# Patient Record
Sex: Female | Born: 1975 | Hispanic: No | Marital: Married | Smoking: Never smoker
Health system: Southern US, Community
[De-identification: ages and names within clinical notes are randomized; demographics above are authoritative.]

## PROBLEM LIST (undated history)

## (undated) DIAGNOSIS — E039 Hypothyroidism, unspecified: Secondary | ICD-10-CM

## (undated) DIAGNOSIS — T8859XA Other complications of anesthesia, initial encounter: Secondary | ICD-10-CM

## (undated) HISTORY — PX: REDUCTION MAMMAPLASTY: SUR839

## (undated) HISTORY — PX: OTHER SURGICAL HISTORY: SHX169

## (undated) HISTORY — PX: NASAL SEPTUM SURGERY: SHX37

## (undated) HISTORY — PX: MASTECTOMY: SHX3

---

## 2008-11-24 ENCOUNTER — Encounter: Admission: RE | Admit: 2008-11-24 | Discharge: 2008-12-14 | Payer: Self-pay | Admitting: Internal Medicine

## 2009-01-12 ENCOUNTER — Ambulatory Visit (HOSPITAL_COMMUNITY): Admission: RE | Admit: 2009-01-12 | Discharge: 2009-01-12 | Payer: Self-pay | Admitting: Gynecology

## 2009-01-12 IMAGING — RF DG HYSTEROGRAM
7 series · 7 of 7 positions shown · IV contrast (omnipaque)
Comparison: none

CLINICAL DATA: Infertility

HYSTEROSALPINGOGRAM
TECHNIQUE: Following cleansing of the cervix and vagina with
Betadine solution, a hysterosalpingogram was performed using a 5-
French hysterosalpingogram catheter and Omnipaque 300 contrast. The
patient tolerated the examination without difficulty. The exam was
performed by Dr. GITA SAKHE, and seven images are submitted for
interpretation.

[Series 1: run · 1 of 1 slices shown (1 of 7)]
[im 1/1]
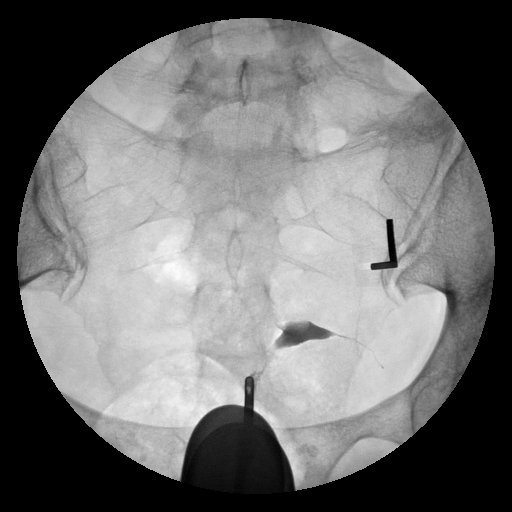

[Series 2: run · 1 of 1 slices shown (2 of 7)]
[im 1/1]
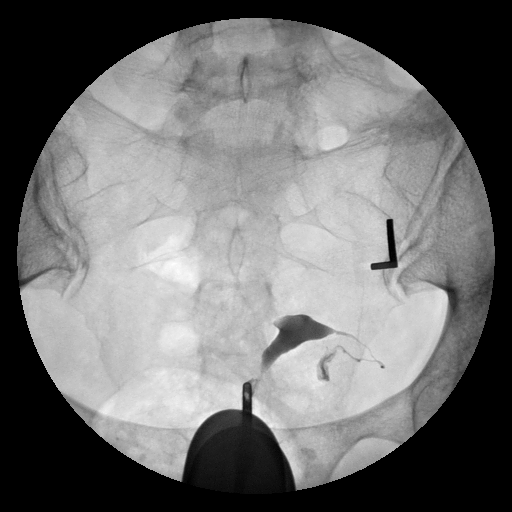

[Series 3: run · 1 of 1 slices shown (3 of 7)]
[im 1/1]
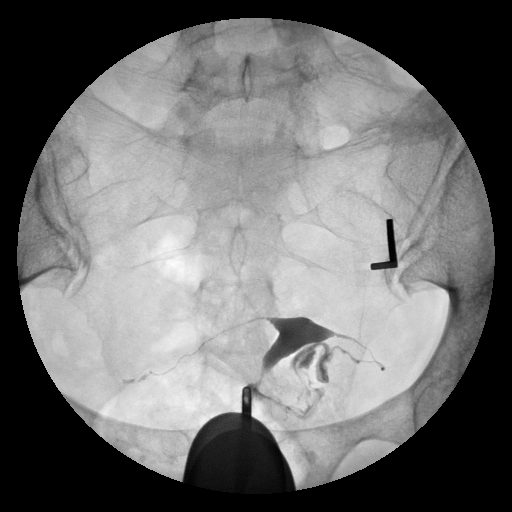

[Series 4: run · 1 of 1 slices shown (4 of 7)]
[im 1/1]
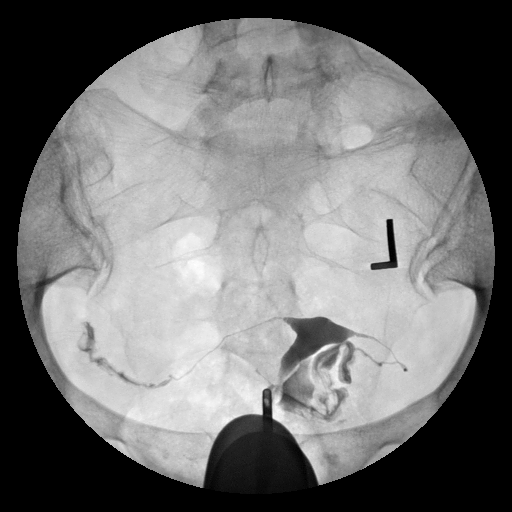

[Series 5: run · 1 of 1 slices shown (5 of 7)]
[im 1/1]
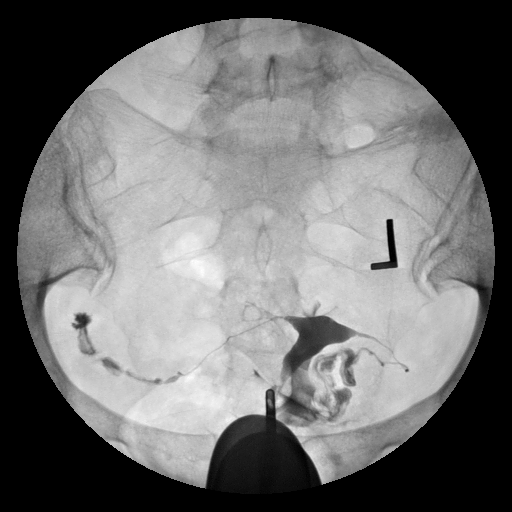

[Series 6: run · 1 of 1 slices shown (6 of 7)]
[im 1/1]
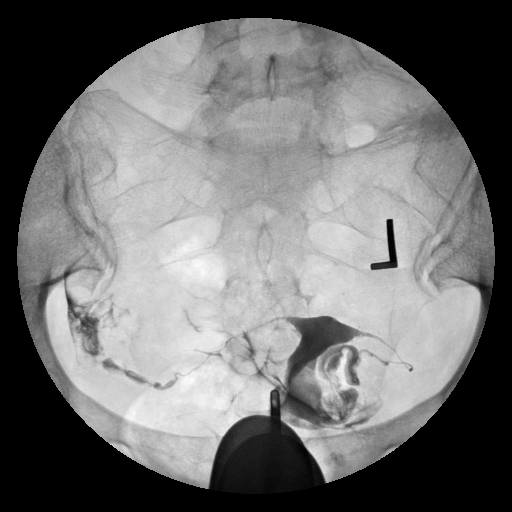

[Series 7: run · 1 of 1 slices shown (7 of 7)]
[im 1/1]
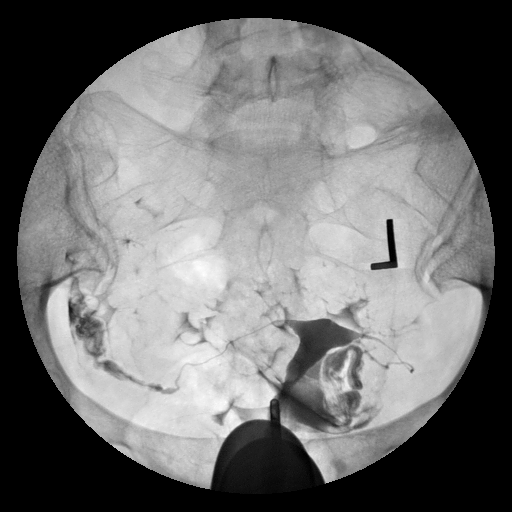

[7 of 7 positions shown; findings below may reference images not displayed]

FINDINGS: There are no fixed filling defects within the uterine
cavity.  Both fallopian tubes have a normal appearance.  There is
free spill of contrast into both adnexal regions.
IMPRESSION: Normal HSG.

## 2009-10-01 ENCOUNTER — Encounter: Admission: RE | Admit: 2009-10-01 | Discharge: 2009-10-01 | Payer: Self-pay | Admitting: Obstetrics and Gynecology

## 2009-10-01 IMAGING — MG MM SCREEN MAMMOGRAM BILATERAL
4 series · 4 of 4 positions shown · non-contrast
Comparison: none

DG SCREEN MAMMOGRAM BILATERAL
Bilateral CC and MLO view(s) were taken.

DIGITAL SCREENING MAMMOGRAM WITH CAD:
The breast tissue is heterogeneously dense.  No masses or malignant type calcifications are 
identified.
Images were processed with CAD.

[R CC]
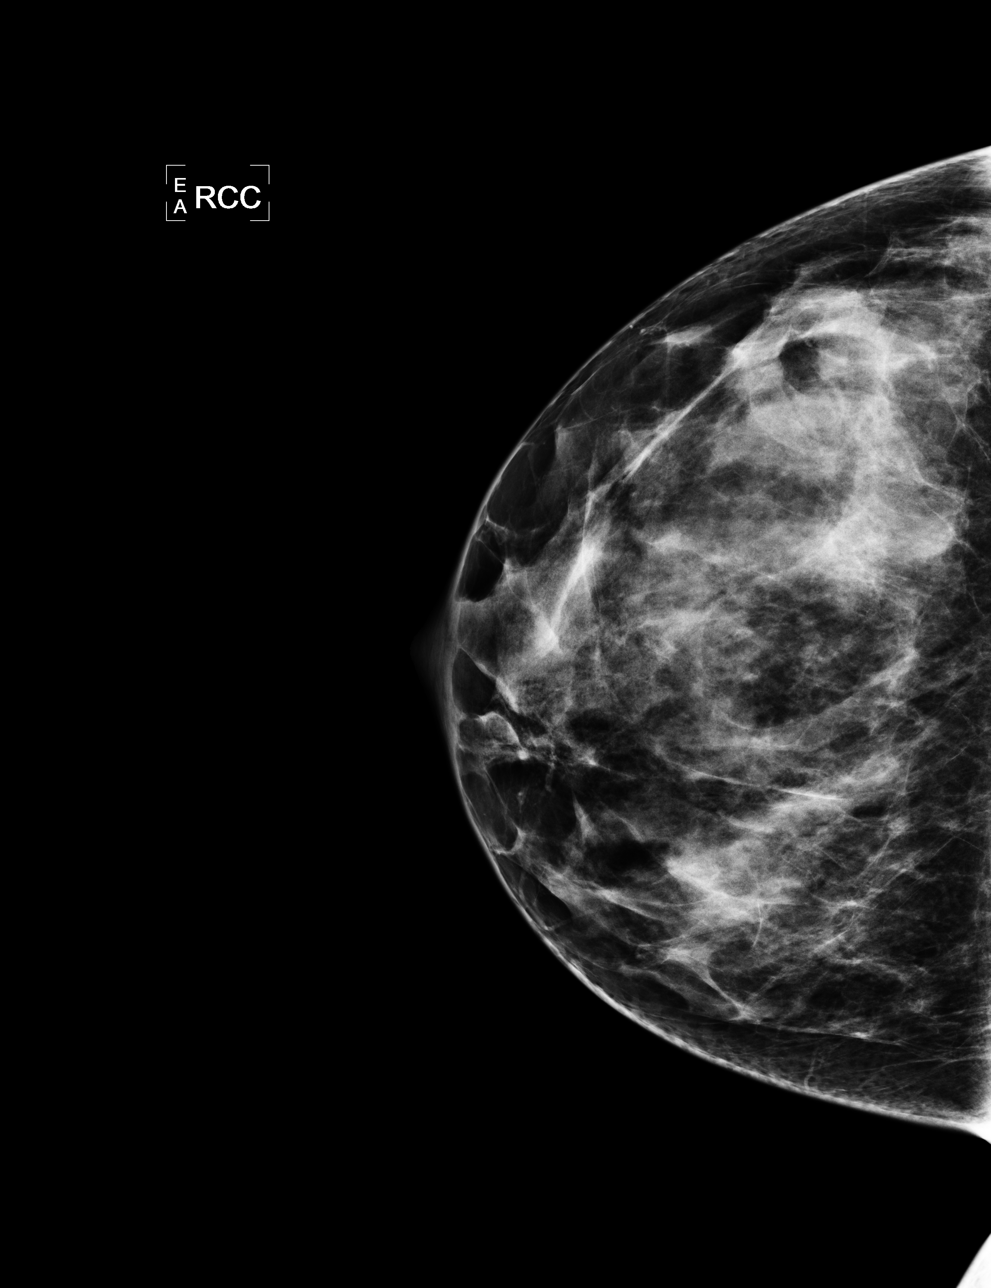

[L CC]
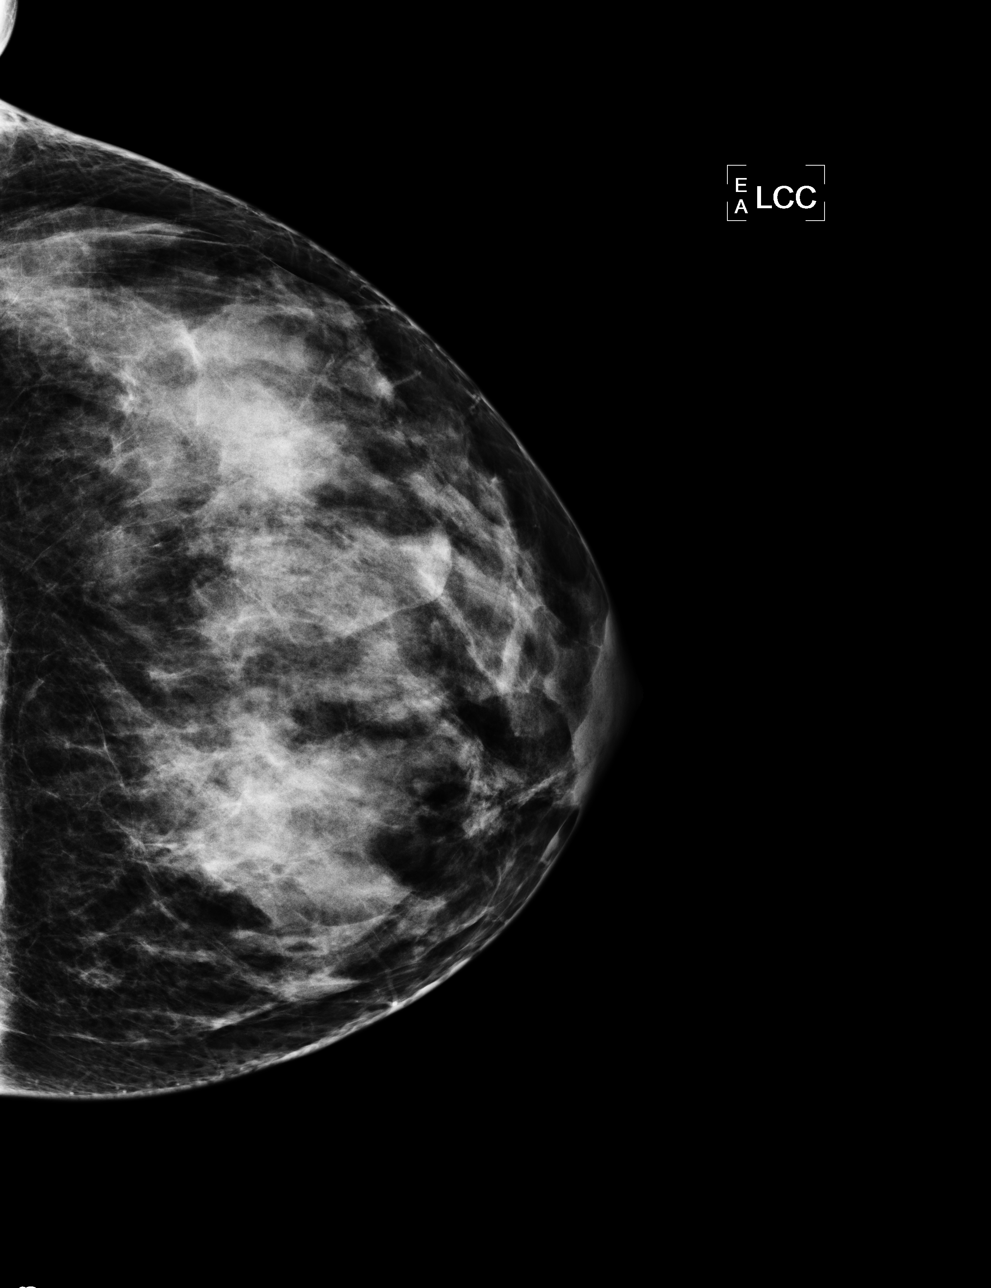

[L MLO]
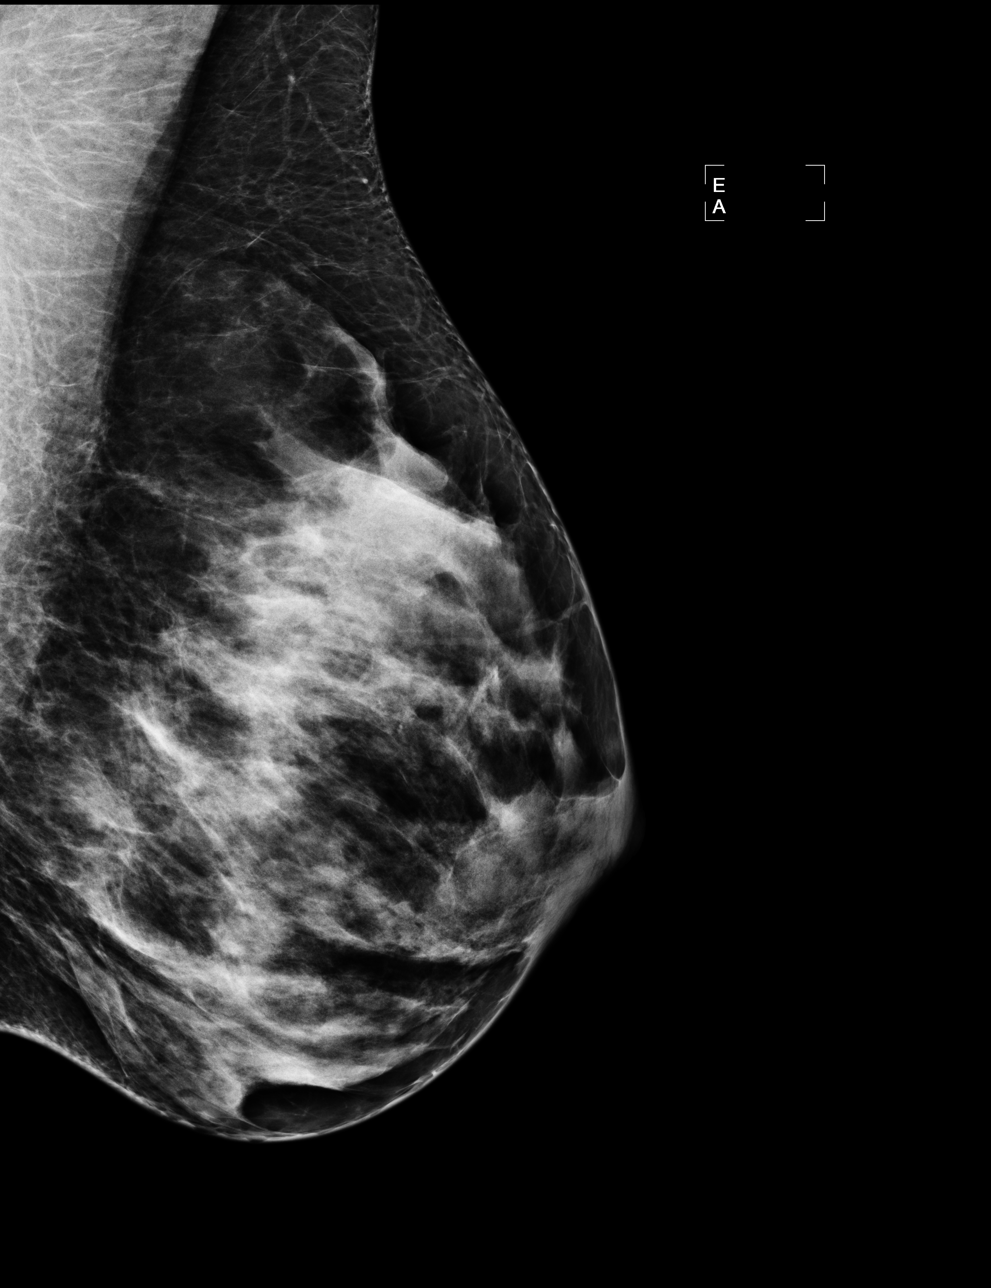

[R MLO]
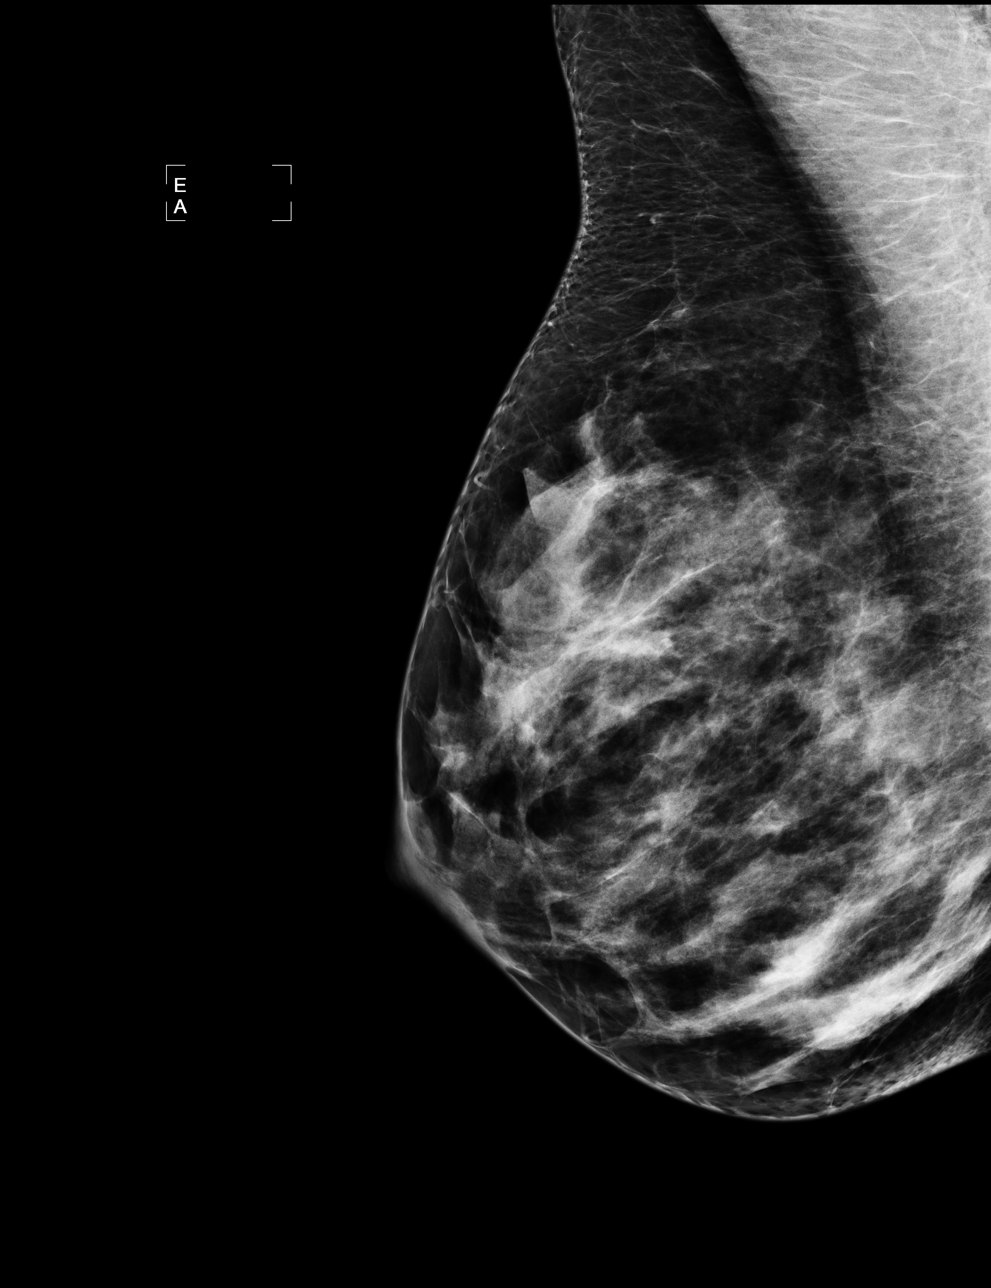

[4 of 4 positions shown; findings below may reference images not displayed]

IMPRESSION: No specific mammographic evidence of malignancy.  Next screening mammogram is recommended in one 
year.

A result letter of this screening mammogram will be mailed directly to the patient.

ASSESSMENT: Negative - BI-RADS 1

Screening mammogram in 1 year.
,

## 2010-08-10 ENCOUNTER — Ambulatory Visit (HOSPITAL_COMMUNITY): Admission: RE | Admit: 2010-08-10 | Discharge: 2010-08-10 | Payer: Self-pay | Admitting: Obstetrics and Gynecology

## 2010-10-12 ENCOUNTER — Encounter: Admission: RE | Admit: 2010-10-12 | Discharge: 2010-10-12 | Payer: Self-pay | Admitting: Obstetrics and Gynecology

## 2010-10-12 IMAGING — MG MM DIGITAL SCREENING
4 series · 4 of 4 positions shown · non-contrast
Comparison: none

DG SCREEN MAMMOGRAM BILATERAL
Bilateral CC and MLO view(s) were taken.

DIGITAL SCREENING MAMMOGRAM WITH CAD:
The breast tissue is extremely dense.  No masses or malignant type calcifications are identified.  
Compared with prior studies.
Images were processed with CAD.

[R CC]
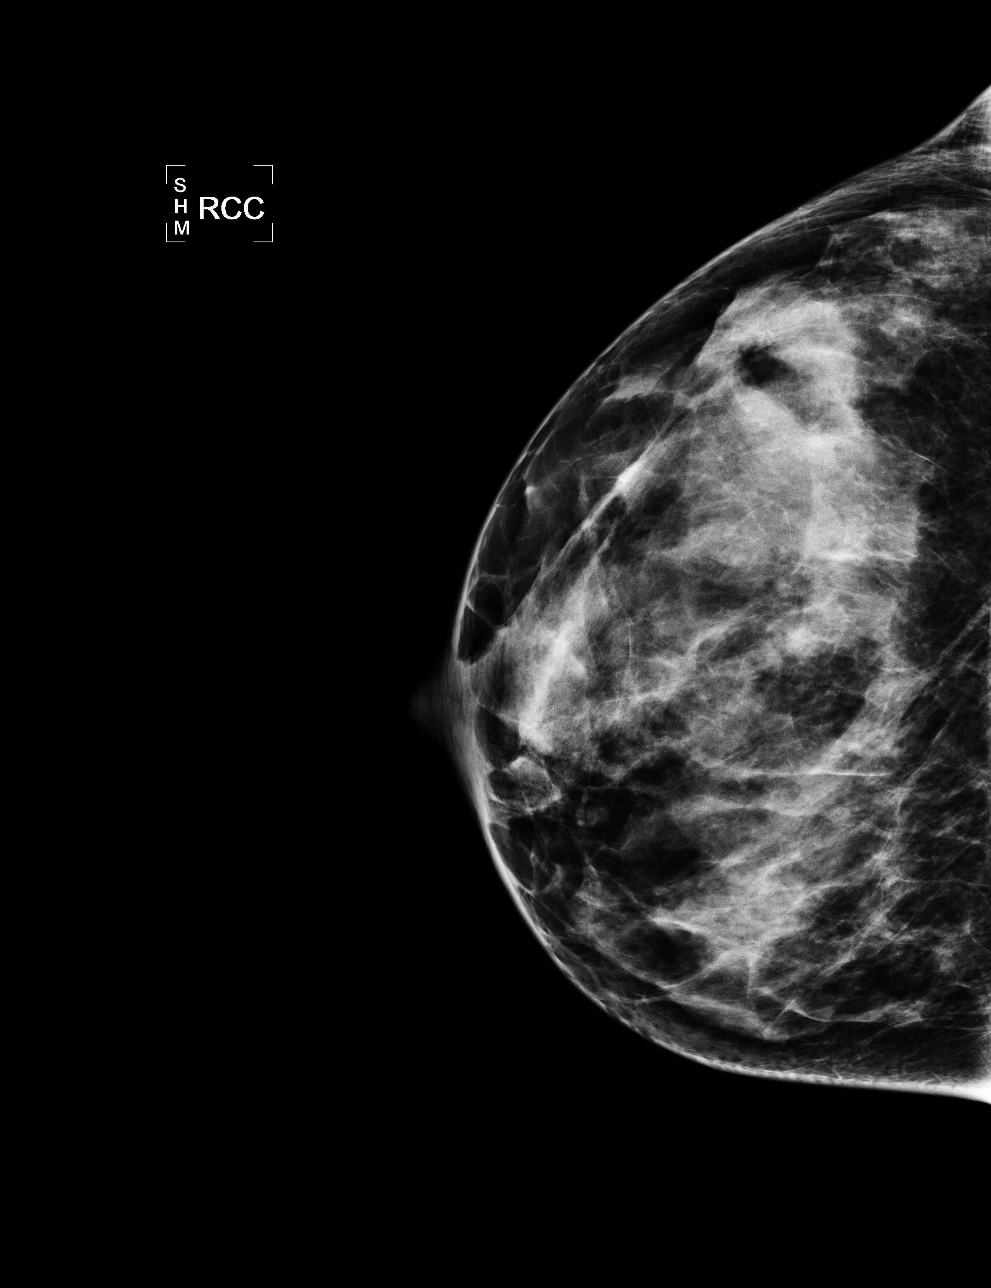

[L CC]
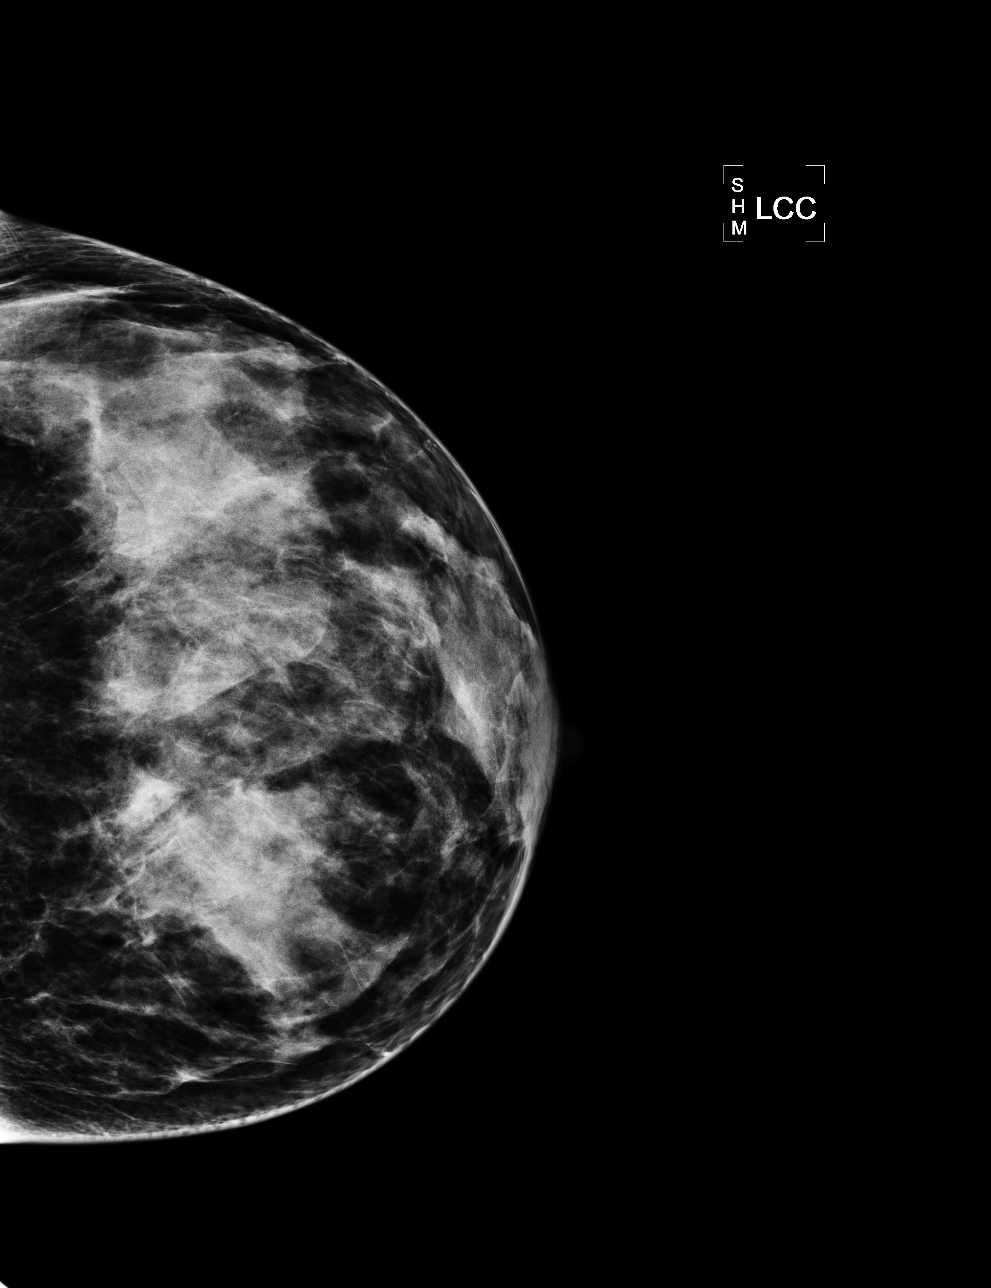

[L MLO]
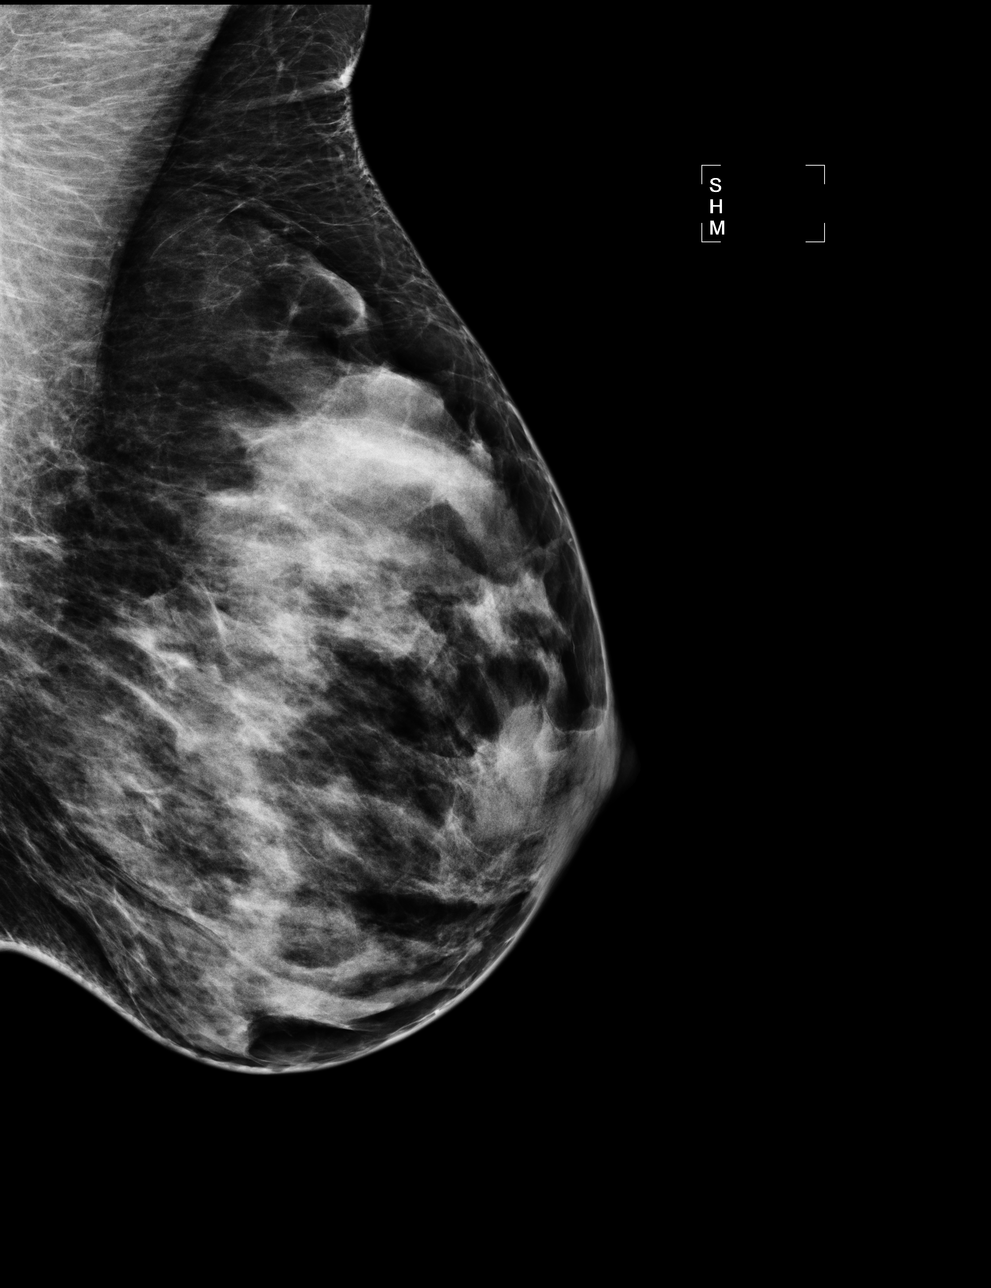

[R MLO]
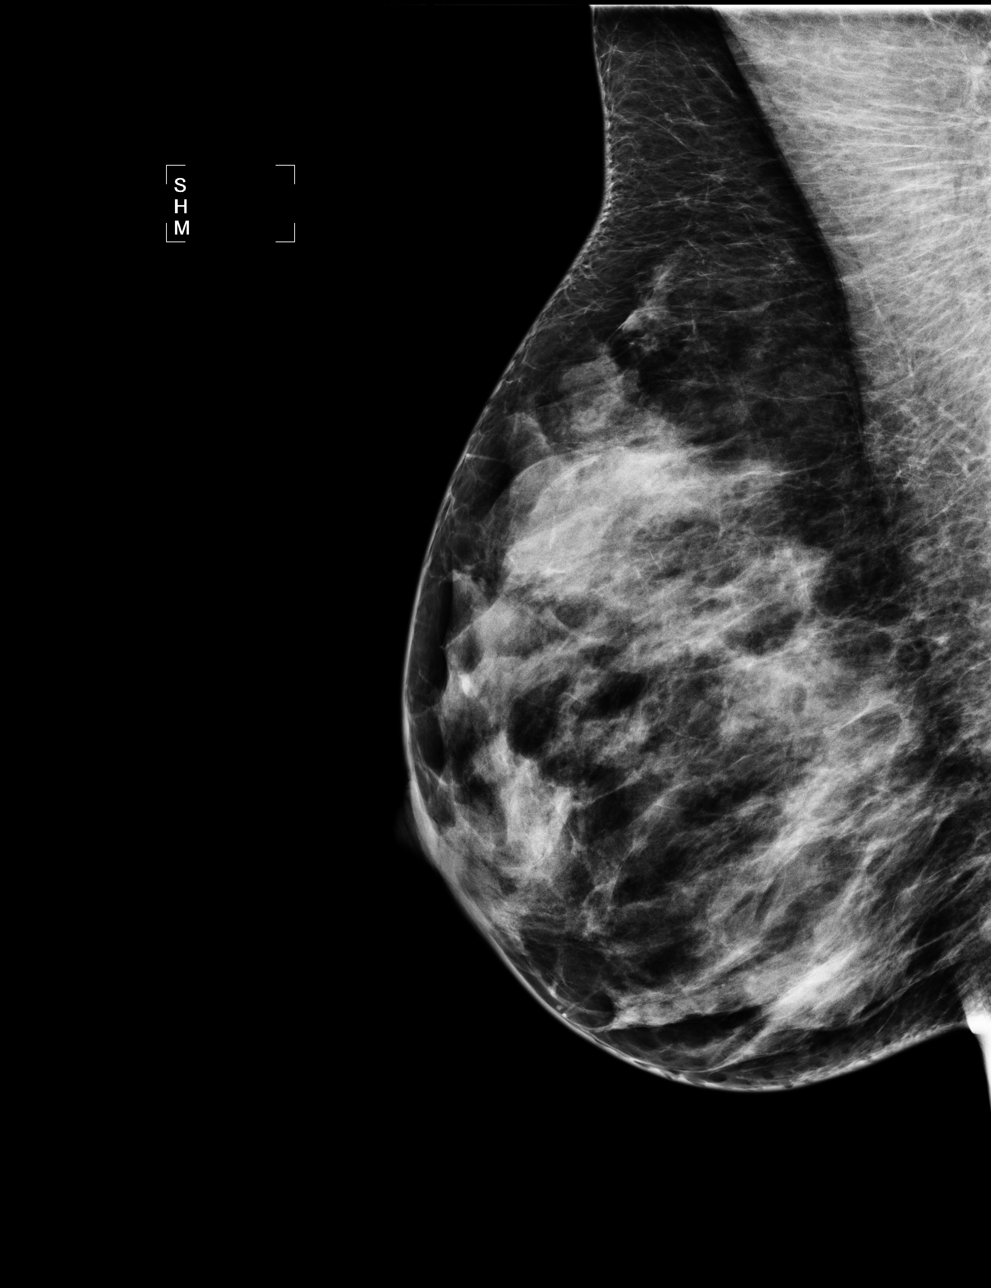

[4 of 4 positions shown; findings below may reference images not displayed]

IMPRESSION: No specific mammographic evidence of malignancy.  Next screening mammogram is recommended in one 
year.  Consider screening MRI every 1-2 years given the patient's high risk for breast cancer.

A result letter of this screening mammogram will be mailed directly to the patient.

ASSESSMENT: Negative - BI-RADS 1

Screening mammogram in 1 year.
,

## 2010-12-18 NOTE — L&D Delivery Note (Signed)
Delivery Note         At 12:36 PM a viable female was delivered via Vaginal, Vacuum (Extractor) (Presentation: Left Occiput Anterior).  APGAR: 9, 9; weight 8 lb 1.1 oz (3660 g).   Placenta status: , Manual removal.  Cord: 3 vessels with the following complications: Marland Kitchen Vacuum extractor due to maternal exhaustion  vtx at +3  Risk discussed including scalp hematoma or intracranial bleed.  vtx delivered easily with the second ctx used Kiwi..  Cord separated placenta manually removed.  Manual exploration of uterus reveal it to be clear.  Anesthesia: Epidural  Episiotomy: None Lacerations: 2nd degree;Perineal Suture Repair: chromic Est. Blood Loss (mL):   Mom to postpartum.  Baby to nursery-stable.  Dredyn Gubbels S 08/13/2011, 1:08 PM

## 2011-01-20 LAB — ABO/RH: RH Type: POSITIVE

## 2011-03-03 LAB — CBC
Hemoglobin: 13.5 g/dL (ref 12.0–15.0)
MCH: 33.5 pg (ref 26.0–34.0)
Platelets: 256 10*3/uL (ref 150–400)
RDW: 12.3 % (ref 11.5–15.5)
WBC: 4.3 10*3/uL (ref 4.0–10.5)

## 2011-03-03 LAB — HCG, SERUM, QUALITATIVE: Preg, Serum: NEGATIVE

## 2011-08-12 ENCOUNTER — Encounter (HOSPITAL_COMMUNITY): Payer: Self-pay | Admitting: *Deleted

## 2011-08-12 ENCOUNTER — Inpatient Hospital Stay (HOSPITAL_COMMUNITY)
Admission: AD | Admit: 2011-08-12 | Discharge: 2011-08-15 | DRG: 372 | Disposition: A | Discharge status: Home / Self Care | Payer: BC Managed Care – PPO | Source: Ambulatory Visit | Attending: Obstetrics and Gynecology | Admitting: Obstetrics and Gynecology

## 2011-08-12 DIAGNOSIS — E039 Hypothyroidism, unspecified: Secondary | ICD-10-CM | POA: Diagnosis present

## 2011-08-12 DIAGNOSIS — O09519 Supervision of elderly primigravida, unspecified trimester: Secondary | ICD-10-CM | POA: Diagnosis present

## 2011-08-12 DIAGNOSIS — E079 Disorder of thyroid, unspecified: Secondary | ICD-10-CM | POA: Diagnosis present

## 2011-08-12 HISTORY — DX: Hypothyroidism, unspecified: E03.9

## 2011-08-12 NOTE — Progress Notes (Signed)
Pt, " I have had contractions since 4 pm, and they haven't let up. I've been having chills, but my temp at home was 98.8."

## 2011-08-13 ENCOUNTER — Encounter (HOSPITAL_COMMUNITY): Payer: Self-pay | Admitting: *Deleted

## 2011-08-13 ENCOUNTER — Inpatient Hospital Stay (HOSPITAL_COMMUNITY): Payer: BC Managed Care – PPO | Admitting: Anesthesiology

## 2011-08-13 ENCOUNTER — Encounter (HOSPITAL_COMMUNITY): Payer: Self-pay | Admitting: Anesthesiology

## 2011-08-13 LAB — CBC
Hemoglobin: 13.9 g/dL (ref 12.0–15.0)
MCH: 33.2 pg (ref 26.0–34.0)
MCV: 98.8 fL (ref 78.0–100.0)
RBC: 4.19 MIL/uL (ref 3.87–5.11)

## 2011-08-13 MED ORDER — DIPHENHYDRAMINE HCL 25 MG PO CAPS: 25.0000 mg | ORAL_CAPSULE | Freq: Four times a day (QID) | ORAL | Status: DC | PRN

## 2011-08-13 MED ORDER — OXYCODONE-ACETAMINOPHEN 5-325 MG PO TABS
1.0000 | ORAL_TABLET | ORAL | Status: DC | PRN
  Filled 2011-08-13 (×4): qty 2

## 2011-08-13 MED ORDER — ONDANSETRON HCL 4 MG/2ML IJ SOLN
4.0000 mg | Freq: Four times a day (QID) | INTRAMUSCULAR | Status: DC | PRN
  Administered 2011-08-13: 4 mg via INTRAVENOUS
  Filled 2011-08-13 (×2): qty 2

## 2011-08-13 MED ORDER — PRENATAL PLUS 27-1 MG PO TABS
1.0000 | ORAL_TABLET | Freq: Every day | ORAL | Status: DC
  Administered 2011-08-13 – 2011-08-15 (×3): 1 via ORAL
  Filled 2011-08-13 (×3): qty 1

## 2011-08-13 MED ORDER — OXYCODONE-ACETAMINOPHEN 5-325 MG PO TABS: 2.0000 | ORAL_TABLET | ORAL | Status: DC | PRN

## 2011-08-13 MED ORDER — DIPHENHYDRAMINE HCL 50 MG/ML IJ SOLN: 12.5000 mg | INTRAMUSCULAR | Status: DC | PRN

## 2011-08-13 MED ORDER — EPHEDRINE 5 MG/ML INJ
10.0000 mg | INTRAVENOUS | Status: DC | PRN
  Filled 2011-08-13 (×2): qty 4

## 2011-08-13 MED ORDER — PHENYLEPHRINE 40 MCG/ML (10ML) SYRINGE FOR IV PUSH (FOR BLOOD PRESSURE SUPPORT)
80.0000 ug | PREFILLED_SYRINGE | INTRAVENOUS | Status: DC | PRN
  Filled 2011-08-13 (×2): qty 5

## 2011-08-13 MED ORDER — FLEET ENEMA 7-19 GM/118ML RE ENEM: 1.0000 | ENEMA | RECTAL | Status: DC | PRN

## 2011-08-13 MED ORDER — LIDOCAINE HCL 1.5 % IJ SOLN
INTRAMUSCULAR | Status: DC | PRN
  Administered 2011-08-13: 5 mL via EPIDURAL
  Administered 2011-08-13: 2 mL via EPIDURAL
  Administered 2011-08-13: 5 mL via EPIDURAL

## 2011-08-13 MED ORDER — LIDOCAINE HCL (PF) 1 % IJ SOLN
30.0000 mL | INTRAMUSCULAR | Status: AC | PRN
  Administered 2011-08-13: 30 mL via SUBCUTANEOUS
  Filled 2011-08-13: qty 30

## 2011-08-13 MED ORDER — SIMETHICONE 80 MG PO CHEW: 80.0000 mg | CHEWABLE_TABLET | ORAL | Status: DC | PRN

## 2011-08-13 MED ORDER — OXYTOCIN 20 UNITS IN LACTATED RINGERS INFUSION - SIMPLE: 125.0000 mL/h | INTRAVENOUS | Status: DC

## 2011-08-13 MED ORDER — ONDANSETRON HCL 4 MG/2ML IJ SOLN: 4.0000 mg | INTRAMUSCULAR | Status: DC | PRN

## 2011-08-13 MED ORDER — OXYTOCIN BOLUS FROM INFUSION
500.0000 mL | Freq: Once | INTRAVENOUS | Status: AC
  Administered 2011-08-13: 500 mL via INTRAVENOUS
  Filled 2011-08-13: qty 1000
  Filled 2011-08-13: qty 500

## 2011-08-13 MED ORDER — LACTATED RINGERS IV SOLN: 500.0000 mL | Freq: Once | INTRAVENOUS | Status: DC

## 2011-08-13 MED ORDER — WITCH HAZEL-GLYCERIN EX PADS: 1.0000 "application " | MEDICATED_PAD | CUTANEOUS | Status: DC | PRN

## 2011-08-13 MED ORDER — IBUPROFEN 600 MG PO TABS
600.0000 mg | ORAL_TABLET | Freq: Four times a day (QID) | ORAL | Status: DC | PRN
  Administered 2011-08-13: 600 mg via ORAL

## 2011-08-13 MED ORDER — BISACODYL 10 MG RE SUPP: 10.0000 mg | Freq: Every day | RECTAL | Status: DC | PRN

## 2011-08-13 MED ORDER — DIBUCAINE 1 % RE OINT
1.0000 "application " | TOPICAL_OINTMENT | RECTAL | Status: DC | PRN
  Filled 2011-08-13: qty 28

## 2011-08-13 MED ORDER — BENZOCAINE-MENTHOL 20-0.5 % EX AERO
INHALATION_SPRAY | CUTANEOUS | Status: AC
  Filled 2011-08-13: qty 56

## 2011-08-13 MED ORDER — PHENYLEPHRINE 40 MCG/ML (10ML) SYRINGE FOR IV PUSH (FOR BLOOD PRESSURE SUPPORT)
80.0000 ug | PREFILLED_SYRINGE | INTRAVENOUS | Status: DC | PRN
  Filled 2011-08-13: qty 5

## 2011-08-13 MED ORDER — LEVOTHYROXINE SODIUM 25 MCG PO TABS
25.0000 ug | ORAL_TABLET | ORAL | Status: DC
  Administered 2011-08-14 – 2011-08-15 (×2): 25 ug via ORAL
  Filled 2011-08-13 (×2): qty 1

## 2011-08-13 MED ORDER — SENNOSIDES-DOCUSATE SODIUM 8.6-50 MG PO TABS
2.0000 | ORAL_TABLET | Freq: Every day | ORAL | Status: DC
  Administered 2011-08-14 (×2): 2 via ORAL

## 2011-08-13 MED ORDER — CITRIC ACID-SODIUM CITRATE 334-500 MG/5ML PO SOLN: 30.0000 mL | ORAL | Status: DC | PRN

## 2011-08-13 MED ORDER — LANOLIN HYDROUS EX OINT: TOPICAL_OINTMENT | CUTANEOUS | Status: DC | PRN

## 2011-08-13 MED ORDER — ZOLPIDEM TARTRATE 5 MG PO TABS: 5.0000 mg | ORAL_TABLET | Freq: Every evening | ORAL | Status: DC | PRN

## 2011-08-13 MED ORDER — IBUPROFEN 600 MG PO TABS
600.0000 mg | ORAL_TABLET | Freq: Four times a day (QID) | ORAL | Status: DC
  Administered 2011-08-13 – 2011-08-15 (×6): 600 mg via ORAL
  Filled 2011-08-13 (×7): qty 1

## 2011-08-13 MED ORDER — ONDANSETRON HCL 4 MG PO TABS: 4.0000 mg | ORAL_TABLET | ORAL | Status: DC | PRN

## 2011-08-13 MED ORDER — BENZOCAINE-MENTHOL 20-0.5 % EX AERO: 1.0000 "application " | INHALATION_SPRAY | CUTANEOUS | Status: DC | PRN

## 2011-08-13 MED ORDER — ACETAMINOPHEN 325 MG PO TABS: 650.0000 mg | ORAL_TABLET | ORAL | Status: DC | PRN

## 2011-08-13 MED ORDER — LACTATED RINGERS IV SOLN
INTRAVENOUS | Status: DC
  Administered 2011-08-13: 02:00:00 via INTRAVENOUS
  Administered 2011-08-13: 999 mL/h via INTRAVENOUS
  Administered 2011-08-13: 09:00:00 via INTRAVENOUS

## 2011-08-13 MED ORDER — LEVOTHYROXINE SODIUM 25 MCG PO TABS: 25.0000 ug | ORAL_TABLET | Freq: Every day | ORAL | Status: DC

## 2011-08-13 MED ORDER — LEVOTHYROXINE SODIUM 50 MCG PO TABS: 50.0000 ug | ORAL_TABLET | ORAL | Status: DC

## 2011-08-13 MED ORDER — LACTATED RINGERS IV SOLN: 500.0000 mL | INTRAVENOUS | Status: DC | PRN

## 2011-08-13 MED ORDER — EPHEDRINE 5 MG/ML INJ
10.0000 mg | INTRAVENOUS | Status: DC | PRN
  Administered 2011-08-13: 10 mg via INTRAVENOUS
  Filled 2011-08-13: qty 4

## 2011-08-13 MED ORDER — TETANUS-DIPHTH-ACELL PERTUSSIS 5-2.5-18.5 LF-MCG/0.5 IM SUSP: 0.5000 mL | Freq: Once | INTRAMUSCULAR | Status: DC

## 2011-08-13 MED ORDER — PRENATAL PLUS 27-1 MG PO TABS: 1.0000 | ORAL_TABLET | Freq: Every day | ORAL | Status: DC

## 2011-08-13 MED ORDER — FENTANYL 2.5 MCG/ML BUPIVACAINE 1/10 % EPIDURAL INFUSION (WH - ANES)
14.0000 mL/h | INTRAMUSCULAR | Status: DC
  Administered 2011-08-13 (×4): 14 mL/h via EPIDURAL
  Filled 2011-08-13 (×4): qty 60

## 2011-08-13 NOTE — Anesthesia Preprocedure Evaluation (Signed)
Anesthesia Evaluation  Name, MR# and DOB Patient awake  General Assessment Comment  Reviewed: Allergy & Precautions, H&P , NPO status , Patient's Chart, lab work & pertinent test results, reviewed documented beta blocker date and time   History of Anesthesia Complications Negative for: history of anesthetic complications  Airway Mallampati: I TM Distance: >3 FB Neck ROM: full    Dental  (+) Teeth Intact   Pulmonary  clear to auscultation  breath sounds clear to auscultation none    Cardiovascular regular Normal    Neuro/Psych Negative Neurological ROS  Negative Psych ROS  GI/Hepatic/Renal negative GI ROS  negative Liver ROS  negative Renal ROS        Endo/Other  (+) Hypothyroidism,      Abdominal   Musculoskeletal   Hematology negative hematology ROS (+)   Peds  Reproductive/Obstetrics (+) Pregnancy    Anesthesia Other Findings             Anesthesia Physical Anesthesia Plan  ASA: II  Anesthesia Plan: Epidural   Post-op Pain Management:    Induction:   Airway Management Planned:   Additional Equipment:   Intra-op Plan:   Post-operative Plan:   Informed Consent: I have reviewed the patients History and Physical, chart, labs and discussed the procedure including the risks, benefits and alternatives for the proposed anesthesia with the patient or authorized representative who has indicated his/her understanding and acceptance.     Plan Discussed with:   Anesthesia Plan Comments:         Anesthesia Quick Evaluation

## 2011-08-13 NOTE — Anesthesia Procedure Notes (Signed)
Epidural Patient location during procedure: OB Start time: 08/13/2011 1:18 AM Reason for block: procedure for pain  Staffing Performed by: anesthesiologist   Preanesthetic Checklist Completed: patient identified, site marked, surgical consent, pre-op evaluation, timeout performed, IV checked, risks and benefits discussed and monitors and equipment checked  Epidural Patient position: sitting Prep: site prepped and draped and DuraPrep Patient monitoring: continuous pulse ox and blood pressure Approach: midline Injection technique: LOR air  Needle:  Needle type: Tuohy  Needle gauge: 17 G Needle length: 9 cm Needle insertion depth: 5 cm cm Catheter type: closed end flexible Catheter size: 19 Gauge Catheter at skin depth: 10 cm Test dose: negative  Assessment Events: blood not aspirated, injection not painful, no injection resistance, negative IV test and no paresthesia  Additional Notes Discussed risk of headache, infection, bleeding, nerve injury and failed or incomplete block.  Patient voices understanding and wishes to proceed.

## 2011-08-13 NOTE — Initial Assessments (Signed)
Report received from B. Cagna, RN--assumed care--pt sleeping on left side at shift change with husband at bedside

## 2011-08-13 NOTE — H&P (Signed)
Tracy Guerra is a 35 y.o. female presenting with spontaneous onset of labor.  EDC 8/28  39+ weeks NegativeGBS Maternal Medical History:  Contractions: Onset was 1-2 hours ago.   Frequency: regular.   Perceived severity is moderate.    Fetal activity: Perceived fetal activity is normal.    Prenatal Complications - Diabetes: none.    OB History    Grav Para Term Preterm Abortions TAB SAB Ect Mult Living   1              Past Medical History  Diagnosis Date  . Hypothyroidism    Past Surgical History  Procedure Date  . Nasal septum surgery   . Vaginal polyp removed    Family History: family history is not on file. Social History:  does not have a smoking history on file. She does not have any smokeless tobacco history on file. She reports that she does not drink alcohol or use illicit drugs.  ROS  Dilation: 7.5 Effacement (%): 100 Station: +1 Exam by:: B.cagna,Rn Blood pressure 124/74, pulse 85, temperature 98.5 F (36.9 C), temperature source Oral, resp. rate 16, height 5' 4.5" (1.638 m), weight 179 lb 8 oz (81.421 kg). Maternal Exam:  Uterine Assessment: Contraction strength is moderate.  Contraction frequency is regular.   Abdomen: Patient reports no abdominal tenderness. Fundal height is c/w dates.   Estimated fetal weight is 7.5.   Fetal presentation: vertex  Introitus: Amniotic fluid character: clear.  Pelvis: adequate for delivery.   Cervix: Cervix evaluated by digital exam.     Physical Exam  Prenatal labs: ABO, Rh: B/Positive/-- (02/03 0000) Antibody: Negative (02/03 0000) Rubella:   RPR:    HBsAg: Negative (02/03 0000)  HIV: Non-reactive (02/03 0000)  GBS: Negative (08/02 0000)   Assessment/Plan: IUP at term with SOL  rouetine LAnd D   Tracy Guerra S 08/13/2011, 8:08 AM

## 2011-08-14 LAB — CBC
MCH: 32.5 pg (ref 26.0–34.0)
MCHC: 33 g/dL (ref 30.0–36.0)
Platelets: 128 10*3/uL — ABNORMAL LOW (ref 150–400)
RDW: 14.1 % (ref 11.5–15.5)

## 2011-08-14 MED ORDER — BENZOCAINE-MENTHOL 20-0.5 % EX AERO
INHALATION_SPRAY | CUTANEOUS | Status: AC
  Filled 2011-08-14: qty 56

## 2011-08-14 MED ORDER — TRAMADOL HCL 50 MG PO TABS
50.0000 mg | ORAL_TABLET | Freq: Four times a day (QID) | ORAL | Status: DC | PRN
  Administered 2011-08-14 – 2011-08-15 (×4): 50 mg via ORAL
  Filled 2011-08-14 (×4): qty 1

## 2011-08-14 NOTE — Progress Notes (Signed)
Post Partum Day 1 Subjective: up ad lib, tolerating PO and complains or perineal pain. Does not want to take Percocet. Inquired if could take Ultram   Objective: Blood pressure 96/63, pulse 64, temperature 97.5 F (36.4 C), temperature source Oral, resp. rate 20, height 5' 4.5" (1.638 m), weight 81.421 kg (179 lb 8 oz), SpO2 98.00%, unknown if currently breastfeeding.  Physical Exam:  General: alert and cooperative Lochia: appropriate Uterine Fundus: firm Perineum intact, no significant labial edema. DVT Evaluation: No evidence of DVT seen on physical exam.   Basename 08/14/11 0520 08/13/11 0023  HGB 10.6* 13.9  HCT 32.1* 41.4    Assessment/Plan: Plan for discharge tomorrow Ultram 50 mg q 4 hrs prn   LOS: 2 days   CURTIS,CAROL G 08/14/2011, 9:18 AM

## 2011-08-14 NOTE — Anesthesia Postprocedure Evaluation (Signed)
  Anesthesia Post-op Note  Patient: Tracy Guerra  Procedure(s) Performed: * No procedures listed *  Patient Location: PACU and Women's Unit  Anesthesia Type: Epidural  Level of Consciousness: awake, alert  and oriented  Airway and Oxygen Therapy: Patient Spontanous Breathing  Post-op Pain: none  Post-op Assessment: Post-op Vital signs reviewed, Patient's Cardiovascular Status Stable and Pain level controlled  Post-op Vital Signs: Reviewed and stable  Complications: No apparent anesthesia complications

## 2011-08-15 MED ORDER — IBUPROFEN 600 MG PO TABS: 600.0000 mg | ORAL_TABLET | Freq: Four times a day (QID) | ORAL | Status: AC

## 2011-08-15 MED ORDER — TRAMADOL HCL 50 MG PO TABS: 50.0000 mg | ORAL_TABLET | Freq: Four times a day (QID) | ORAL | Status: AC | PRN

## 2011-08-15 NOTE — Discharge Summary (Signed)
Obstetric Discharge Summary Reason for Admission: onset of labor Prenatal Procedures: none Intrapartum Procedures: vacuum Postpartum Procedures: none Complications-Operative and Postpartum: 2 degree perineal laceration Hemoglobin  Date Value Range Status  08/14/2011 10.6* 12.0-15.0 (g/dL) Final     DELTA CHECK NOTED     REPEATED TO VERIFY     HCT  Date Value Range Status  08/14/2011 32.1* 36.0-46.0 (%) Final    Discharge Diagnoses: Term Pregnancy-delivered  Discharge Information: Date: 08/15/2011 Activity: pelvic rest Diet: routine Medications: PNV, Ibuprophen and ultram Condition: stable Instructions: refer to practice specific booklet Discharge to: home   Newborn Data: Live born female  Birth Weight: 8 lb 1.1 oz (3660 g) APGAR: 9, 9  Home with mother.  Willy Vorce G 08/15/2011, 9:34 AM

## 2011-08-15 NOTE — Progress Notes (Signed)
Post Partum Day 2 Subjective: no complaints, up ad lib, voiding, tolerating PO and + flatus  Objective: Blood pressure 123/83, pulse 67, temperature 97.7 F (36.5 C), temperature source Oral, resp. rate 18, height 5' 4.5" (1.638 m), weight 81.421 kg (179 lb 8 oz), SpO2 98.00%, unknown if currently breastfeeding.  Physical Exam:  General: alert and cooperative Lochia: appropriate Uterine Fundus: firm Perineum intact DVT Evaluation: No evidence of DVT seen on physical exam.   Basename 08/14/11 0520 08/13/11 0023  HGB 10.6* 13.9  HCT 32.1* 41.4    Assessment/Plan: Discharge home   LOS: 3 days   Velinda Wrobel G 08/15/2011, 9:26 AM

## 2011-08-20 ENCOUNTER — Inpatient Hospital Stay (HOSPITAL_COMMUNITY)
Admission: AD | Admit: 2011-08-20 | Discharge: 2011-08-20 | Disposition: A | Discharge status: Home / Self Care | Payer: BC Managed Care – PPO | Source: Ambulatory Visit | Attending: Obstetrics and Gynecology | Admitting: Obstetrics and Gynecology

## 2011-08-20 DIAGNOSIS — E039 Hypothyroidism, unspecified: Secondary | ICD-10-CM | POA: Insufficient documentation

## 2011-08-20 DIAGNOSIS — E079 Disorder of thyroid, unspecified: Secondary | ICD-10-CM | POA: Insufficient documentation

## 2011-08-20 DIAGNOSIS — O2390 Unspecified genitourinary tract infection in pregnancy, unspecified trimester: Secondary | ICD-10-CM

## 2011-08-20 DIAGNOSIS — O99893 Other specified diseases and conditions complicating puerperium: Secondary | ICD-10-CM | POA: Insufficient documentation

## 2011-08-20 DIAGNOSIS — N390 Urinary tract infection, site not specified: Secondary | ICD-10-CM

## 2011-08-20 DIAGNOSIS — R300 Dysuria: Secondary | ICD-10-CM | POA: Insufficient documentation

## 2011-08-20 DIAGNOSIS — O99285 Endocrine, nutritional and metabolic diseases complicating the puerperium: Secondary | ICD-10-CM | POA: Insufficient documentation

## 2011-08-20 DIAGNOSIS — O8620 Urinary tract infection following delivery, unspecified: Secondary | ICD-10-CM

## 2011-08-20 LAB — URINALYSIS, ROUTINE W REFLEX MICROSCOPIC
Bilirubin Urine: NEGATIVE
Ketones, ur: NEGATIVE mg/dL
Nitrite: NEGATIVE
Urobilinogen, UA: 0.2 mg/dL (ref 0.0–1.0)

## 2011-08-20 LAB — URINE MICROSCOPIC-ADD ON

## 2011-08-20 MED ORDER — CEPHALEXIN 500 MG PO CAPS: 500.0000 mg | ORAL_CAPSULE | Freq: Three times a day (TID) | ORAL | Status: AC

## 2011-08-20 NOTE — ED Provider Notes (Signed)
History     Chief Complaint  Patient presents with  . Dysuria   HPI Tracy Guerra 35 y.o.  One week postpartum having pain with urination.  Also had 5 days without a bowel movement.  On Thursday had a painful BM and had bleeding before the bowel movement.  Wants her perineal stitches checked from a 2nd degree repair 9vaginal birth).  Is concerned she has a hemorrhoid as well.  Is breastfeeding.    OB History    Grav Para Term Preterm Abortions TAB SAB Ect Mult Living   1 1 1       1       Past Medical History  Diagnosis Date  . Hypothyroidism     Past Surgical History  Procedure Date  . Nasal septum surgery   . Vaginal polyp removed     No family history on file.  History  Substance Use Topics  . Smoking status: Not on file  . Smokeless tobacco: Not on file  . Alcohol Use: No    Allergies:  Allergies  Allergen Reactions  . Avelox (Moxifloxacin Hcl In Nacl) Hives    Prescriptions prior to admission  Medication Sig Dispense Refill  . Cholecalciferol (VITAMIN D) 2000 UNITS CAPS Take 1 capsule by mouth daily.        . Docosahexaenoic Acid (DHA COMPLETE PO) Take 1 capsule by mouth daily.        Marland Kitchen ibuprofen (ADVIL,MOTRIN) 600 MG tablet Take 1 tablet (600 mg total) by mouth every 6 (six) hours.  30 tablet  1  . levothyroxine (SYNTHROID, LEVOTHROID) 25 MCG tablet Take 25-50 mcg by mouth daily. Take 25 mcg 5 times a week and 50 mcg twice a week      . NAPROXEN PO Take 1 tablet by mouth daily.        . prenatal vitamin w/FE, FA (PRENATAL 1 + 1) 27-1 MG TABS Take 1 tablet by mouth daily.       . traMADol (ULTRAM) 50 MG tablet Take 1 tablet (50 mg total) by mouth every 6 (six) hours as needed.  30 tablet  0    Review of Systems  Constitutional: Negative for fever.  Genitourinary: Positive for dysuria.   Physical Exam   Blood pressure 128/91, pulse 77, temperature 97.9 F (36.6 C), temperature source Oral, resp. rate 20, height 5' 4.5" (1.638 m), weight 162 lb 6.4 oz  (73.664 kg), unknown if currently breastfeeding.  Physical Exam  Nursing note and vitals reviewed. Constitutional: She is oriented to person, place, and time. She appears well-developed and well-nourished.  HENT:  Head: Normocephalic.  Eyes: EOM are normal.  Neck: Neck supple.  Genitourinary:       No hemmorroids Perineum normal to visual inspection, but with palpation of tissues, outer layer in center of perineum opens and sutures visualized clustered on both sides.  Small amount of lochia seen coming from vagina.  No active bleeding from perineal repair site.  Musculoskeletal: Normal range of motion.  Neurological: She is alert and oriented to person, place, and time.  Skin: Skin is warm and dry.  Psychiatric: She has a normal mood and affect.    MAU Course  Procedures Results for orders placed during the hospital encounter of 08/20/11 (from the past 24 hour(s))  URINALYSIS, ROUTINE W REFLEX MICROSCOPIC     Status: Abnormal   Collection Time   08/20/11  1:30 AM      Component Value Range   Color, Urine YELLOW  YELLOW    Appearance CLEAR  CLEAR    Specific Gravity, Urine 1.010  1.005 - 1.030    pH 7.0  5.0 - 8.0    Glucose, UA NEGATIVE  NEGATIVE (mg/dL)   Hgb urine dipstick LARGE (*) NEGATIVE    Bilirubin Urine NEGATIVE  NEGATIVE    Ketones, ur NEGATIVE  NEGATIVE (mg/dL)   Protein, ur NEGATIVE  NEGATIVE (mg/dL)   Urobilinogen, UA 0.2  0.0 - 1.0 (mg/dL)   Nitrite NEGATIVE  NEGATIVE    Leukocytes, UA LARGE (*) NEGATIVE   URINE MICROSCOPIC-ADD ON     Status: Abnormal   Collection Time   08/20/11  1:30 AM      Component Value Range   Squamous Epithelial / LPF FEW (*) RARE    WBC, UA 21-50  <3 (WBC/hpf)   RBC / HPF 7-10  <3 (RBC/hpf)   Bacteria, UA FEW (*) RARE    Urine-Other MUCOUS PRESENT      MDM Consult with Dr. Marcelle Overlie re: plan of care. Perineum suspicious for tearing of sutures during difficult BM. Client to be seen in the office on Tuesday for further  evaluation. Urine culture pending.  Assessment and Plan  UTI  Plan: Rx Keflex 500 mg TID x 5 days Call the office on Tuesday morning and be seen in the office for further evaluation.   BURLESON,TERRI 08/20/2011, 2:57 AM   Nolene Bernheim, NP 08/20/11 [phone]

## 2011-08-20 NOTE — Progress Notes (Signed)
Written and verbal d/c instructions given and understanding voiced. 

## 2011-08-20 NOTE — ED Notes (Signed)
T.  Burleson NP in to discuss d/c plan with pt 

## 2011-08-20 NOTE — ED Notes (Signed)
Keflex 500mg  po #15 1 by mouth 3 x daily with food called to [facility] Dr. Wendi Snipes called in at pt's spouse's request.

## 2011-08-20 NOTE — Progress Notes (Signed)
G1P1 svd one wk ago. This evening noticed some burning with urination which is getting worse. Had 2 degree lac with delivery and passed very hard stool Thurs. Which caused some rectal bleeding.' I would like to have my stitches checked and rectum to see if I have hemorrhoids"

## 2011-08-20 NOTE — ED Notes (Signed)
0300 T. Burleson NP in with pt. Perineum examined and stitches used for 2 degree repair noted. Pt tol well

## 2011-08-20 NOTE — ED Notes (Signed)
Lilyan Punt NP in to see pt. Pt nursing baby. Spouse present and supportive.

## 2011-08-22 LAB — URINE CULTURE
Colony Count: 15000
Culture  Setup Time: 201209021056

## 2012-10-01 ENCOUNTER — Other Ambulatory Visit: Payer: Self-pay | Admitting: Obstetrics and Gynecology

## 2013-03-02 ENCOUNTER — Telehealth: Payer: Self-pay | Admitting: Internal Medicine

## 2013-03-02 NOTE — Telephone Encounter (Signed)
Cold on and off x 3 weeks with post viral cough. No relief with netti pot. Nose completely blocked. Feeling tired and feverish. Coughing sputum clear. Feels run down. Baby daughter sick  Sounds cattarh voice Runny nose Swollent but non inflamed tonsils Post nasal drip + Walking around and alert  A Sinusitis  P Doxy x 5 days [facility]   Dr. Kalman Shan, M.D., [facility].C.P Pulmonary and Critical Care Medicine Staff Physician [facility] Pager: [phone], If no answer or between  15:00h - 7:00h: call [phone]  03/02/2013 9:45 PM

## 2013-10-03 ENCOUNTER — Other Ambulatory Visit: Payer: Self-pay | Admitting: Obstetrics and Gynecology

## 2013-10-06 ENCOUNTER — Other Ambulatory Visit: Payer: Self-pay

## 2013-10-06 ENCOUNTER — Ambulatory Visit
Admission: RE | Admit: 2013-10-06 | Discharge: 2013-10-06 | Disposition: A | Discharge status: Home / Self Care | Payer: BC Managed Care – PPO | Source: Ambulatory Visit

## 2013-10-06 DIAGNOSIS — Z1231 Encounter for screening mammogram for malignant neoplasm of breast: Secondary | ICD-10-CM

## 2014-09-14 ENCOUNTER — Other Ambulatory Visit: Payer: Self-pay

## 2014-09-14 DIAGNOSIS — Z1231 Encounter for screening mammogram for malignant neoplasm of breast: Secondary | ICD-10-CM

## 2014-10-06 ENCOUNTER — Other Ambulatory Visit: Payer: Self-pay | Admitting: Obstetrics and Gynecology

## 2014-10-07 LAB — CYTOLOGY - PAP

## 2014-10-13 ENCOUNTER — Ambulatory Visit
Admission: RE | Admit: 2014-10-13 | Discharge: 2014-10-13 | Disposition: A | Discharge status: Home / Self Care | Payer: PRIVATE HEALTH INSURANCE | Source: Ambulatory Visit

## 2014-10-13 DIAGNOSIS — Z1231 Encounter for screening mammogram for malignant neoplasm of breast: Secondary | ICD-10-CM

## 2014-10-13 IMAGING — MG MM SCREENING BREAST TOMO BILATERAL
8 series · 8 of 24 positions shown · non-contrast
Comparison: Previous exam(s).

CLINICAL DATA: Screening.

EXAM:
DIGITAL SCREENING BILATERAL MAMMOGRAM WITH 3D TOMO WITH CAD

[R CC]
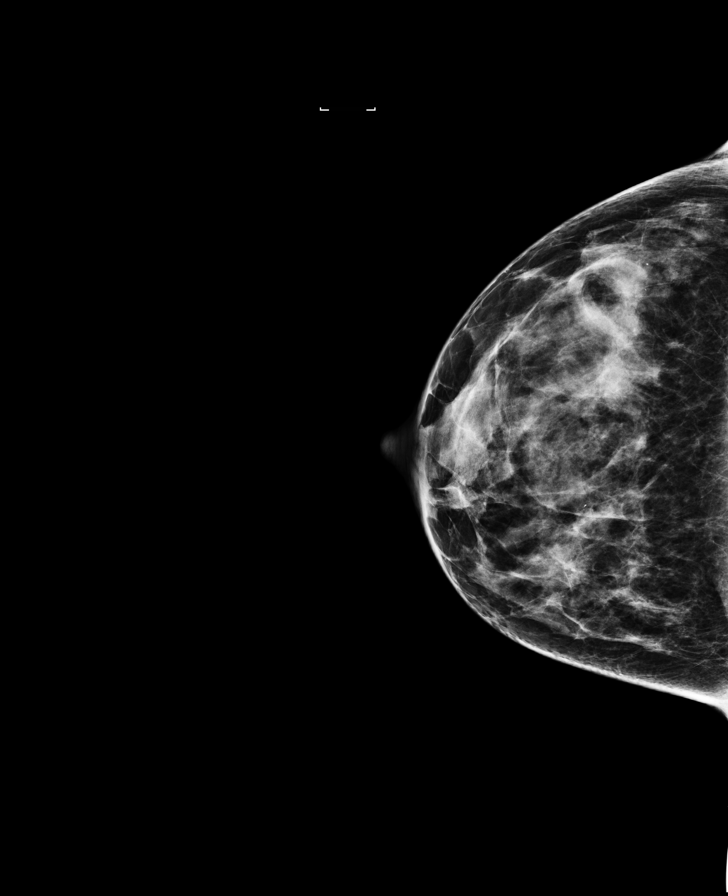

[L CC]
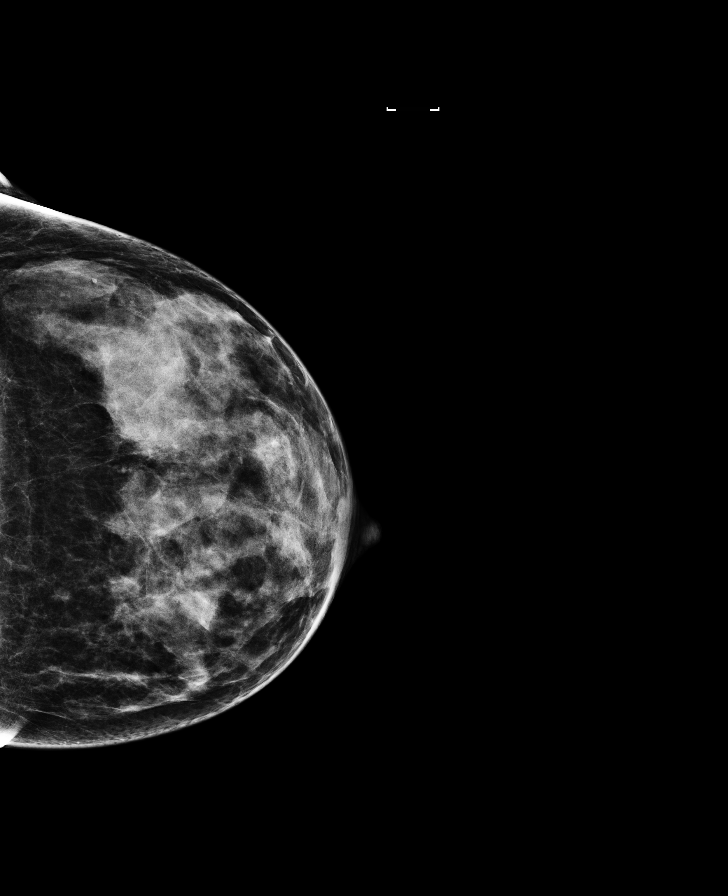

[R MLO]
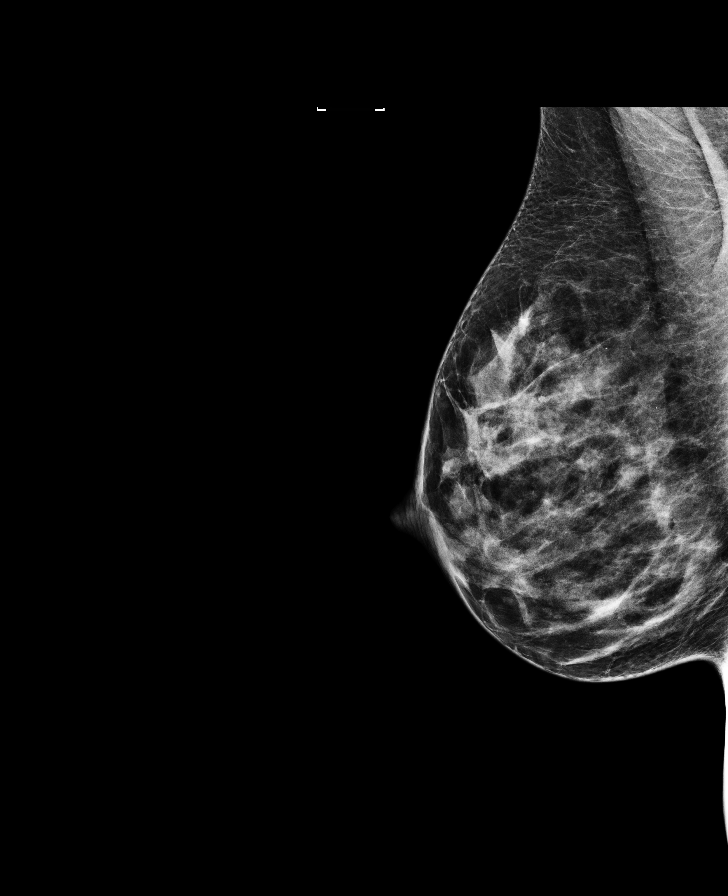

[L MLO]
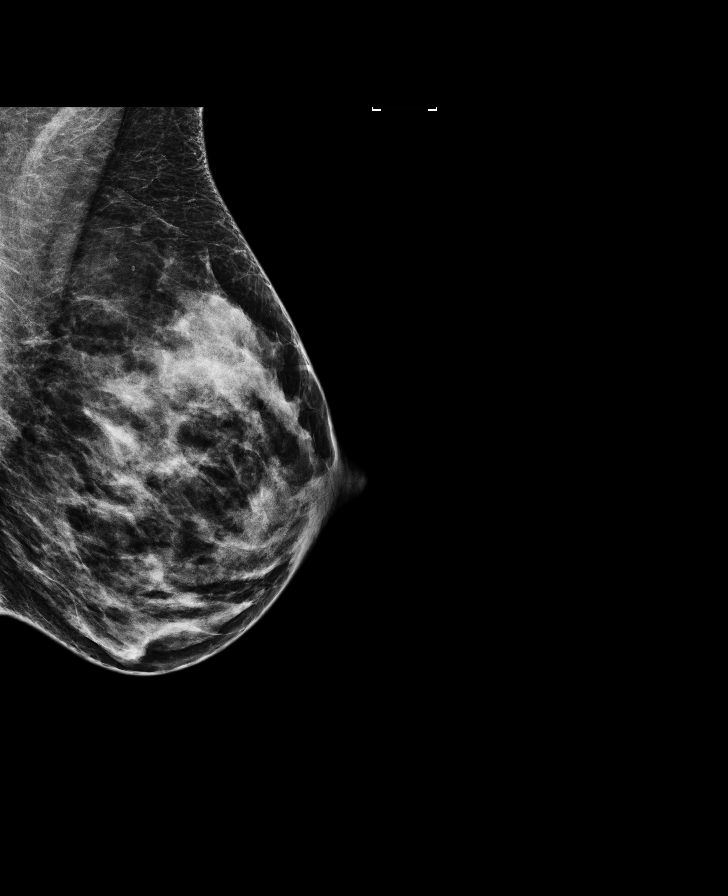

[R CC tomo · tomo slice 27/52.0]
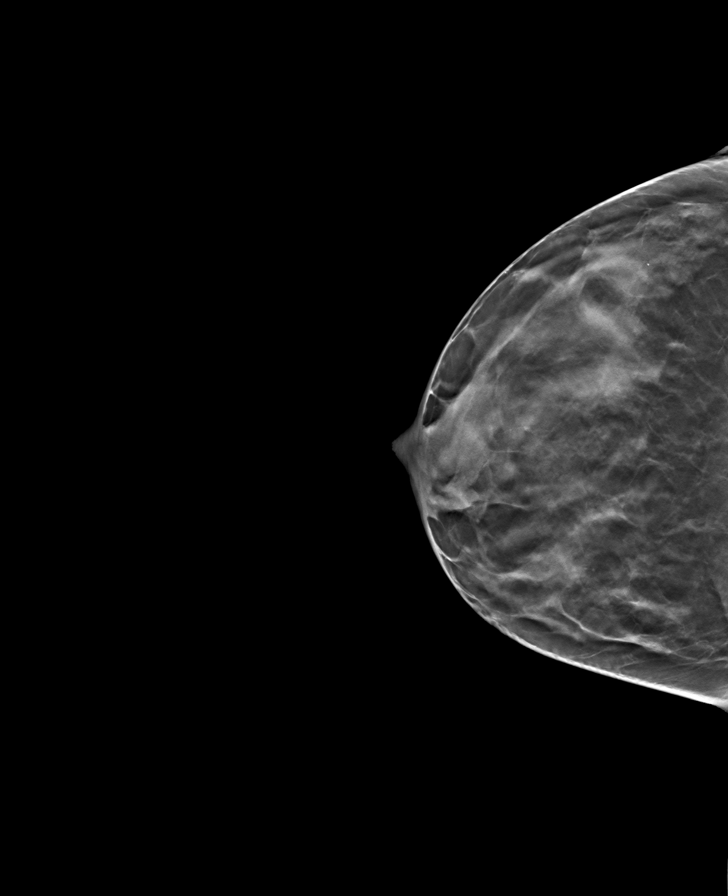

[L MLO tomo · tomo slice 30/59.0]
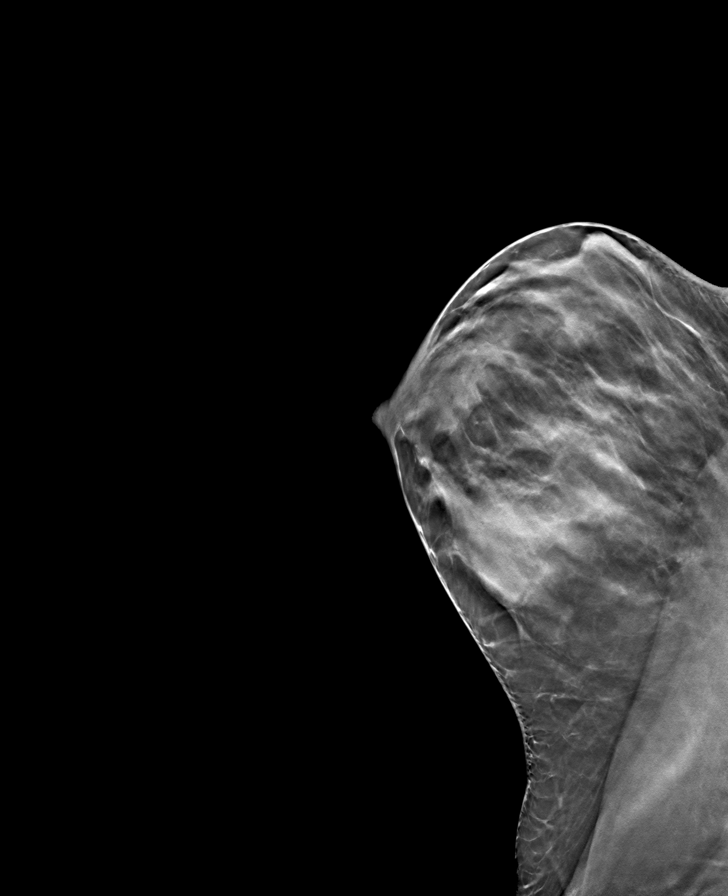

[R MLO tomo · tomo slice 29/57.0]
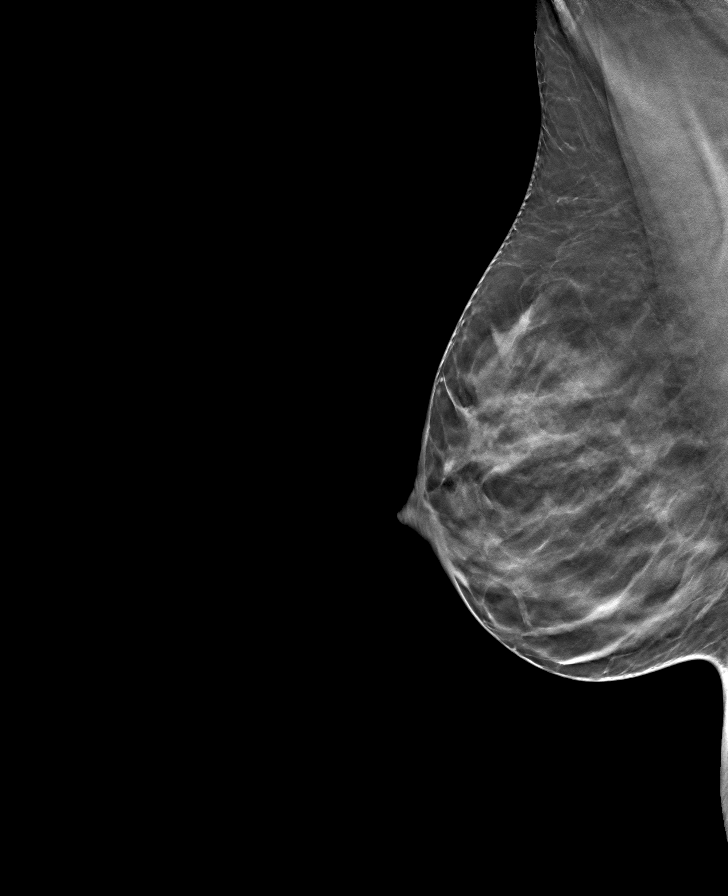

[L CC tomo · tomo slice 28/55.0]
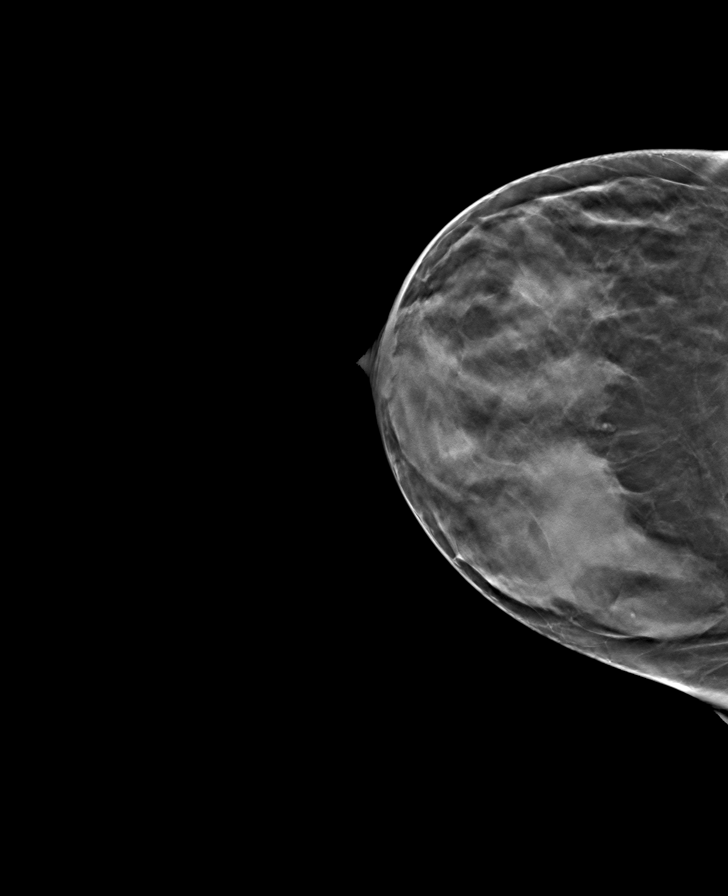

[8 of 24 positions shown; findings below may reference images not displayed]

ACR Breast Density Category c: The breast tissue is heterogeneously
dense, which may obscure small masses.
FINDINGS: There has been no significant interval change and there are no
findings suspicious for malignancy. Images were processed with CAD.
IMPRESSION: No mammographic evidence of malignancy. A result letter of this
screening mammogram will be mailed directly to the patient.

RECOMMENDATION:
Screening mammogram in one year. (Code:[ZG])

BI-RADS CATEGORY  1: Negative.

## 2014-10-19 ENCOUNTER — Encounter (HOSPITAL_COMMUNITY): Payer: Self-pay | Admitting: *Deleted

## 2015-06-30 ENCOUNTER — Other Ambulatory Visit: Payer: Self-pay | Admitting: Nurse Practitioner

## 2015-06-30 DIAGNOSIS — N644 Mastodynia: Secondary | ICD-10-CM

## 2015-07-06 ENCOUNTER — Ambulatory Visit
Admission: RE | Admit: 2015-07-06 | Discharge: 2015-07-06 | Disposition: A | Discharge status: Home / Self Care | Payer: PRIVATE HEALTH INSURANCE | Source: Ambulatory Visit | Attending: Nurse Practitioner | Admitting: Nurse Practitioner

## 2015-07-06 DIAGNOSIS — N644 Mastodynia: Secondary | ICD-10-CM

## 2015-07-06 IMAGING — MG MM DIAG BREAST TOMO UNI LEFT
5 series · 6 of 13 positions shown · non-contrast
Comparison: Previous exam(s).

CLINICAL DATA: 39-year-old female with focal pain in the
upper-outer left breast. The patient states this pain occurs
predominantly with her menstrual cycle and has been ongoing for the
past 3 months. Family history of breast cancer in the patient's
mother at age 56.

EXAM:
DIGITAL DIAGNOSTIC LEFT MAMMOGRAM WITH 3D TOMOSYNTHESIS AND CAD
LEFT BREAST ULTRASOUND

[L TAN]
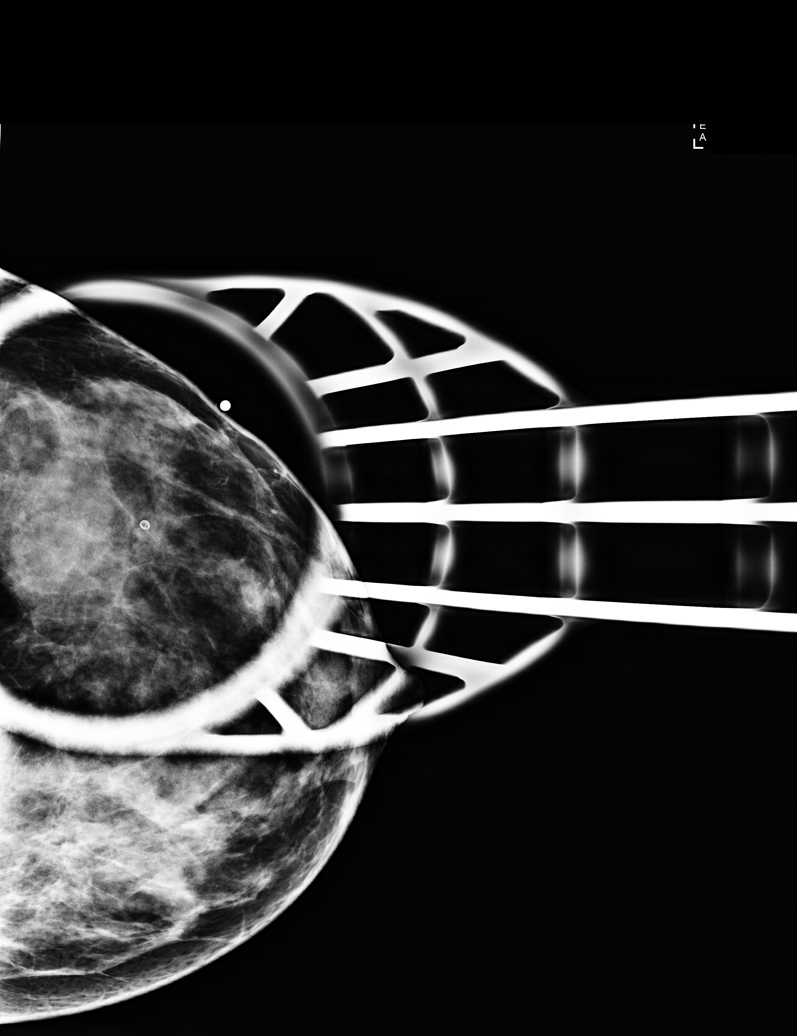

[L MLO]
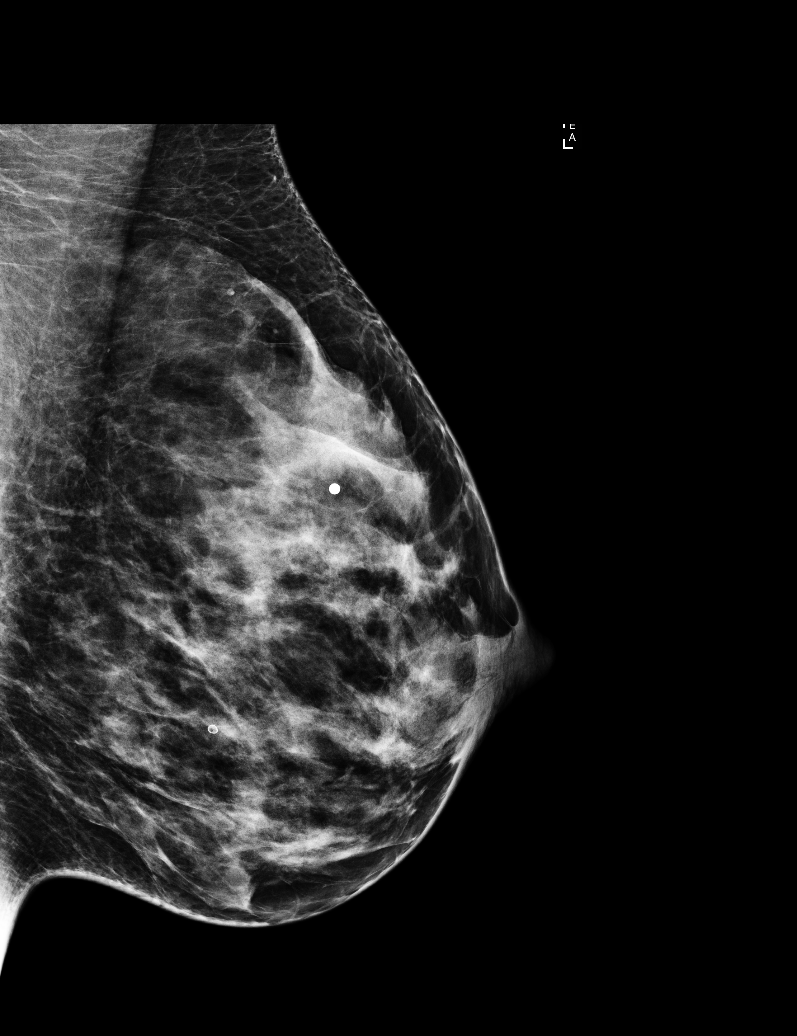

[L CC]
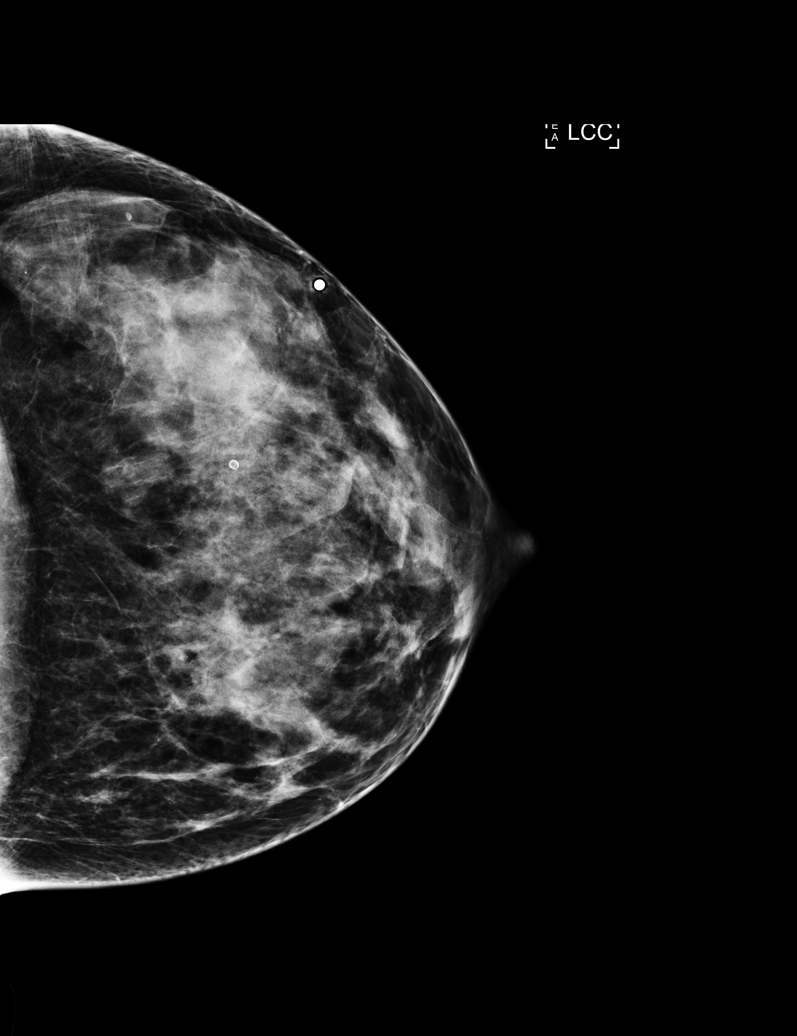

[L CC tomo · 2 of 61 frames shown]
[frame 20/61]
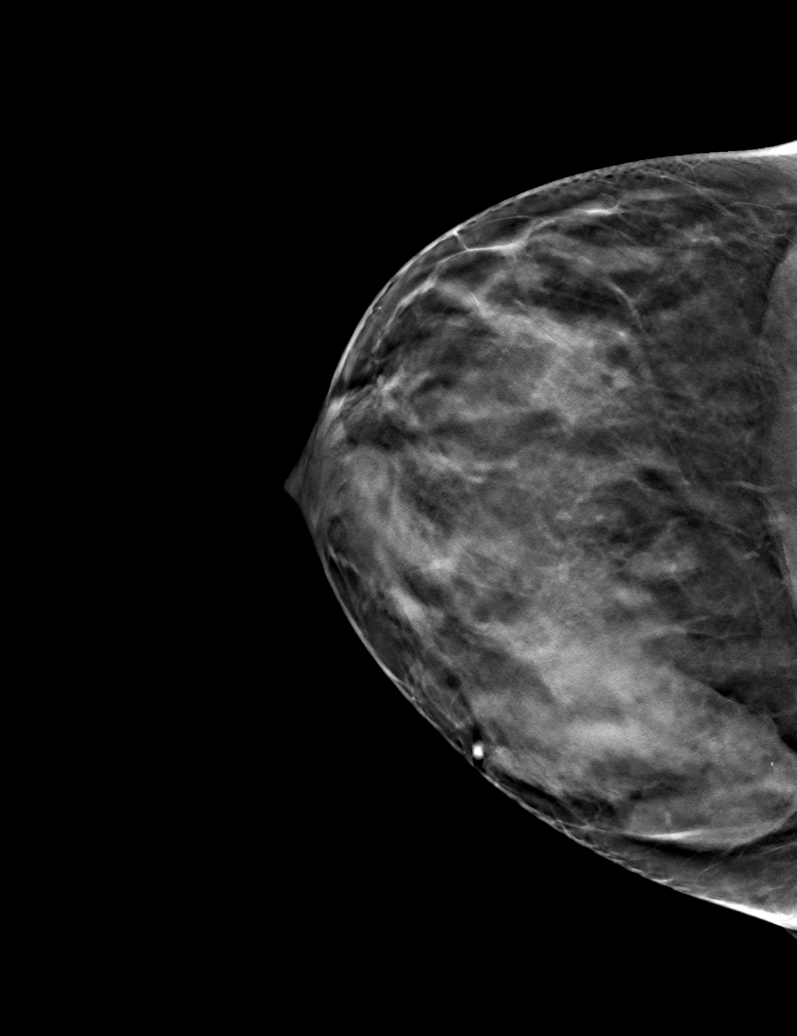
[frame 31/61]
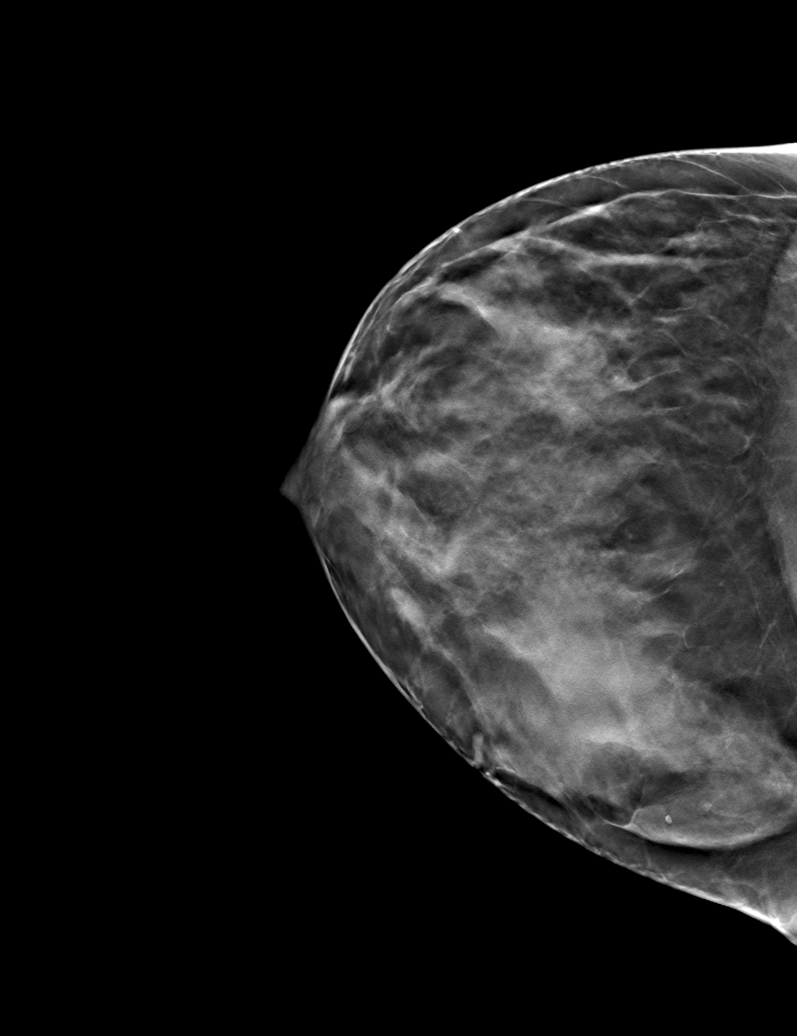

[L MLO tomo · tomo slice 31/60.0]
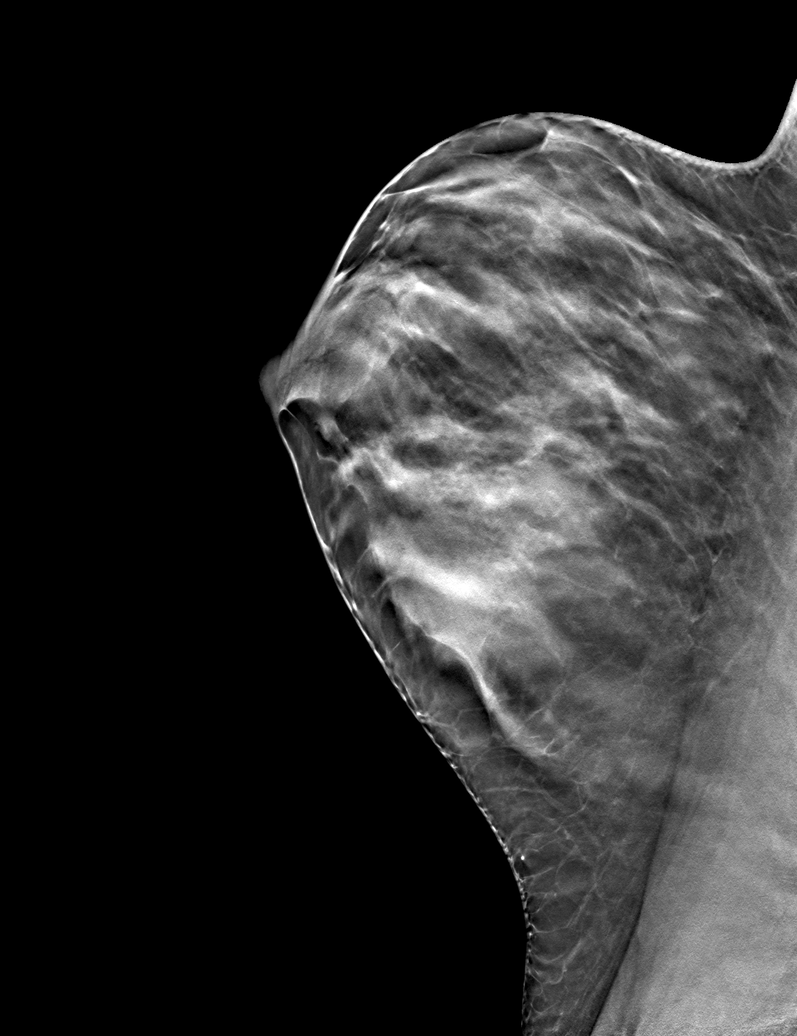

[6 of 13 positions shown; findings below may reference images not displayed]

ACR Breast Density Category c: The breast tissue is heterogeneously
dense, which may obscure small masses.
FINDINGS: No suspicious masses or calcifications are seen in the left breast.
A spot compression tangential view was performed over the painful
site of concern in the upper-outer left breast with no mammographic
abnormalities seen in this location.

Mammographic images were processed with CAD.

Physical examination of the outer left breast does not reveal any
palpable masses.

Targeted ultrasound of the left breast was performed. No discrete
masses or abnormalities are seen, only heterogeneously dense
fibroglandular tissue is visualized. The entire central and outer
left breast was scanned with no abnormality seen.
IMPRESSION: 1. No mammographic or sonographic correlate for the focal area of
pain in the outer left breast.\

2. No mammographic evidence of malignancy in the left breast.

RECOMMENDATION:
1. Recommend further management of the focal left breast pain be
based on clinical assessment.

2. Recommend annual routine screening mammography, for which the
patient will be due [DATE].

I have discussed the findings and recommendations with the patient.
Results were also provided in writing at the conclusion of the
visit. If applicable, a reminder letter will be sent to the patient
regarding the next appointment.

BI-RADS CATEGORY  1: Negative.

## 2015-09-14 ENCOUNTER — Other Ambulatory Visit: Payer: Self-pay

## 2015-09-14 DIAGNOSIS — Z1231 Encounter for screening mammogram for malignant neoplasm of breast: Secondary | ICD-10-CM

## 2015-10-19 ENCOUNTER — Ambulatory Visit: Payer: PRIVATE HEALTH INSURANCE

## 2015-11-10 ENCOUNTER — Ambulatory Visit
Admission: RE | Admit: 2015-11-10 | Discharge: 2015-11-10 | Disposition: A | Discharge status: Home / Self Care | Payer: PRIVATE HEALTH INSURANCE | Source: Ambulatory Visit

## 2015-11-10 DIAGNOSIS — Z1231 Encounter for screening mammogram for malignant neoplasm of breast: Secondary | ICD-10-CM

## 2016-10-02 ENCOUNTER — Other Ambulatory Visit: Payer: Self-pay | Admitting: Internal Medicine

## 2016-10-02 ENCOUNTER — Other Ambulatory Visit: Payer: Self-pay | Admitting: Nurse Practitioner

## 2016-10-02 DIAGNOSIS — Z1231 Encounter for screening mammogram for malignant neoplasm of breast: Secondary | ICD-10-CM

## 2016-11-14 ENCOUNTER — Ambulatory Visit
Admission: RE | Admit: 2016-11-14 | Discharge: 2016-11-14 | Disposition: A | Discharge status: Home / Self Care | Payer: PRIVATE HEALTH INSURANCE | Source: Ambulatory Visit | Attending: Internal Medicine | Admitting: Internal Medicine

## 2016-11-14 DIAGNOSIS — Z1231 Encounter for screening mammogram for malignant neoplasm of breast: Secondary | ICD-10-CM

## 2016-11-14 IMAGING — MG 2D DIGITAL SCREENING BILATERAL MAMMOGRAM WITH CAD AND ADJUNCT TO
9 of 12 series · 9 of 28 positions shown · non-contrast
Comparison: Previous exam(s).

CLINICAL DATA: Screening.

EXAM:
2D DIGITAL SCREENING BILATERAL MAMMOGRAM WITH CAD AND ADJUNCT TOMO

[R CC]
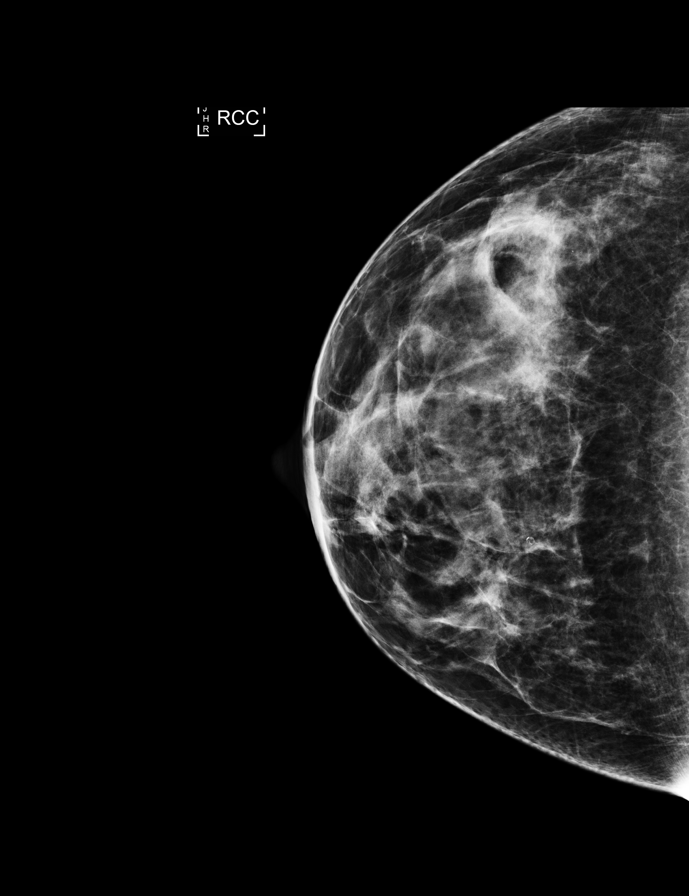

[R MLO synth-2D]
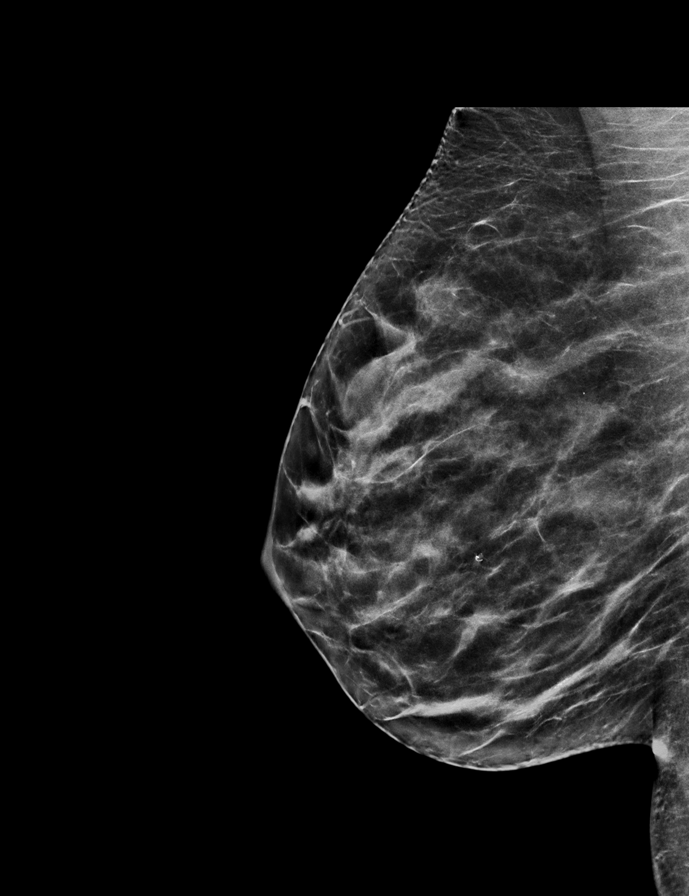

[R MLO]
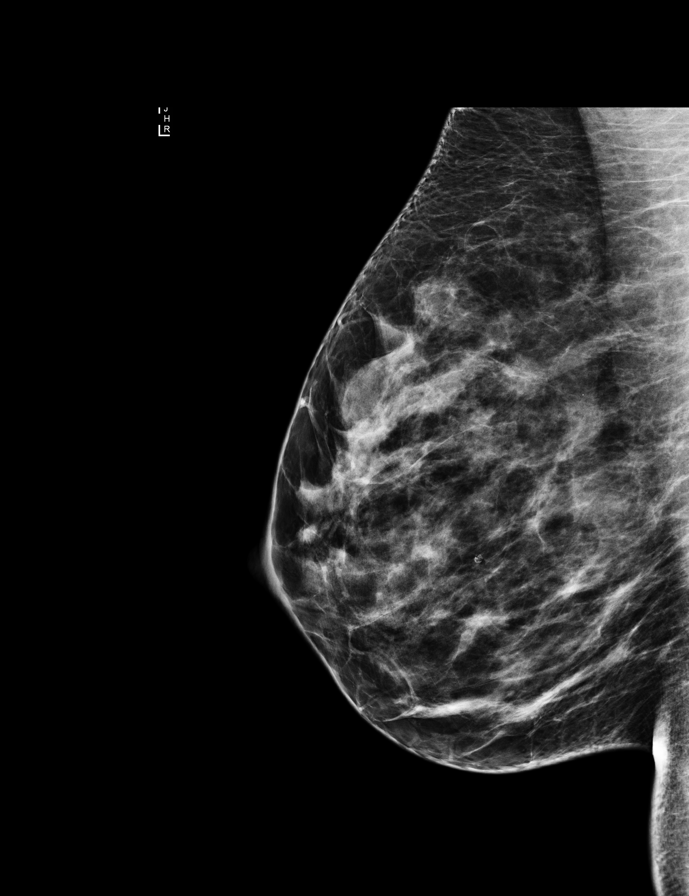

[R CC synth-2D]
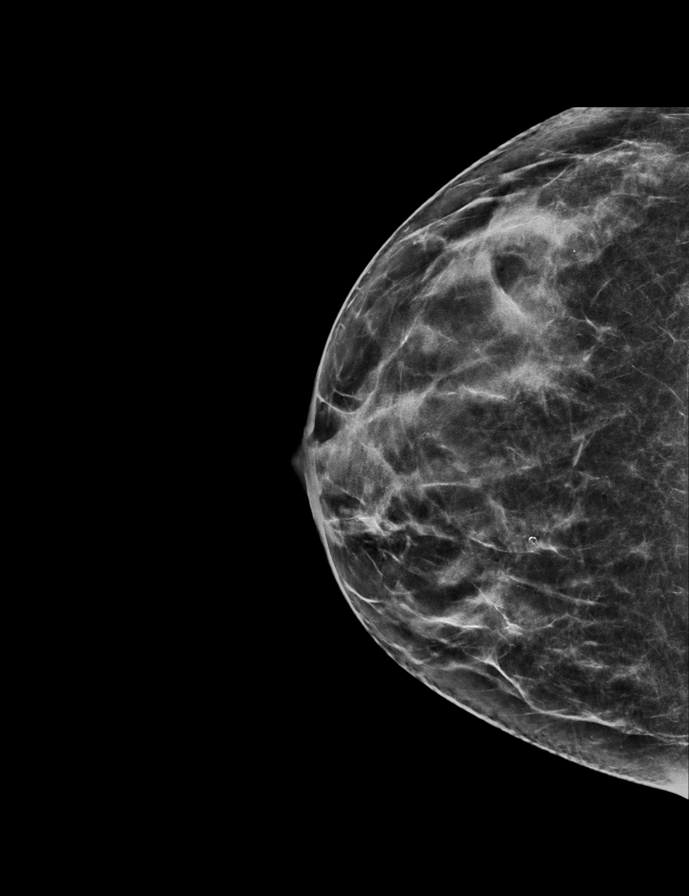

[L MLO synth-2D]
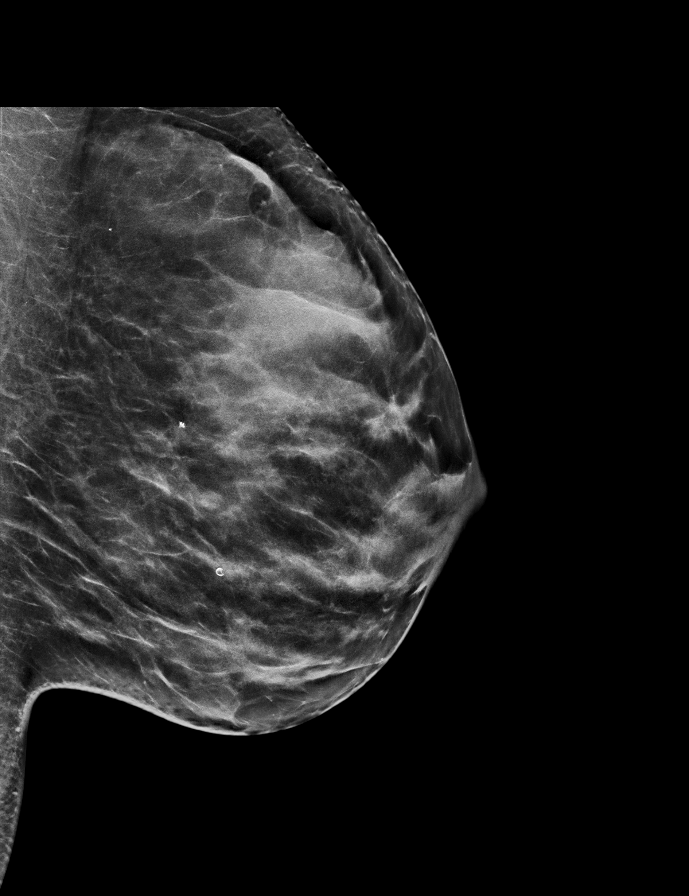

[L CC]
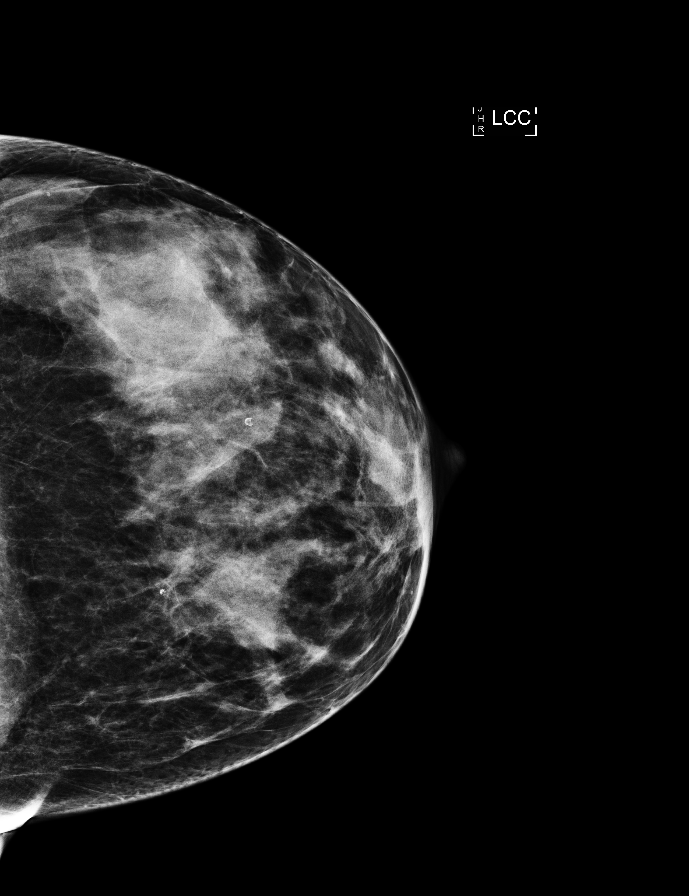

[L CC synth-2D]
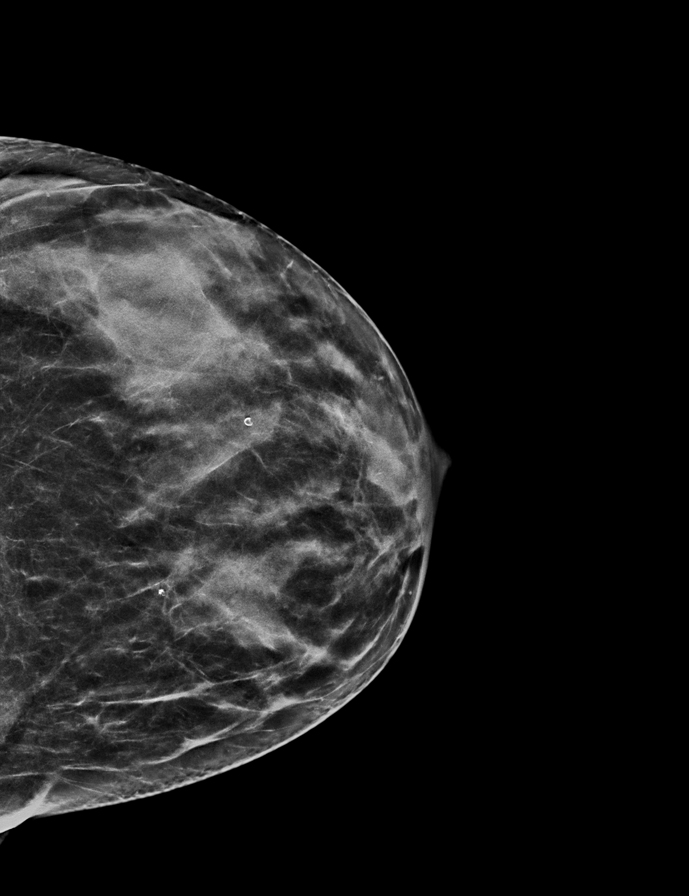

[L MLO]
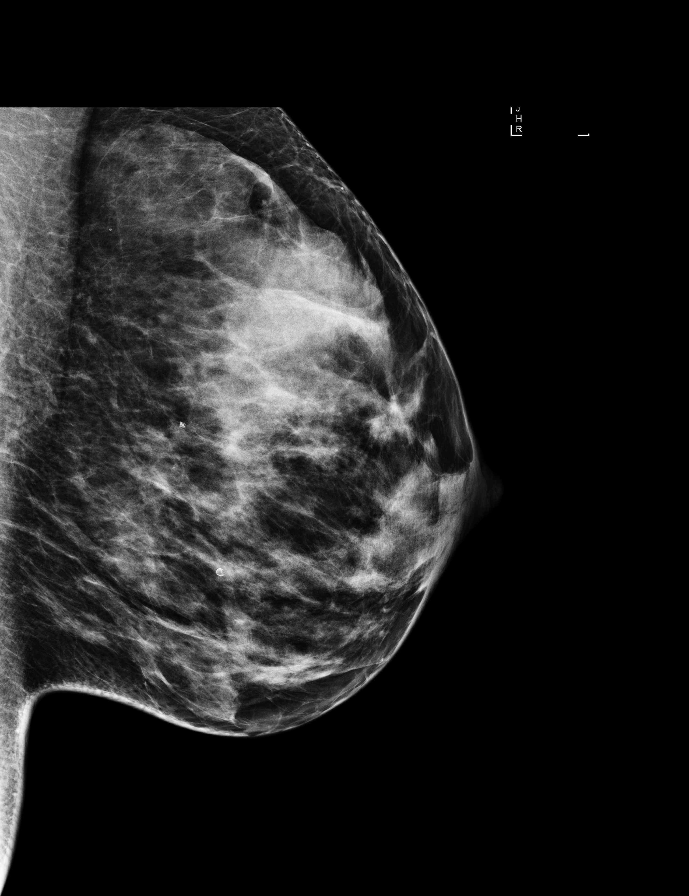

[R CC tomo · tomo slice 31/62.0]
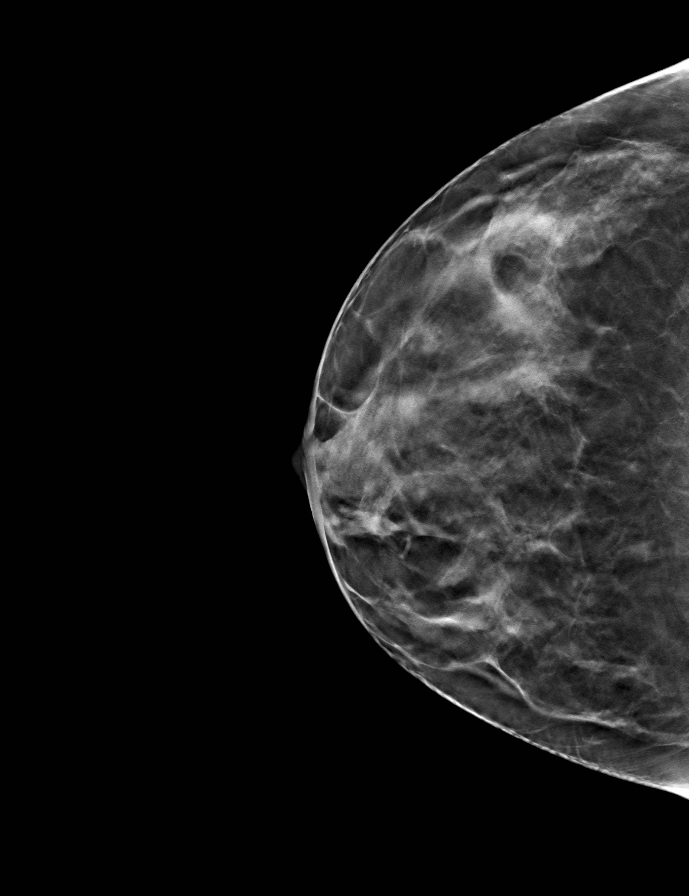

[9 of 28 positions shown; findings below may reference images not displayed]

ACR Breast Density Category d: The breast tissue is extremely dense,
which lowers the sensitivity of mammography.
FINDINGS: There are no findings suspicious for malignancy. Images were
processed with CAD.
IMPRESSION: No mammographic evidence of malignancy. A result letter of this
screening mammogram will be mailed directly to the patient.

RECOMMENDATION:
Screening mammogram in one year. (Code:[5L])

BI-RADS CATEGORY  1: Negative.

## 2017-10-05 ENCOUNTER — Other Ambulatory Visit: Payer: Self-pay | Admitting: Obstetrics and Gynecology

## 2017-10-05 DIAGNOSIS — Z1231 Encounter for screening mammogram for malignant neoplasm of breast: Secondary | ICD-10-CM

## 2017-10-23 ENCOUNTER — Ambulatory Visit: Payer: PRIVATE HEALTH INSURANCE

## 2017-10-26 ENCOUNTER — Ambulatory Visit: Payer: PRIVATE HEALTH INSURANCE

## 2017-12-25 ENCOUNTER — Encounter: Payer: Self-pay | Admitting: Radiology

## 2017-12-25 ENCOUNTER — Ambulatory Visit
Admission: RE | Admit: 2017-12-25 | Discharge: 2017-12-25 | Disposition: A | Discharge status: Home / Self Care | Payer: PRIVATE HEALTH INSURANCE | Source: Ambulatory Visit | Attending: Obstetrics and Gynecology | Admitting: Obstetrics and Gynecology

## 2017-12-25 DIAGNOSIS — Z1231 Encounter for screening mammogram for malignant neoplasm of breast: Secondary | ICD-10-CM

## 2017-12-25 IMAGING — MG 2D DIGITAL SCREENING BILATERAL MAMMOGRAM WITH 3D TOMO WITH CAD
9 of 12 series · 9 of 28 positions shown · non-contrast
Comparison: Previous exam(s).

CLINICAL DATA: Screening.

EXAM:
2D DIGITAL SCREENING BILATERAL MAMMOGRAM WITH 3D TOMO WITH CAD

[L MLO synth-2D]
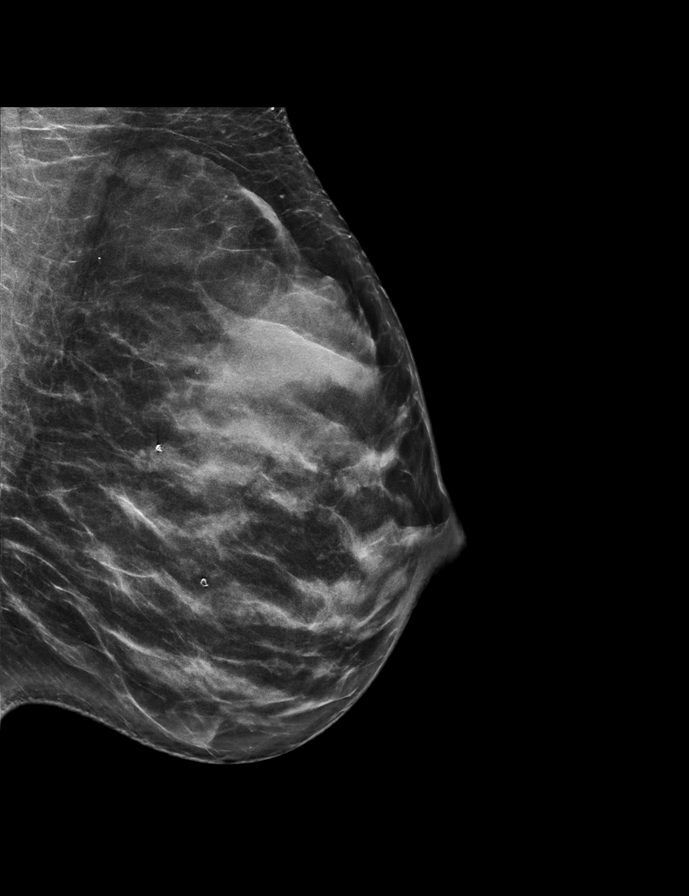

[L CC]
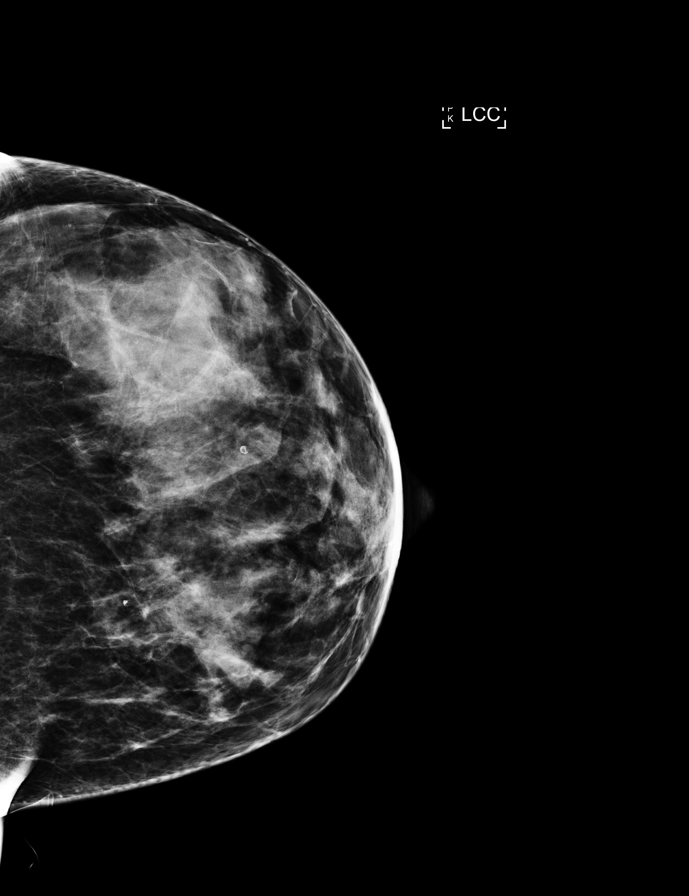

[R MLO synth-2D]
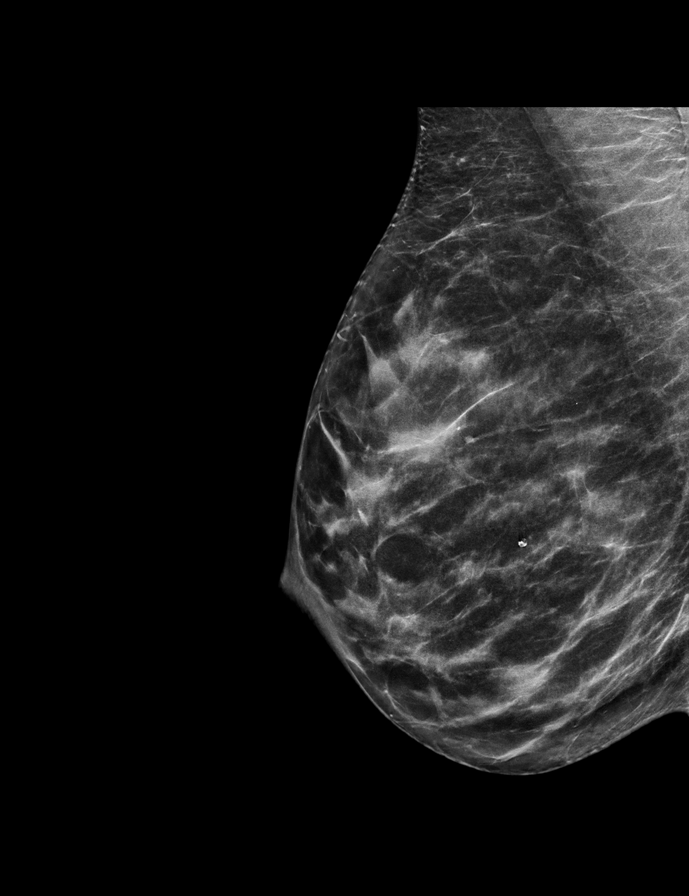

[R CC]
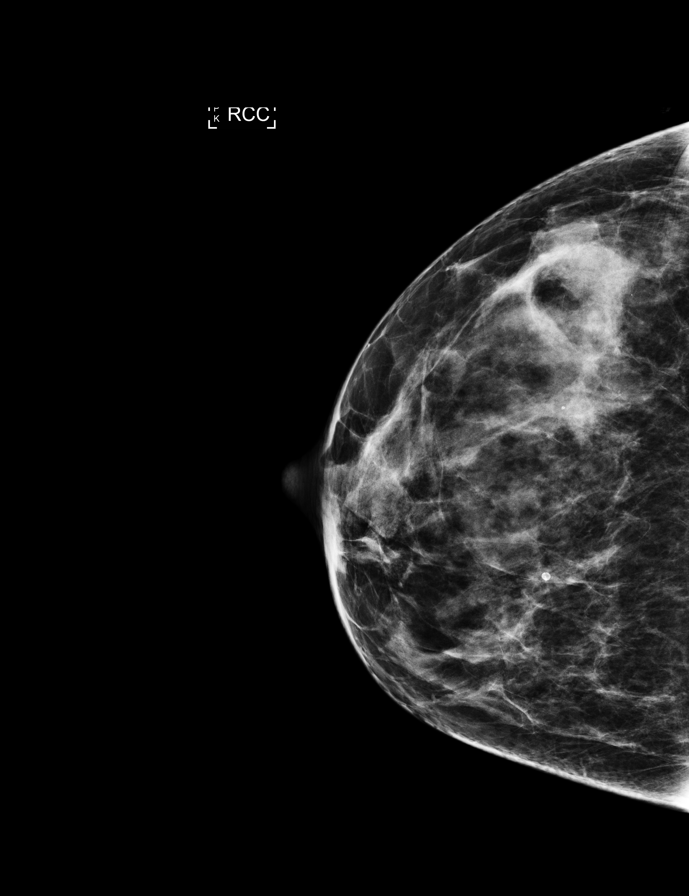

[L MLO]
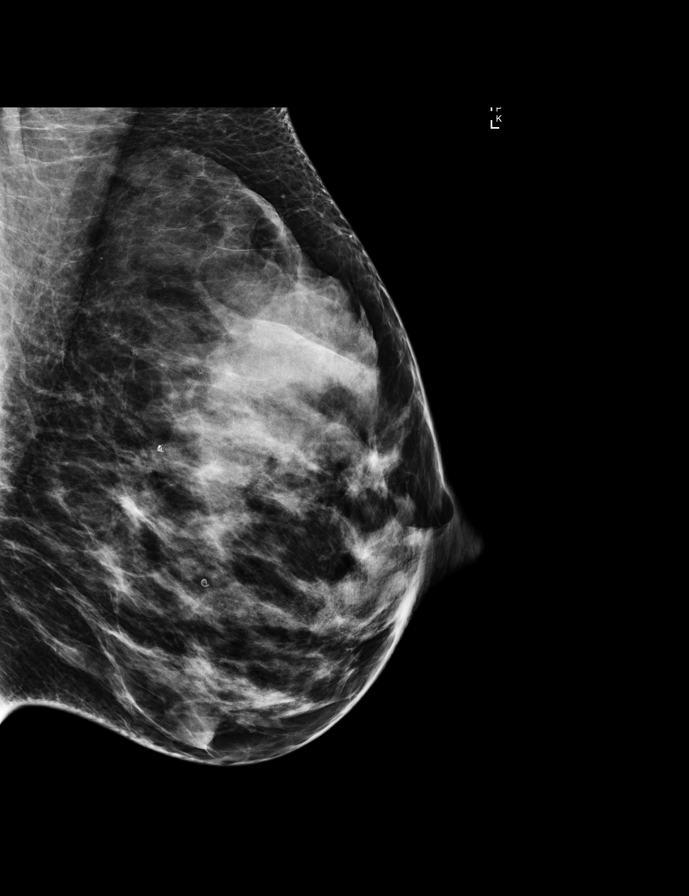

[L CC synth-2D]
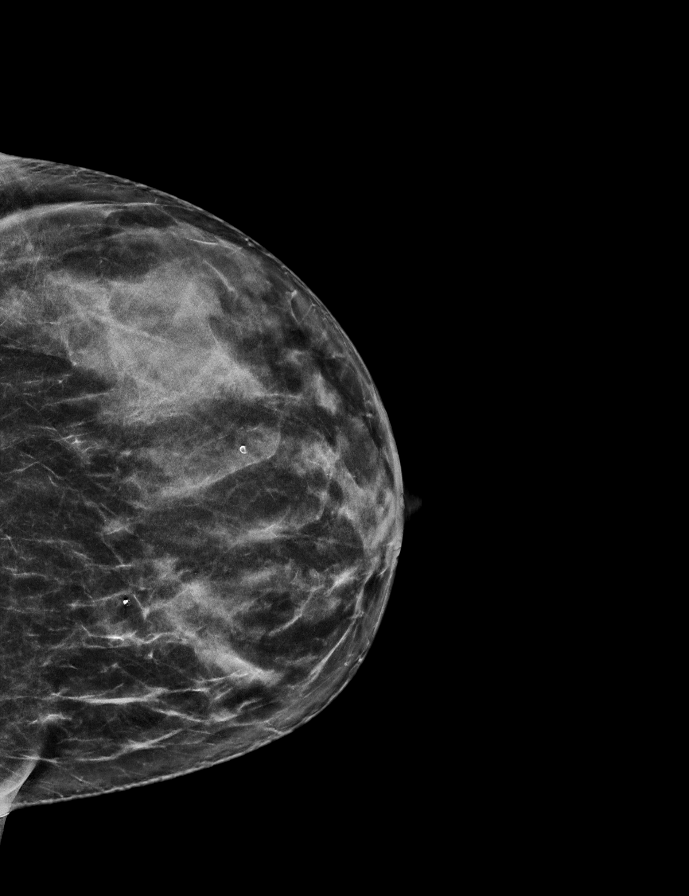

[R CC synth-2D]
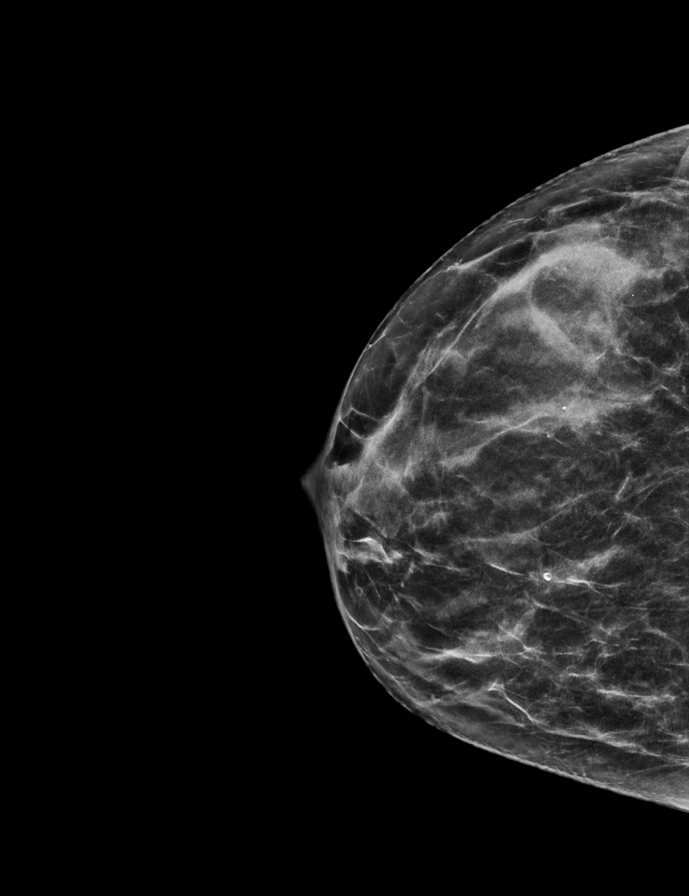

[R MLO]
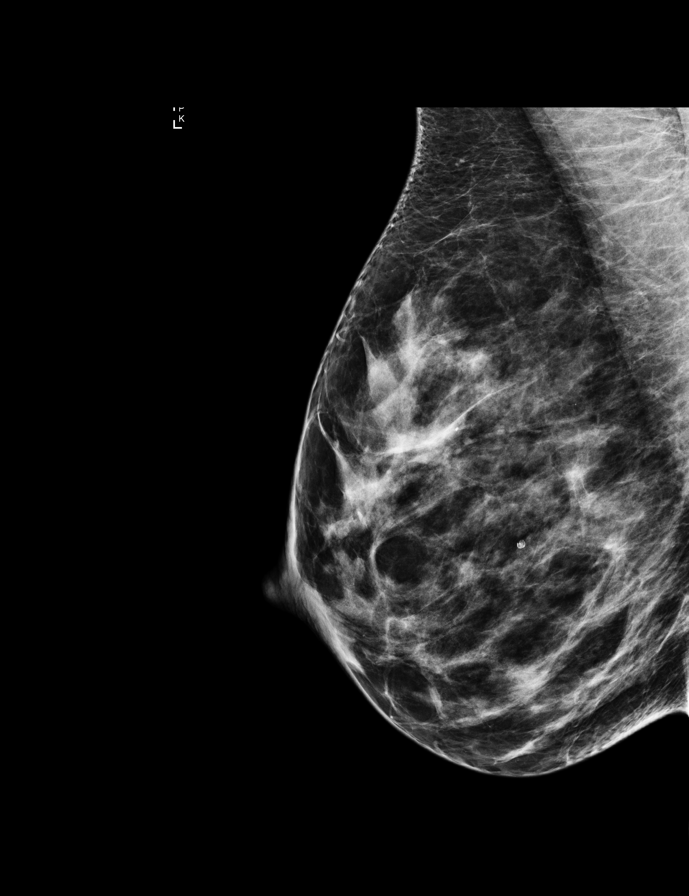

[R MLO tomo · tomo slice 31/60.0]
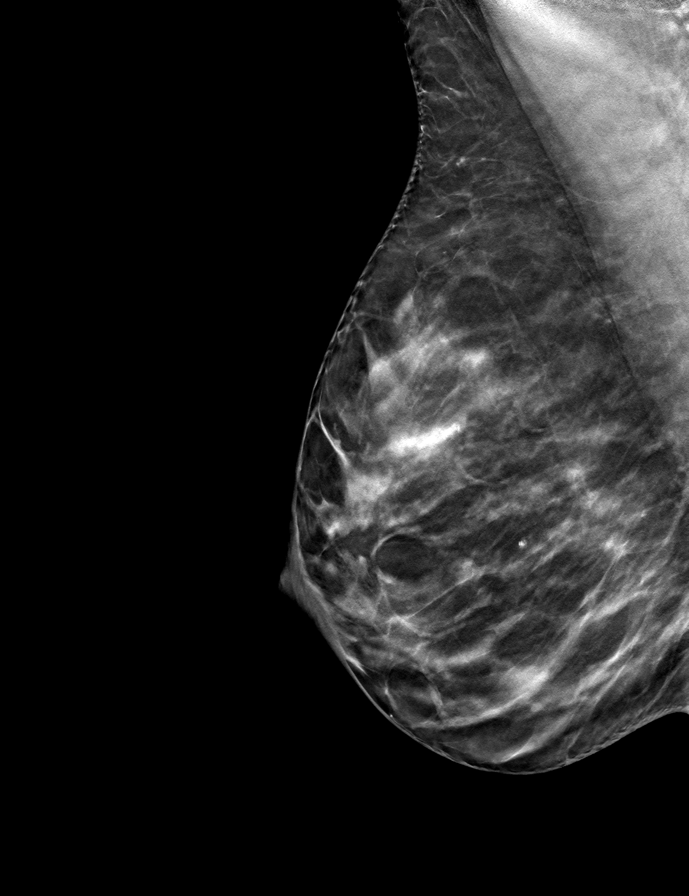

[9 of 28 positions shown; findings below may reference images not displayed]

ACR Breast Density Category c: The breast tissue is heterogeneously
dense, which may obscure small masses.
FINDINGS: There are no findings suspicious for malignancy. Images were
processed with CAD.
IMPRESSION: No mammographic evidence of malignancy. A result letter of this
screening mammogram will be mailed directly to the patient.

RECOMMENDATION:
Screening mammogram in one year. (Code:[4B])

BI-RADS CATEGORY  1: Negative.

## 2018-05-31 ENCOUNTER — Other Ambulatory Visit: Payer: Self-pay | Admitting: Obstetrics and Gynecology

## 2018-05-31 DIAGNOSIS — N6320 Unspecified lump in the left breast, unspecified quadrant: Secondary | ICD-10-CM

## 2018-06-05 ENCOUNTER — Ambulatory Visit
Admission: RE | Admit: 2018-06-05 | Discharge: 2018-06-05 | Disposition: A | Discharge status: Home / Self Care | Payer: PRIVATE HEALTH INSURANCE | Source: Ambulatory Visit | Attending: Obstetrics and Gynecology | Admitting: Obstetrics and Gynecology

## 2018-06-05 DIAGNOSIS — N6320 Unspecified lump in the left breast, unspecified quadrant: Secondary | ICD-10-CM

## 2018-06-05 IMAGING — US ULTRASOUND LEFT BREAST LIMITED
1 series · 10 of 10 positions shown · non-contrast
Comparison: Previous exam(s).

CLINICAL DATA: 42-year-old female with a palpable area of concern
in the upper-outer left breast that fluctuates with her menstrual
cycle. In addition, palpable area of concern in the lower
inner/periareolar left breast felt by the patient's physician.

EXAM:
ULTRASOUND OF THE LEFT BREAST

[Series 1: ultrasound left breast limited · 0.06mm/px · 10 of 10 slices shown]
[im 1/10]
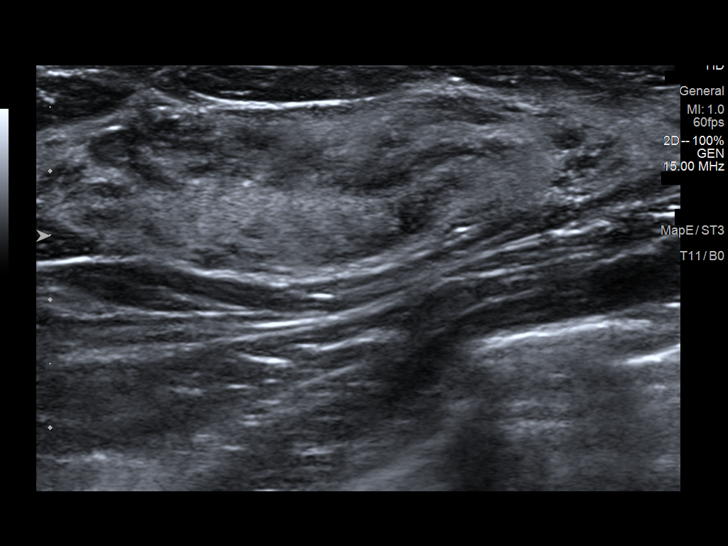
[im 2/10]
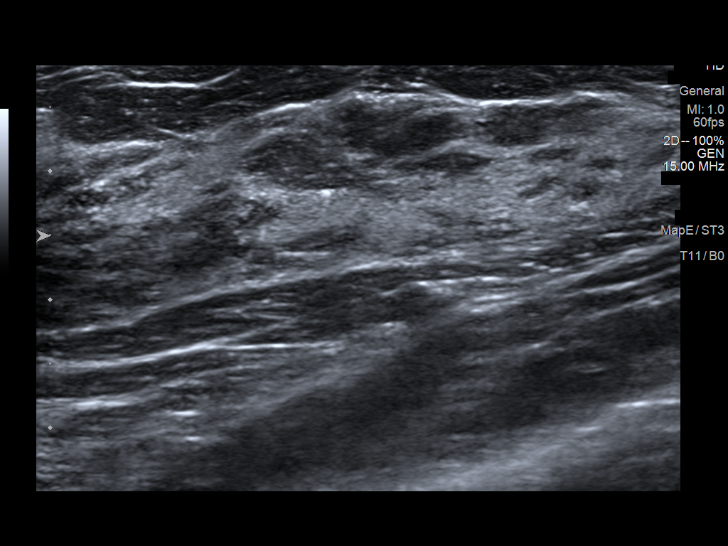
[im 3/10]
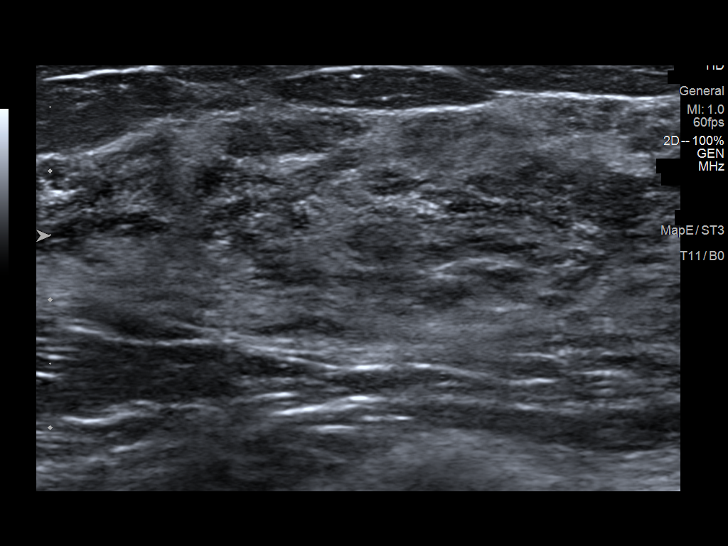
[im 4/10]
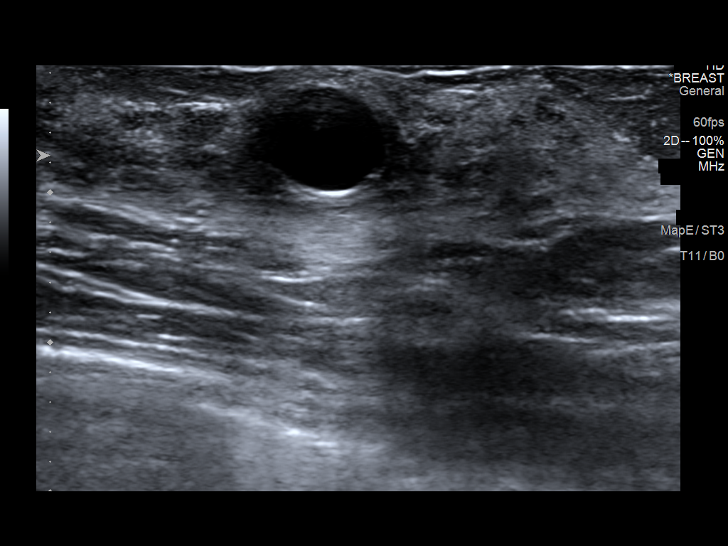
[im 5/10]
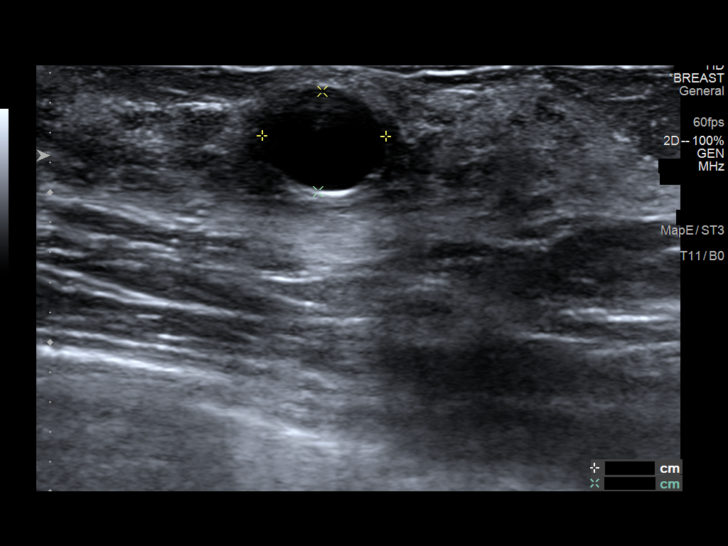
[im 6/10]
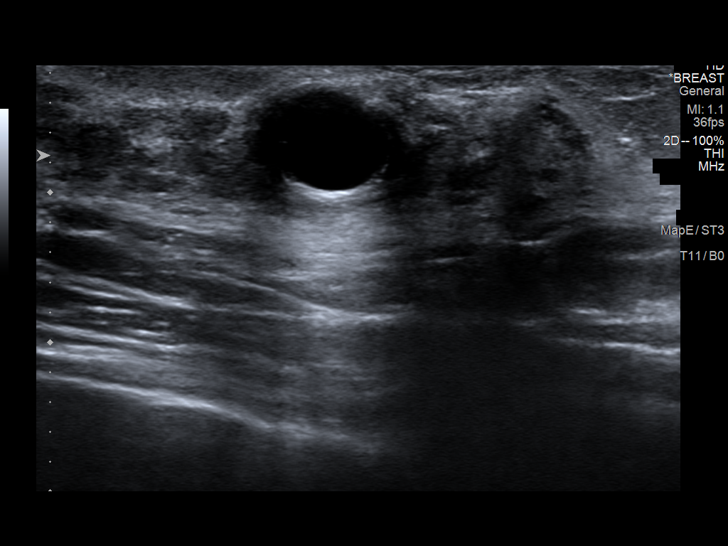
[im 7/10]
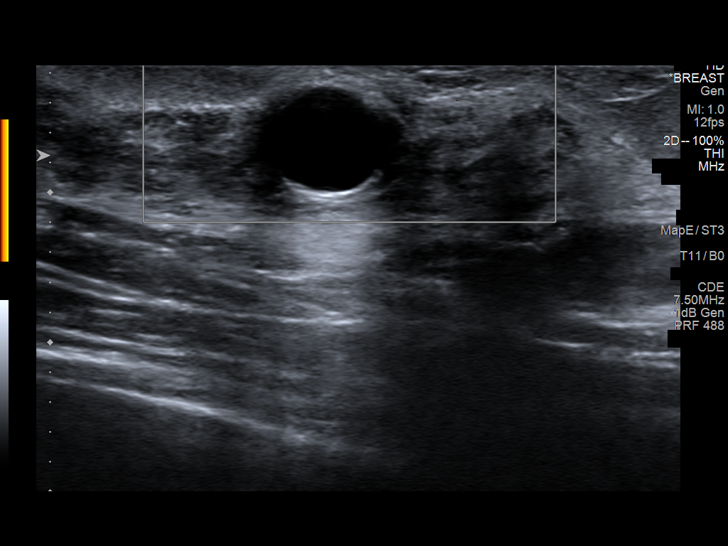
[im 8/10]
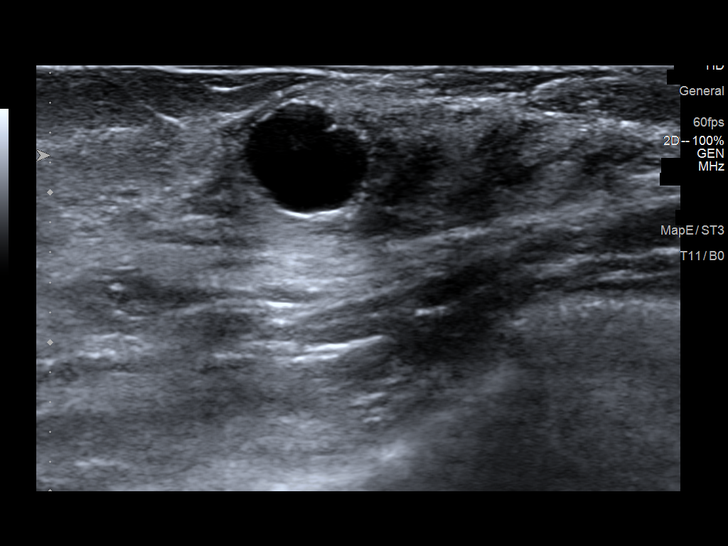
[im 9/10]
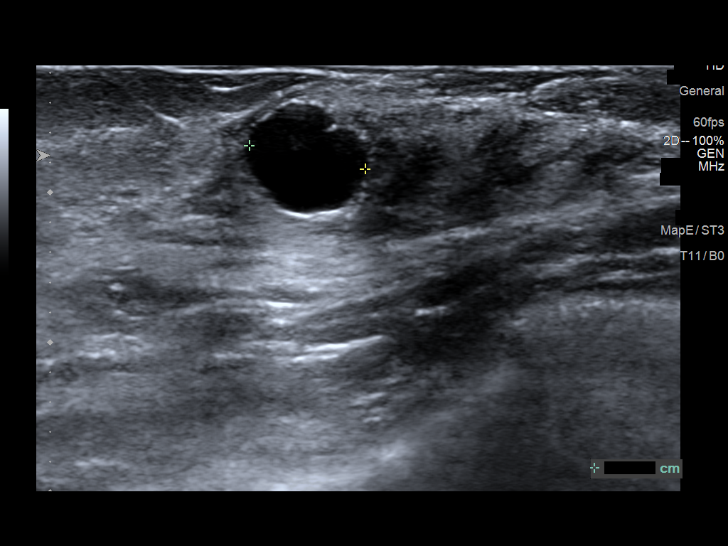
[im 10/10]
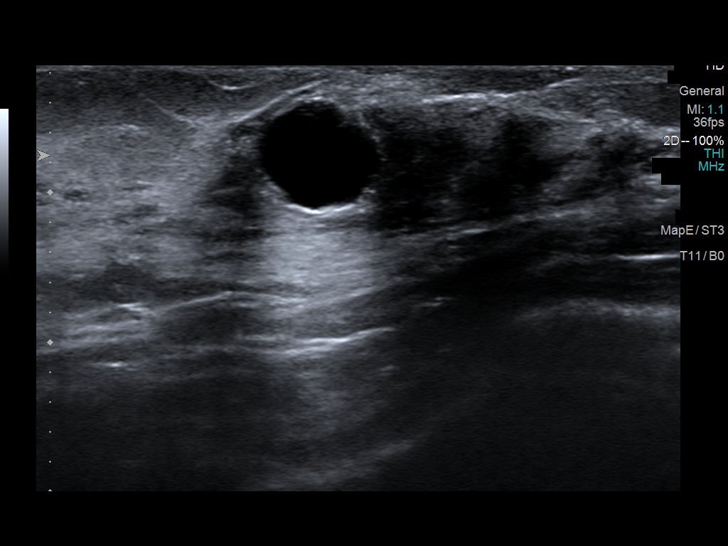

[10 of 10 positions shown; findings below may reference images not displayed]

FINDINGS: The patient's prior mammograms were reviewed demonstrating
heterogeneous fibroglandular tissue, overall stable in appearance
dating back to prior mammograms from [DT]. Most recent screening
mammogram was less than 6 months ago and therefore ultrasound only
was performed today.

Physical examination of the upper-outer left breast reveals
generalized thickening without a discrete underlying palpable mass.
Physical examination of the lower inner/periareolar left breast
reveals firmness at the approximate 7-8 o'clock position.

Targeted ultrasound of the upper-outer left breast was performed
demonstrating a band of extremely dense fibroglandular tissue. No
suspicious masses or abnormality seen in the outer left breast.

Targeted ultrasound of the lower inner left breast in region of
palpable concern was performed demonstrating a simple cyst at 7
o'clock 3 cm from nipple measuring 0.8 x 0.7 x 0.8 cm. No suspicious
masses or abnormality seen in the lower inner left breast.
IMPRESSION: No suspicious sonographic abnormalities identified at the sites of
palpable concern in the left breast; only dense fibroglandular
tissue is seen in the upper-outer quadrant of the left breast and a
simple cyst is present at site of palpable concern in the lower
inner left breast.

RECOMMENDATION:
Recommend annual routine screening mammography, due [DATE].

I have discussed the findings and recommendations with the patient.
Results were also provided in writing at the conclusion of the
visit. If applicable, a reminder letter will be sent to the patient
regarding the next appointment.

BI-RADS CATEGORY  2: Benign.

## 2018-09-07 ENCOUNTER — Encounter (HOSPITAL_COMMUNITY): Payer: Self-pay | Admitting: *Deleted

## 2018-09-07 ENCOUNTER — Inpatient Hospital Stay (HOSPITAL_COMMUNITY)
Admission: AD | Admit: 2018-09-07 | Discharge: 2018-09-07 | Disposition: A | Discharge status: Home / Self Care | Payer: PRIVATE HEALTH INSURANCE | Source: Ambulatory Visit | Attending: Obstetrics and Gynecology | Admitting: Obstetrics and Gynecology

## 2018-09-07 ENCOUNTER — Inpatient Hospital Stay (HOSPITAL_COMMUNITY): Payer: PRIVATE HEALTH INSURANCE

## 2018-09-07 DIAGNOSIS — Z3A01 Less than 8 weeks gestation of pregnancy: Secondary | ICD-10-CM | POA: Diagnosis not present

## 2018-09-07 DIAGNOSIS — O209 Hemorrhage in early pregnancy, unspecified: Secondary | ICD-10-CM | POA: Insufficient documentation

## 2018-09-07 DIAGNOSIS — O26891 Other specified pregnancy related conditions, first trimester: Secondary | ICD-10-CM

## 2018-09-07 DIAGNOSIS — O200 Threatened abortion: Secondary | ICD-10-CM

## 2018-09-07 DIAGNOSIS — R109 Unspecified abdominal pain: Secondary | ICD-10-CM

## 2018-09-07 LAB — CBC WITH DIFFERENTIAL/PLATELET
BASOS ABS: 0 10*3/uL (ref 0.0–0.1)
BASOS PCT: 1 %
EOS ABS: 0.1 10*3/uL (ref 0.0–0.7)
Eosinophils Relative: 1 %
HEMATOCRIT: 38.2 % (ref 36.0–46.0)
Hemoglobin: 12.7 g/dL (ref 12.0–15.0)
Lymphocytes Relative: 37 %
Lymphs Abs: 2.4 10*3/uL (ref 0.7–4.0)
MCH: 32.2 pg (ref 26.0–34.0)
MCHC: 33.2 g/dL (ref 30.0–36.0)
MCV: 97 fL (ref 78.0–100.0)
Monocytes Absolute: 0.3 10*3/uL (ref 0.1–1.0)
Monocytes Relative: 5 %
Neutro Abs: 3.6 10*3/uL (ref 1.7–7.7)
Neutrophils Relative %: 56 %
Platelets: 245 10*3/uL (ref 150–400)
RBC: 3.94 MIL/uL (ref 3.87–5.11)
RDW: 13.5 % (ref 11.5–15.5)
WBC: 6.4 10*3/uL (ref 4.0–10.5)

## 2018-09-07 LAB — URINALYSIS, ROUTINE W REFLEX MICROSCOPIC
BILIRUBIN URINE: NEGATIVE
Glucose, UA: NEGATIVE mg/dL
KETONES UR: NEGATIVE mg/dL
LEUKOCYTES UA: NEGATIVE
NITRITE: NEGATIVE
PROTEIN: NEGATIVE mg/dL
Specific Gravity, Urine: 1.005 (ref 1.005–1.030)
pH: 7 (ref 5.0–8.0)

## 2018-09-07 LAB — POCT PREGNANCY, URINE: PREG TEST UR: POSITIVE — AB

## 2018-09-07 LAB — HCG, QUANTITATIVE, PREGNANCY: HCG, BETA CHAIN, QUANT, S: 642 m[IU]/mL — AB (ref <5)

## 2018-09-07 IMAGING — US US OB < 14 WEEKS - US OB TV
1 series · 15 of 19 positions shown · non-contrast
Comparison: None.

CLINICAL DATA: Pain, bleeding

EXAM:
OBSTETRIC <14 WK US AND TRANSVAGINAL OB US
TECHNIQUE: Both transabdominal and transvaginal ultrasound examinations were
performed for complete evaluation of the gestation as well as the
maternal uterus, adnexal regions, and pelvic cul-de-sac.
Transvaginal technique was performed to assess early pregnancy.

[Series 1: us ob < 14 weeks - us ob tv · 15 of 19 slices shown]
[im 1/19]
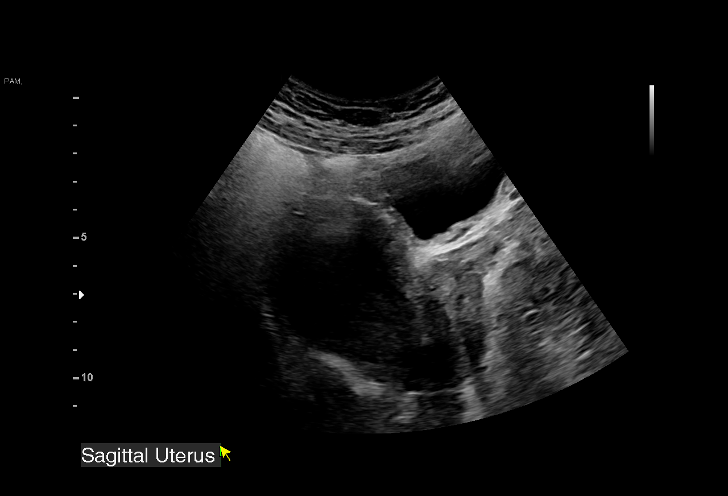
[im 2/19]
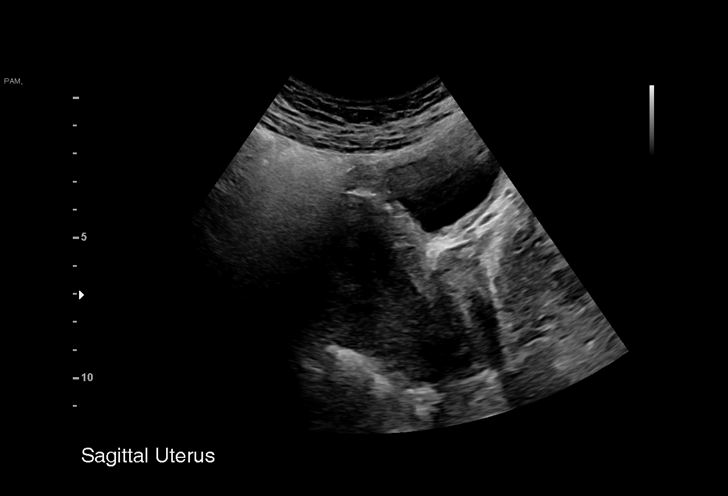
[im 4/19]
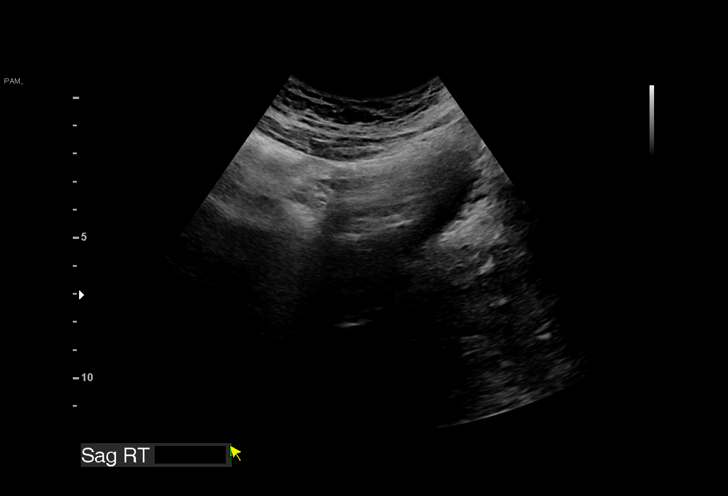
[im 5/19]
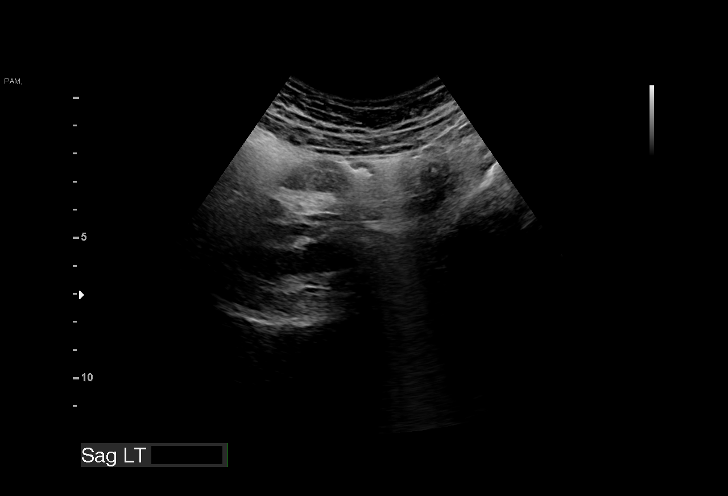
[im 6/19]
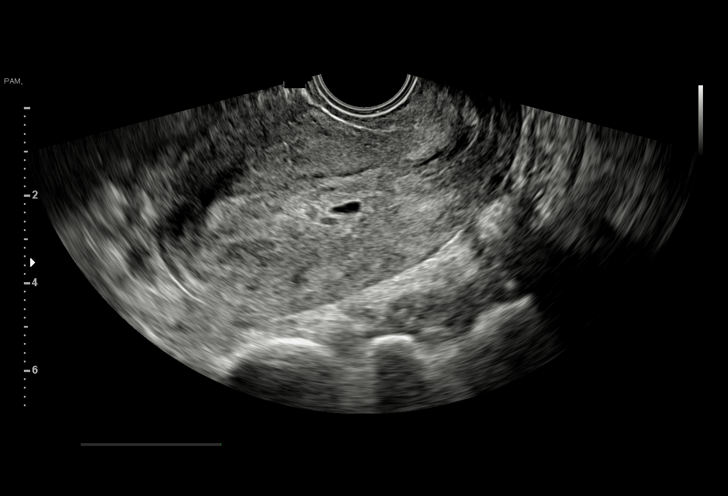
[im 7/19]
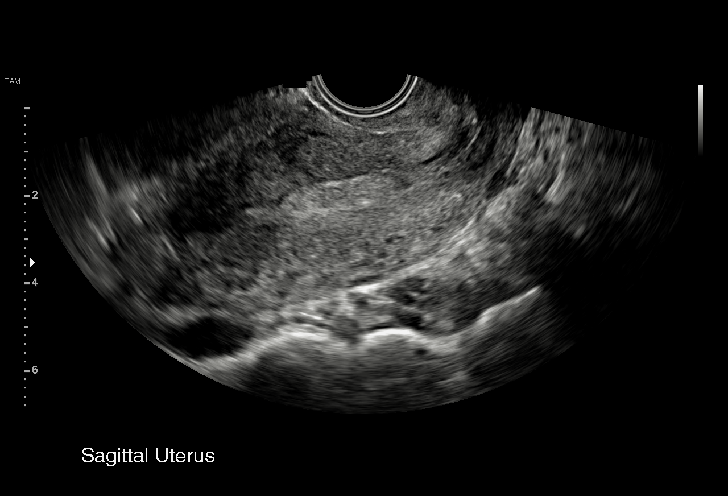
[im 9/19]
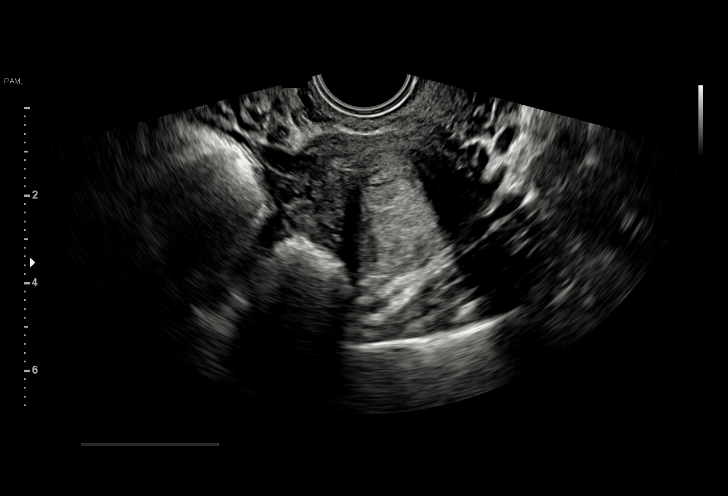
[im 10/19]
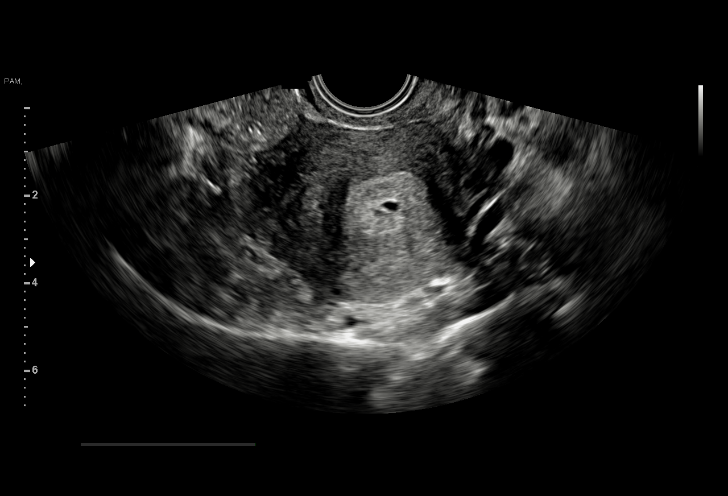
[im 11/19]
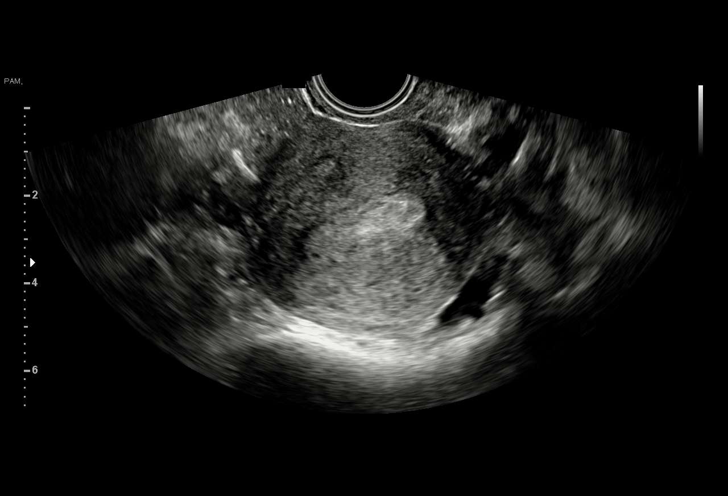
[im 13/19]
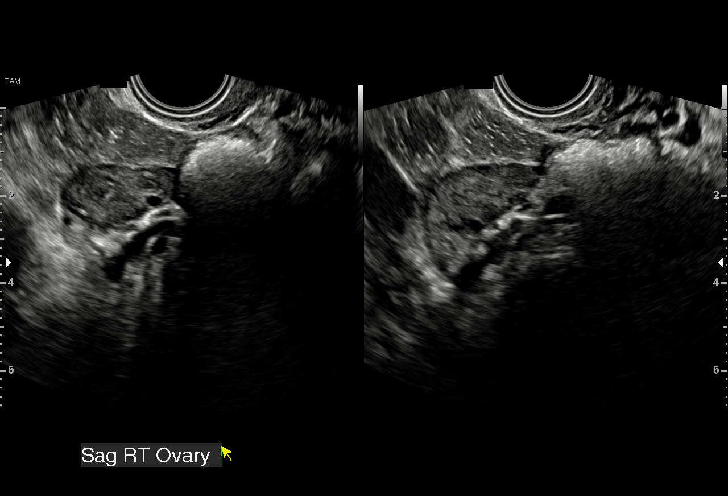
[im 14/19]
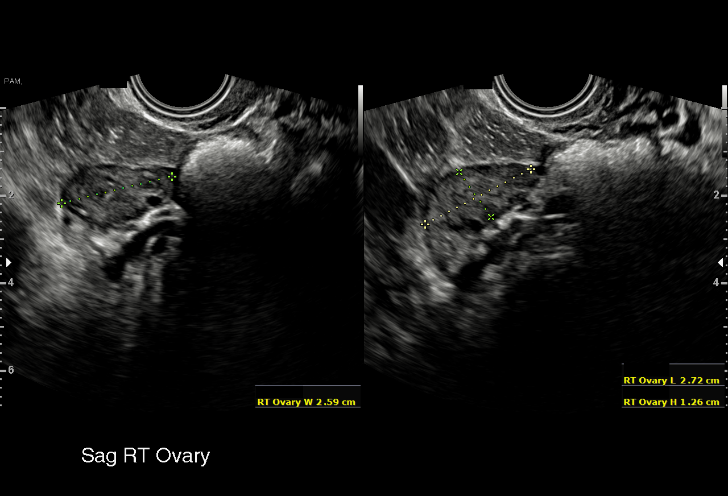
[im 15/19]
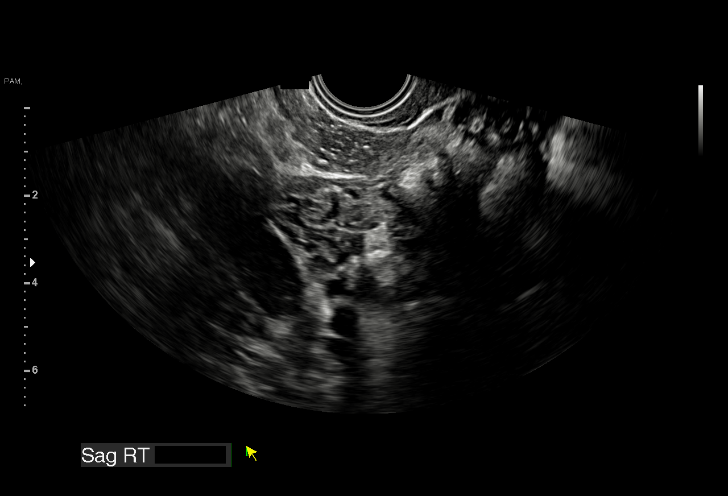
[im 16/19]
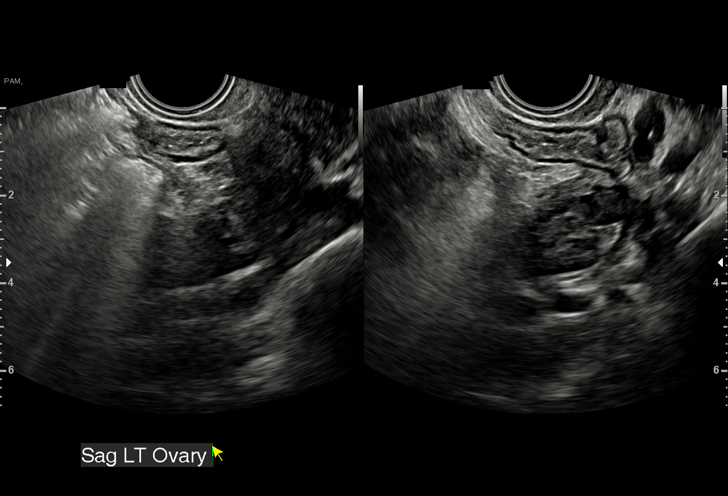
[im 18/19]
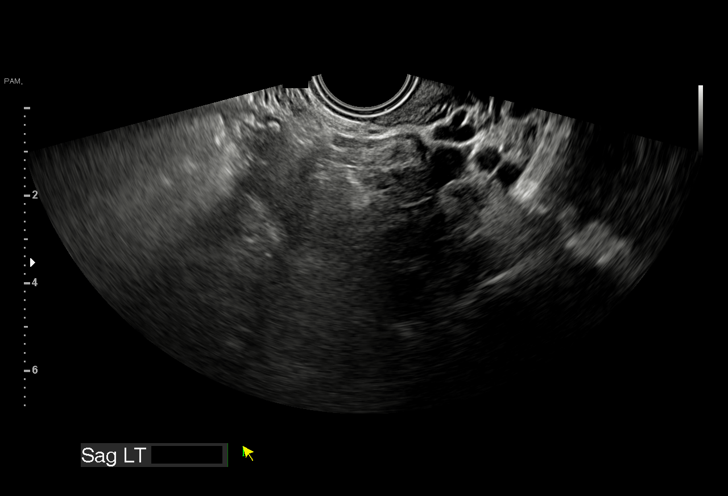
[im 19/19]
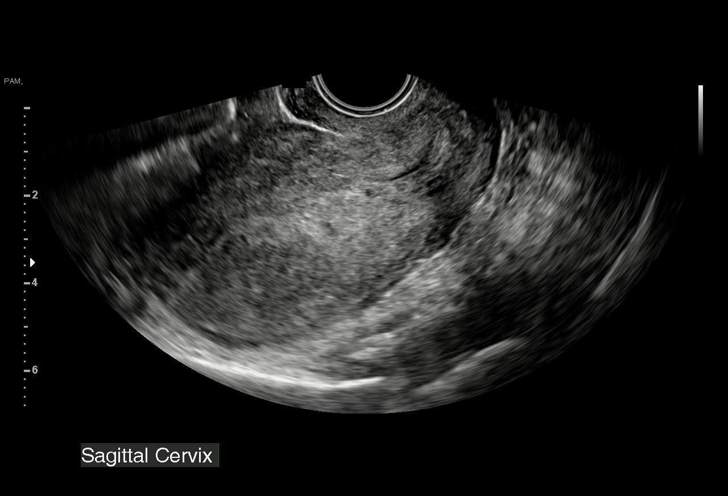

[15 of 19 positions shown; findings below may reference images not displayed]

FINDINGS: Intrauterine gestational sac: Single

Yolk sac:  Not visualized

Embryo:  Not visualized

Cardiac Activity:

Heart Rate:   bpm

MSD: 4.4 mm   5 w   1 d

CRL:    mm    w    d                  US EDC:

Subchorionic hemorrhage:  None visualized.

Maternal uterus/adnexae: No adnexal mass or free fluid.
IMPRESSION: Five week 1 day intrauterine gestational sac. No yolk sac or fetal
pole currently. No acute maternal findings.

## 2018-09-07 NOTE — MAU Note (Signed)
Started bleeding yesterday, cramping, thinks maybe miscarriage. Pain on RLQ, left side. Also here to r/o ectopic pregnancy

## 2018-09-07 NOTE — MAU Provider Note (Signed)
History   Son is a 42 year old female today G2 P1-0-0-1 at 6 weeks and 1 day in with abdominal cramping and bleeding.  She states that she has been spotting off and on all week and has had Qu[place] in the office the first was 1097 the second 1 was 1341.  She has had dark spotting off and on for 3 to 4 days but today the bleeding has increased to bright red and the flow of normal menses.   CSN: [id number]  Arrival date & time 09/07/18  1616   None     Chief Complaint  Patient presents with  . Abdominal Pain    HPI  Past Medical History:  Diagnosis Date  . Hypothyroidism     Past Surgical History:  Procedure Laterality Date  . NASAL SEPTUM SURGERY    . vaginal polyp removed      No family history on file.  Social History   Tobacco Use  . Smoking status: Not on file  Substance Use Topics  . Alcohol use: No  . Drug use: No    OB History    Gravida  2   Para  1   Term  1   Preterm      AB      Living  1     SAB      TAB      Ectopic      Multiple      Live Births  1           Review of Systems  Constitutional: Negative.   HENT: Negative.   Eyes: Negative.   Respiratory: Negative.   Cardiovascular: Negative.   Gastrointestinal: Positive for abdominal pain.  Endocrine: Negative.   Genitourinary: Positive for vaginal bleeding.  Musculoskeletal: Negative.   Skin: Negative.   Allergic/Immunologic: Negative.   Neurological: Negative.   Hematological: Negative.   Psychiatric/Behavioral: Negative.     Allergies  Avelox [moxifloxacin hcl in nacl]  Home Medications    LMP 07/26/2018   Physical Exam  Constitutional: She is oriented to person, place, and time. She appears well-developed and well-nourished.  HENT:  Head: Normocephalic.  Cardiovascular: Normal rate, regular rhythm and normal heart sounds.  Pulmonary/Chest: Effort normal and breath sounds normal.  Abdominal: Soft. Normal appearance and bowel sounds are normal. There is  generalized tenderness.  Genitourinary: Vagina normal and uterus normal.  Neurological: She is alert and oriented to person, place, and time.  Skin: Skin is warm and dry.  Psychiatric: She has a normal mood and affect. Her behavior is normal.    MAU Course  Procedures (including critical care time)  Labs Reviewed  URINALYSIS, ROUTINE W REFLEX MICROSCOPIC - Abnormal; Notable for the following components:      Result Value   Color, Urine STRAW (*)    Hgb urine dipstick LARGE (*)    RBC / HPF >50 (*)    Bacteria, UA RARE (*)    All other components within normal limits  POCT PREGNANCY, URINE - Abnormal; Notable for the following components:   Preg Test, Ur POSITIVE (*)    All other components within normal limits  CBC WITH DIFFERENTIAL/PLATELET  HCG, QUANTITATIVE, PREGNANCY   No results found. Results for orders placed or performed during the hospital encounter of 09/07/18 (from the past 24 hour(s))  Urinalysis, Routine w reflex microscopic     Status: Abnormal   Collection Time: 09/07/18  4:46 PM  Result Value Ref Range   Color, Urine  STRAW (A) YELLOW   APPearance CLEAR CLEAR   Specific Gravity, Urine 1.005 1.005 - 1.030   pH 7.0 5.0 - 8.0   Glucose, UA NEGATIVE NEGATIVE mg/dL   Hgb urine dipstick LARGE (A) NEGATIVE   Bilirubin Urine NEGATIVE NEGATIVE   Ketones, ur NEGATIVE NEGATIVE mg/dL   Protein, ur NEGATIVE NEGATIVE mg/dL   Nitrite NEGATIVE NEGATIVE   Leukocytes, UA NEGATIVE NEGATIVE   RBC / HPF >50 (H) 0 - 5 RBC/hpf   WBC, UA 21-50 0 - 5 WBC/hpf   Bacteria, UA RARE (A) NONE SEEN   Squamous Epithelial / LPF 0-5 0 - 5  Pregnancy, urine POC     Status: Abnormal   Collection Time: 09/07/18  4:50 PM  Result Value Ref Range   Preg Test, Ur POSITIVE (A) NEGATIVE  CBC with Differential/Platelet     Status: None   Collection Time: 09/07/18  5:57 PM  Result Value Ref Range   WBC 6.4 4.0 - 10.5 K/uL   RBC 3.94 3.87 - 5.11 MIL/uL   Hemoglobin 12.7 12.0 - 15.0 g/dL   HCT  38.2 36.0 - 46.0 %   MCV 97.0 78.0 - 100.0 fL   MCH 32.2 26.0 - 34.0 pg   MCHC 33.2 30.0 - 36.0 g/dL   RDW 13.5 11.5 - 15.5 %   Platelets 245 150 - 400 K/uL   Neutrophils Relative % 56 %   Neutro Abs 3.6 1.7 - 7.7 K/uL   Lymphocytes Relative 37 %   Lymphs Abs 2.4 0.7 - 4.0 K/uL   Monocytes Relative 5 %   Monocytes Absolute 0.3 0.1 - 1.0 K/uL   Eosinophils Relative 1 %   Eosinophils Absolute 0.1 0.0 - 0.7 K/uL   Basophils Relative 1 %   Basophils Absolute 0.0 0.0 - 0.1 K/uL  hCG, quantitative, pregnancy     Status: Abnormal   Collection Time: 09/07/18  5:57 PM  Result Value Ref Range   hCG, Beta Chain, Quant, S 642 (H) <5 mIU/mL   No diagnosis found.    MDM  Vital signs are stable.  Abdomen soft there is slight tenderness noted that is generalized over the lower abdomen.  She is having a small to moderate amount of bright vaginal bleeding.  Quant today is down to 642 from 1341 in the office the other day.  Ultrasound showed IUP with gestational sac no yolk sac no heartbeat at this time.  Plan of care discussed with Dr. Matthew Saras patient is to follow-up next week in the office Monday or Tuesday for evaluation.  Discharge home in stable condition.

## 2018-09-07 NOTE — Discharge Instructions (Signed)
Vaginal Bleeding During Pregnancy, First Trimester °A small amount of bleeding (spotting) from the vagina is common in early pregnancy. Sometimes the bleeding is normal and is not a problem, and sometimes it is a sign of something serious. Be sure to tell your doctor about any bleeding from your vagina right away. °Follow these instructions at home: °· Watch your condition for any changes. °· Follow your doctor's instructions about how active you can be. °· If you are on bed rest: °? You may need to stay in bed and only get up to use the bathroom. °? You may be allowed to do some activities. °? If you need help, make plans for someone to help you. °· Write down: °? The number of pads you use each day. °? How often you change pads. °? How soaked (saturated) your pads are. °· Do not use tampons. °· Do not douche. °· Do not have sex or orgasms until your doctor says it is okay. °· If you pass any tissue from your vagina, save the tissue so you can show it to your doctor. °· Only take medicines as told by your doctor. °· Do not take aspirin because it can make you bleed. °· Keep all follow-up visits as told by your doctor. °Contact a doctor if: °· You bleed from your vagina. °· You have cramps. °· You have labor pains. °· You have a fever that does not go away after you take medicine. °Get help right away if: °· You have very bad cramps in your back or belly (abdomen). °· You pass large clots or tissue from your vagina. °· You bleed more. °· You feel light-headed or weak. °· You pass out (faint). °· You have chills. °· You are leaking fluid or have a gush of fluid from your vagina. °· You pass out while pooping (having a bowel movement). °This information is not intended to replace advice given to you by your health care provider. Make sure you discuss any questions you have with your health care provider. °Document Released: 04/20/2014 Document Revised: 05/11/2016 Document Reviewed: 08/11/2013 °Elsevier Interactive  Patient Education © 2018 [employer] ° °

## 2018-11-29 ENCOUNTER — Other Ambulatory Visit: Payer: Self-pay | Admitting: Obstetrics and Gynecology

## 2018-11-29 DIAGNOSIS — Z1231 Encounter for screening mammogram for malignant neoplasm of breast: Secondary | ICD-10-CM

## 2019-01-10 ENCOUNTER — Ambulatory Visit: Payer: PRIVATE HEALTH INSURANCE

## 2019-02-07 ENCOUNTER — Ambulatory Visit: Payer: PRIVATE HEALTH INSURANCE

## 2019-02-11 ENCOUNTER — Ambulatory Visit
Admission: RE | Admit: 2019-02-11 | Discharge: 2019-02-11 | Disposition: A | Discharge status: Home / Self Care | Payer: PRIVATE HEALTH INSURANCE | Source: Ambulatory Visit | Attending: Obstetrics and Gynecology | Admitting: Obstetrics and Gynecology

## 2019-02-11 DIAGNOSIS — Z1231 Encounter for screening mammogram for malignant neoplasm of breast: Secondary | ICD-10-CM

## 2019-02-11 IMAGING — MG DIGITAL SCREENING BILATERAL MAMMOGRAM WITH TOMO AND CAD
8 series · 9 of 24 positions shown · non-contrast
Comparison: Previous exam(s).

CLINICAL DATA: Screening.

EXAM:
DIGITAL SCREENING BILATERAL MAMMOGRAM WITH TOMO AND CAD

[R CC synth-2D]
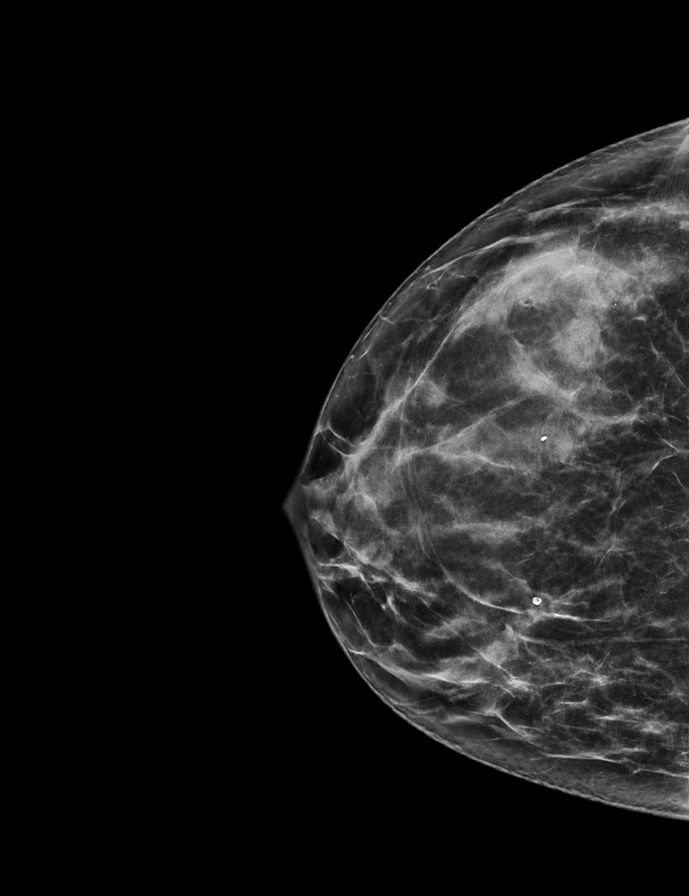

[L MLO synth-2D]
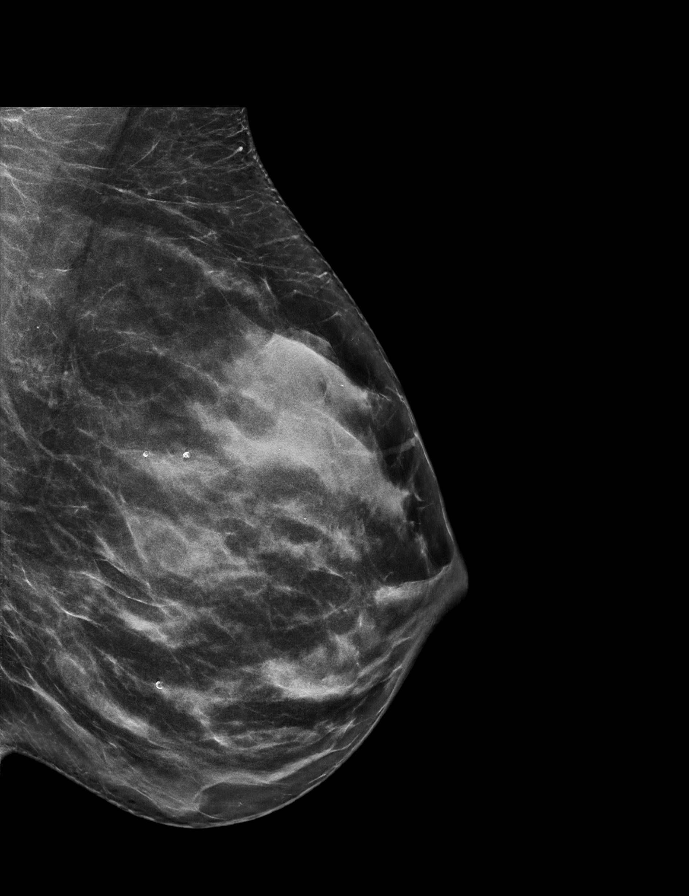

[R MLO synth-2D]
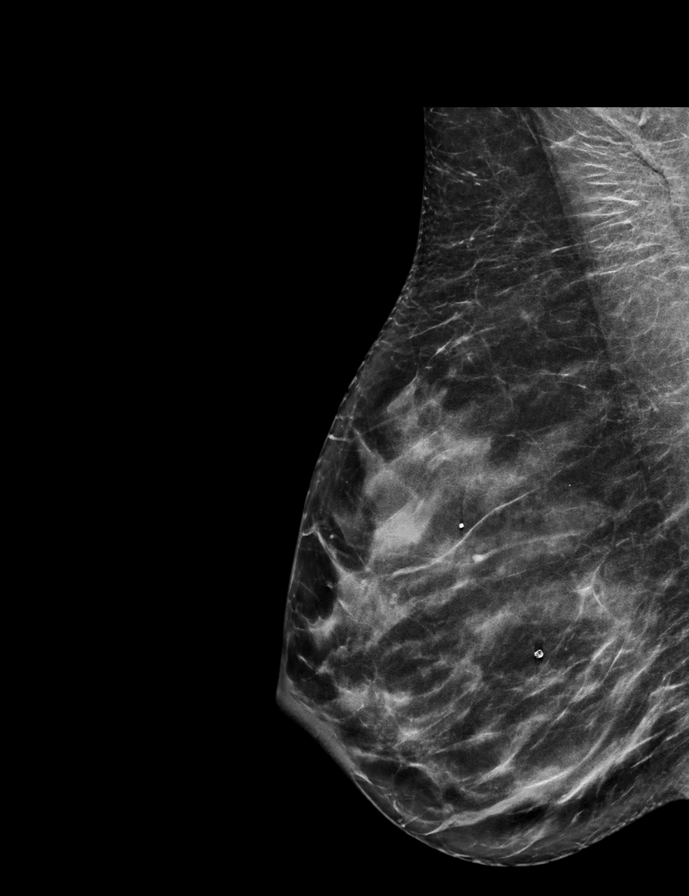

[L CC synth-2D]
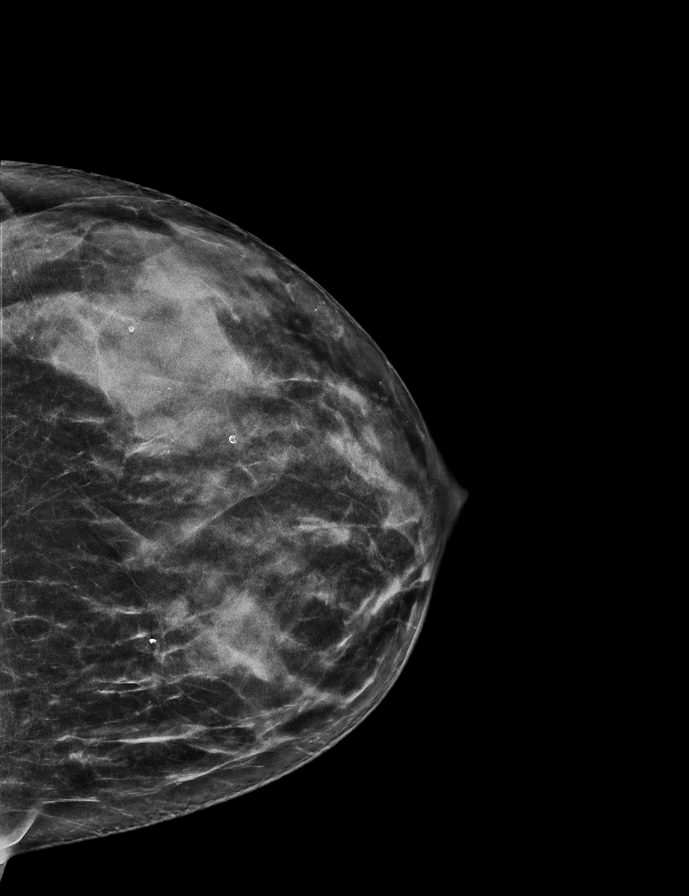

[L CC tomo · 2 of 62 frames shown]
[frame 21/62]
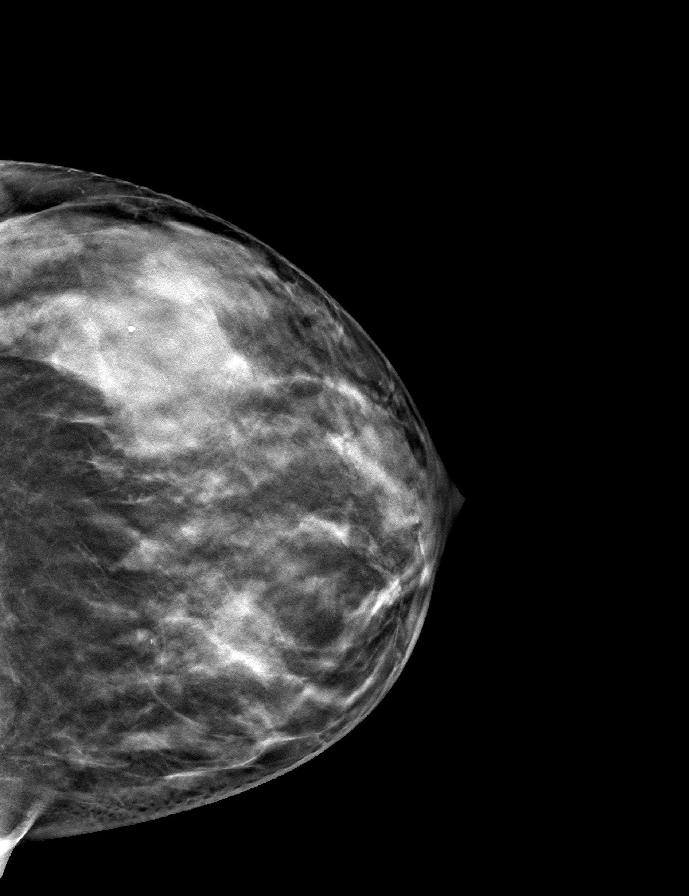
[frame 31/62]
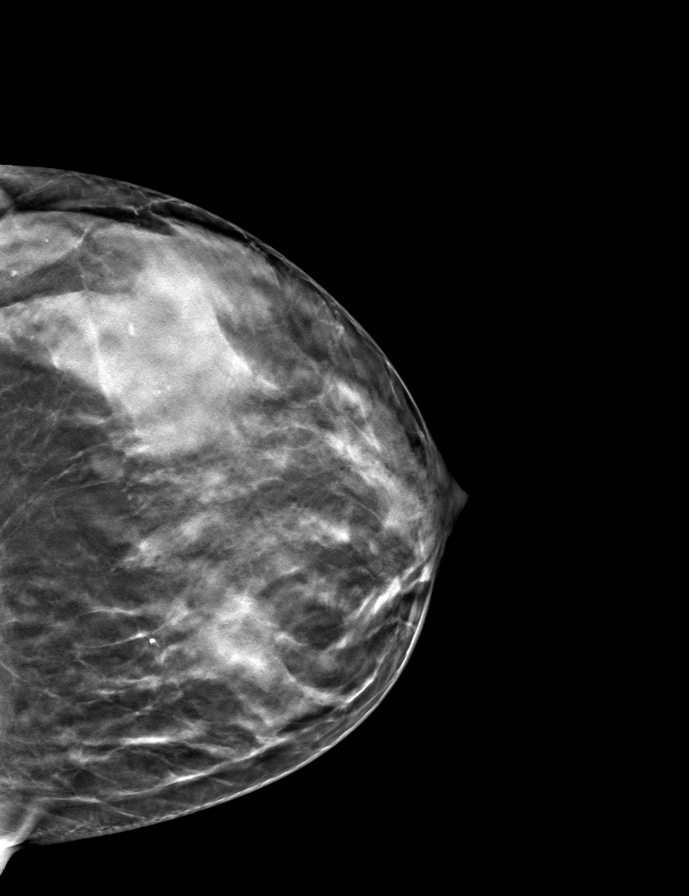

[R MLO tomo · tomo slice 33/66.0]
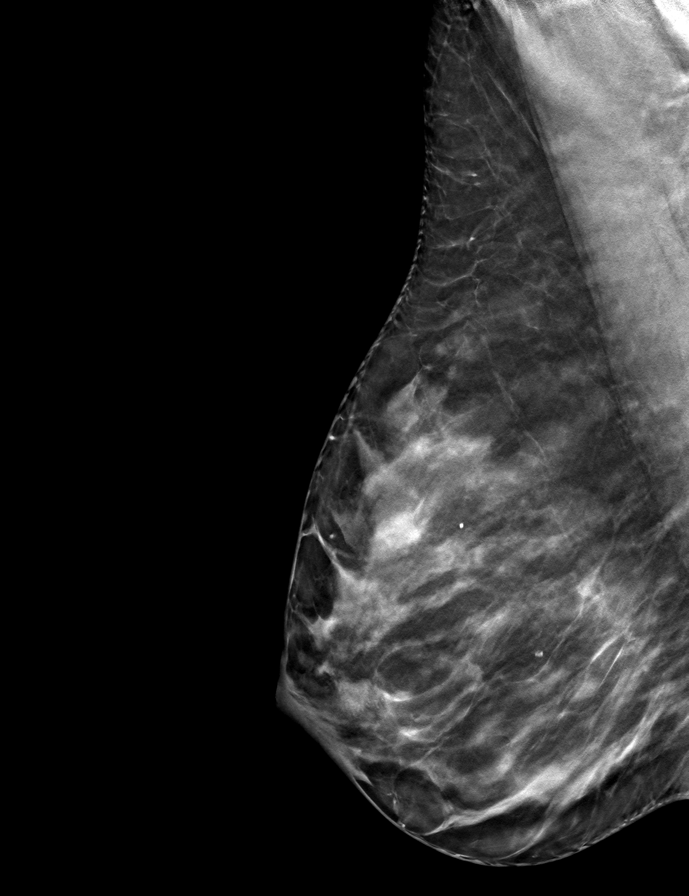

[L MLO tomo · tomo slice 33/64.0]
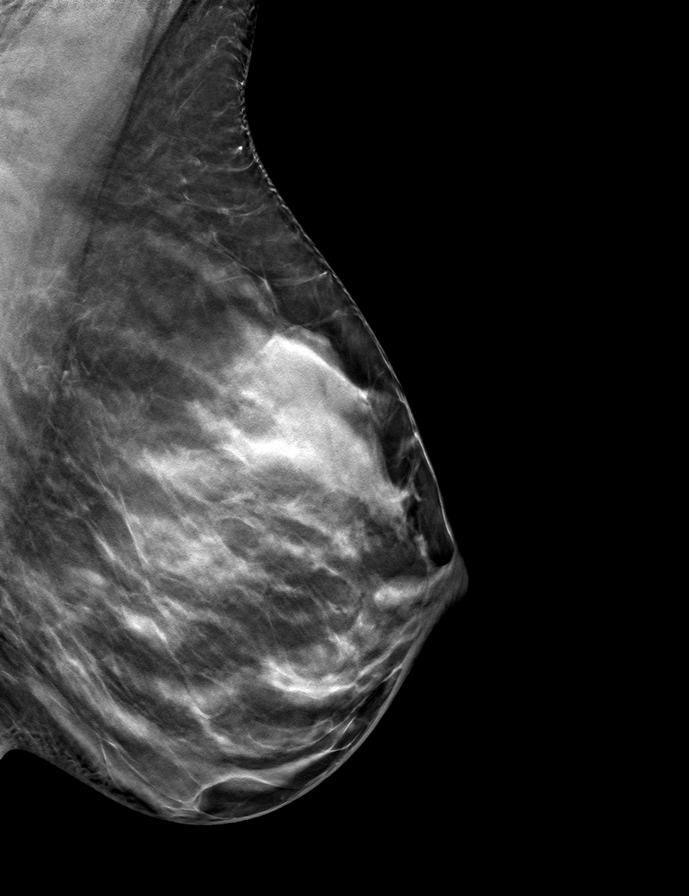

[R CC tomo · tomo slice 30/59.0]
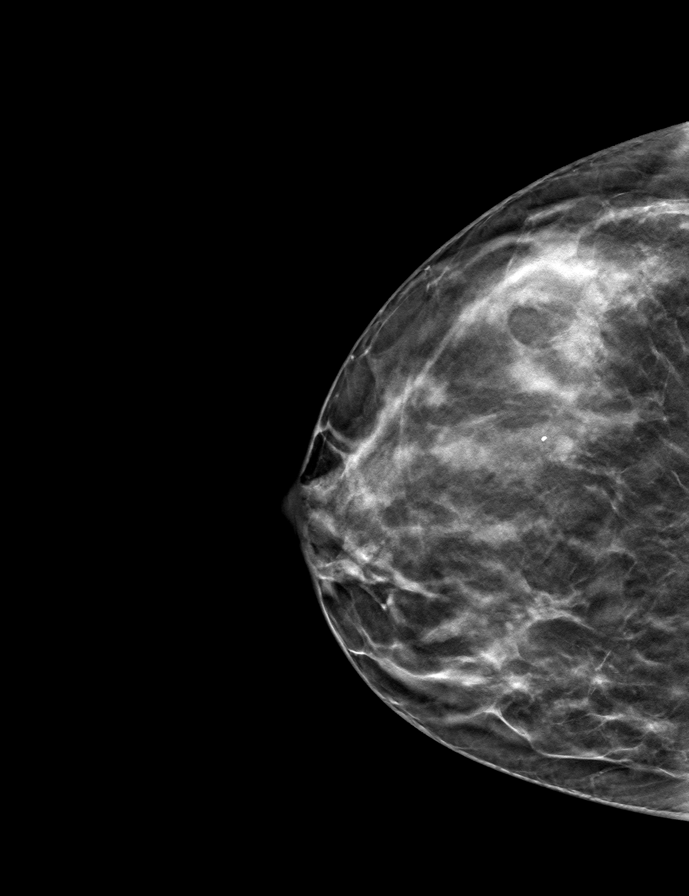

[9 of 24 positions shown; findings below may reference images not displayed]

ACR Breast Density Category c: The breast tissue is heterogeneously
dense, which may obscure small masses.
FINDINGS: There are no findings suspicious for malignancy. Images were
processed with CAD.
IMPRESSION: No mammographic evidence of malignancy. A result letter of this
screening mammogram will be mailed directly to the patient.

RECOMMENDATION:
Screening mammogram in one year. (Code:[5V])

BI-RADS CATEGORY  1: Negative.

## 2019-07-25 ENCOUNTER — Encounter (HOSPITAL_COMMUNITY): Payer: Self-pay

## 2019-12-04 ENCOUNTER — Other Ambulatory Visit: Payer: Self-pay | Admitting: Family Medicine

## 2019-12-04 DIAGNOSIS — Z136 Encounter for screening for cardiovascular disorders: Secondary | ICD-10-CM

## 2019-12-23 ENCOUNTER — Other Ambulatory Visit: Payer: PRIVATE HEALTH INSURANCE

## 2020-01-16 ENCOUNTER — Other Ambulatory Visit: Payer: Self-pay | Admitting: Obstetrics and Gynecology

## 2020-01-16 DIAGNOSIS — Z1231 Encounter for screening mammogram for malignant neoplasm of breast: Secondary | ICD-10-CM

## 2020-01-27 ENCOUNTER — Ambulatory Visit
Admission: RE | Admit: 2020-01-27 | Discharge: 2020-01-27 | Disposition: A | Discharge status: Home / Self Care | Payer: PRIVATE HEALTH INSURANCE | Source: Ambulatory Visit | Attending: Family Medicine | Admitting: Family Medicine

## 2020-01-27 DIAGNOSIS — Z136 Encounter for screening for cardiovascular disorders: Secondary | ICD-10-CM

## 2020-01-27 IMAGING — CT CT CARDIAC CORONARY ARTERY CALCIUM SCORE
3 series · 13 of 20 positions shown, 15 images · non-contrast
Comparison: None.

CLINICAL DATA: Screening

EXAM:
CT CARDIAC CORONARY ARTERY CALCIUM SCORE
TECHNIQUE: Non-contrast imaging through the heart was performed using
prospective ECG gating. Image post processing was performed on an
independent workstation, allowing for quantitative analysis of the
heart and coronary arteries. Note that this exam targets the heart
and the chest was not imaged in its entirety.

[Series 2: calcium scoring 2.00 qr36 bestdiast 70% · axial · 0.30mm/px · z∈[+1612,+1666]mm · 3 of 69 slices shown]
[im 14/69  vessel]
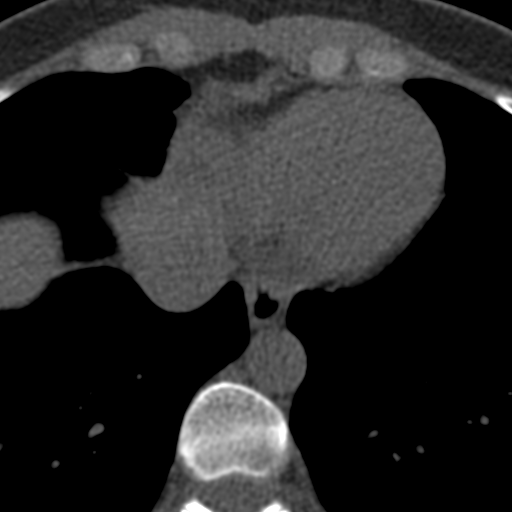
[im 28/69  vessel]
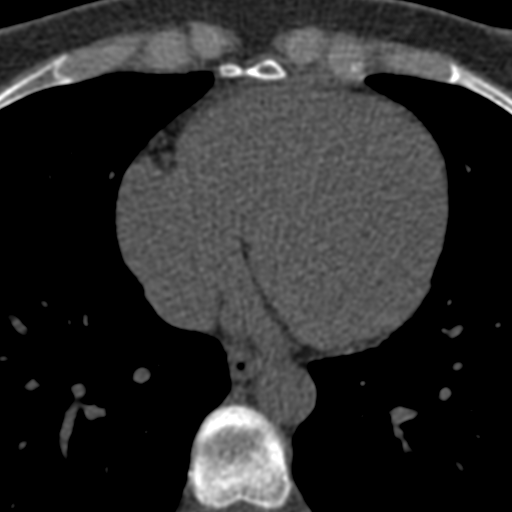
[im 41/69  vessel]
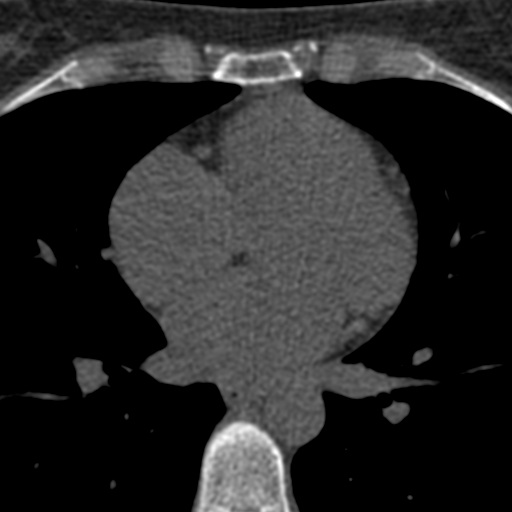

[Series 3: calcium scoring 2.00 br40 bestdiast 70% fov · axial · 0.48mm/px · z∈[+1608,+1698]mm · 5 of 69 slices shown, 7 images]
[im 12/69  vessel]
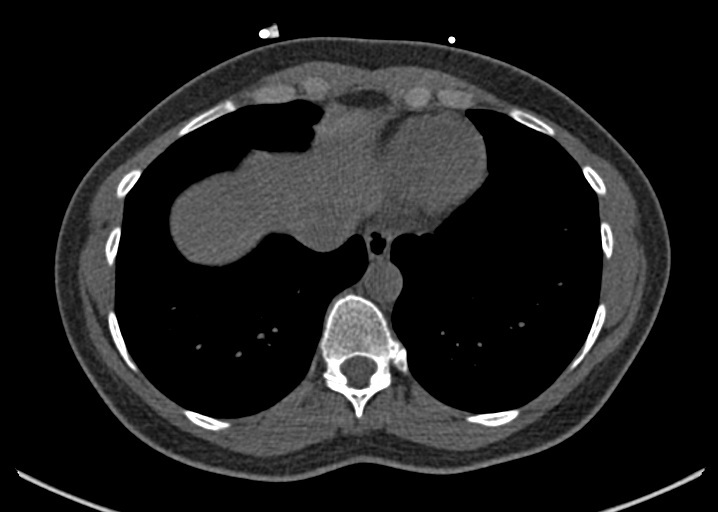
[im 12/69  lung]
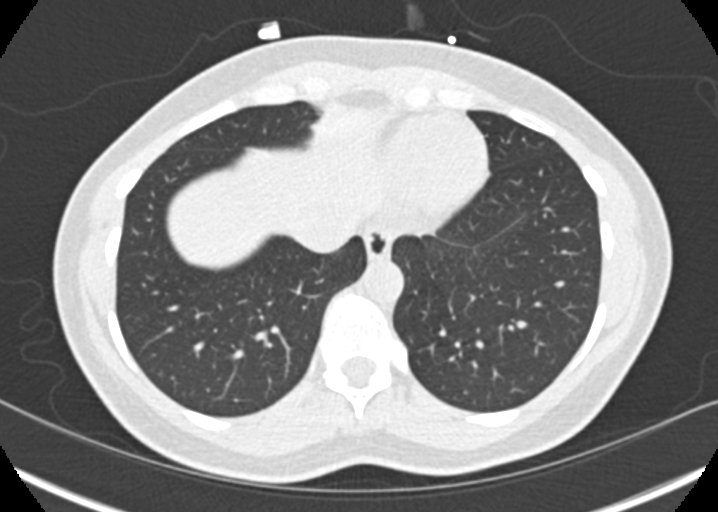
[im 23/69  vessel]
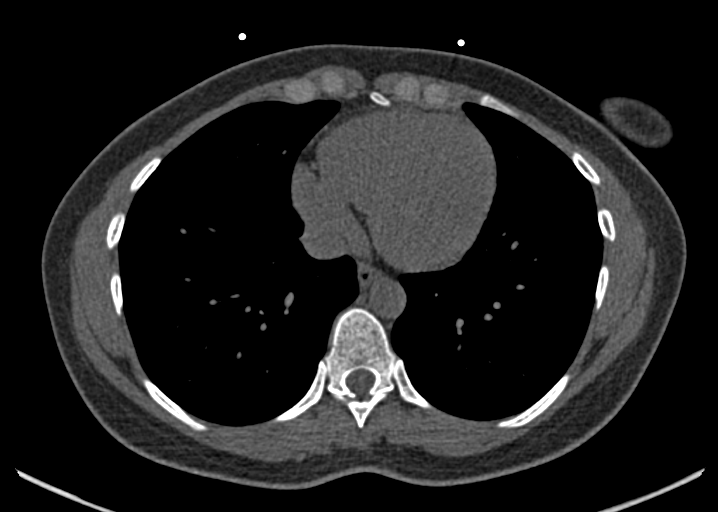
[im 35/69  vessel]
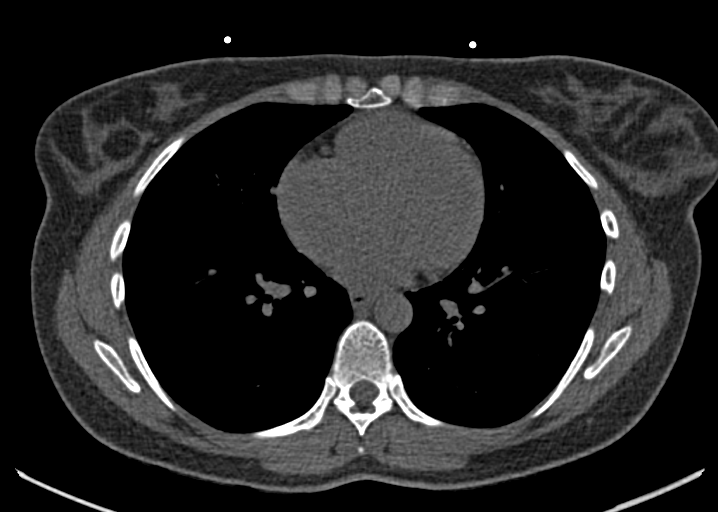
[im 46/69  vessel]
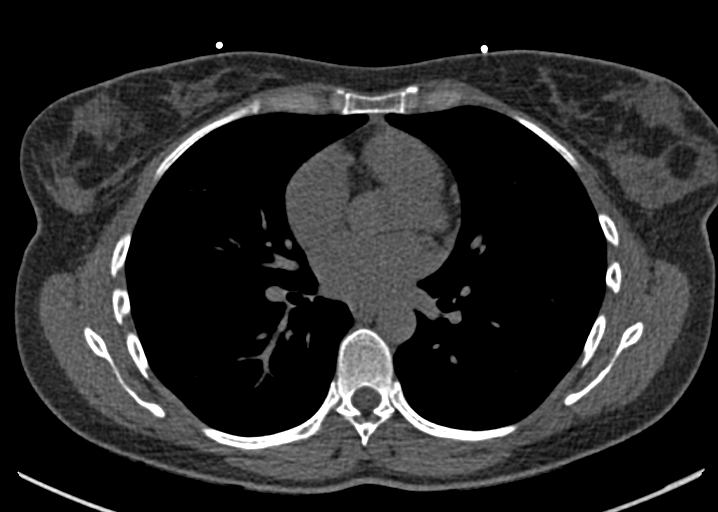
[im 57/69  vessel]
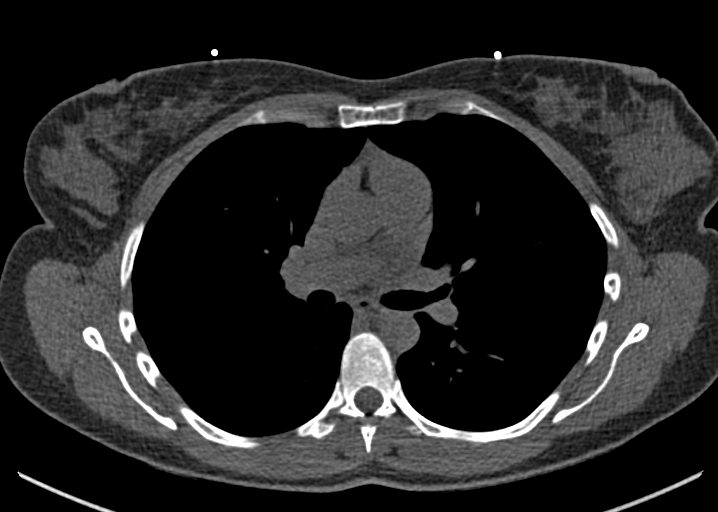
[im 57/69  lung]
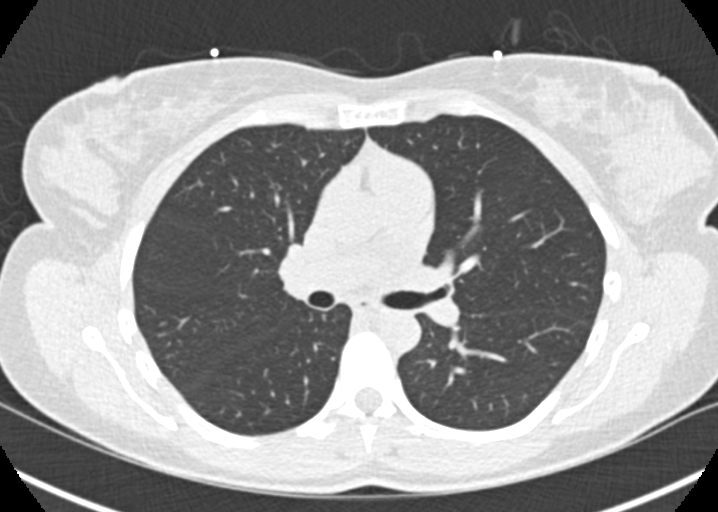

[Series 9: calcium scoring 2.00 br60 bestdiast 70% fov · axial · 0.48mm/px · z∈[+1608,+1698]mm · 5 of 69 slices shown]
[im 12/69  vessel]
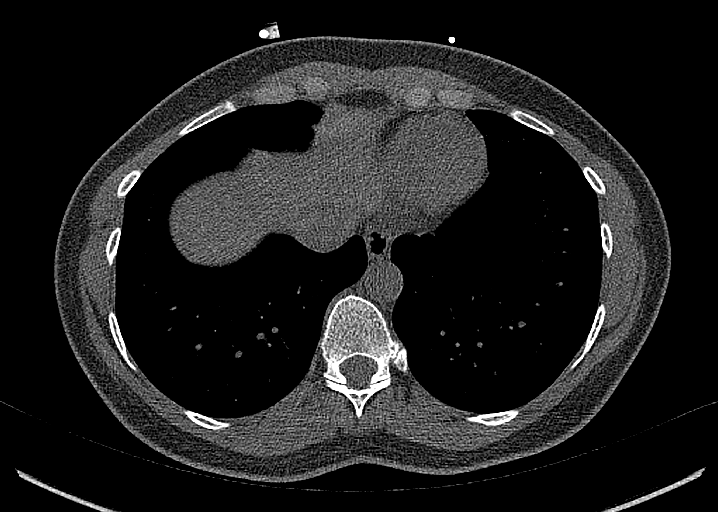
[im 23/69  vessel]
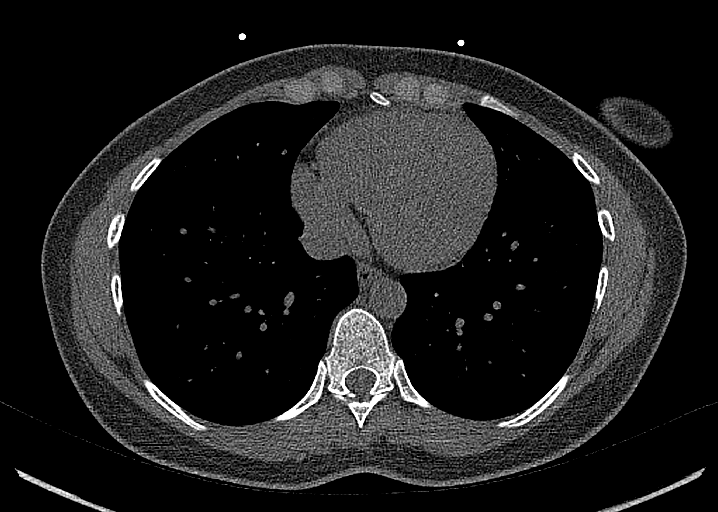
[im 35/69  vessel]
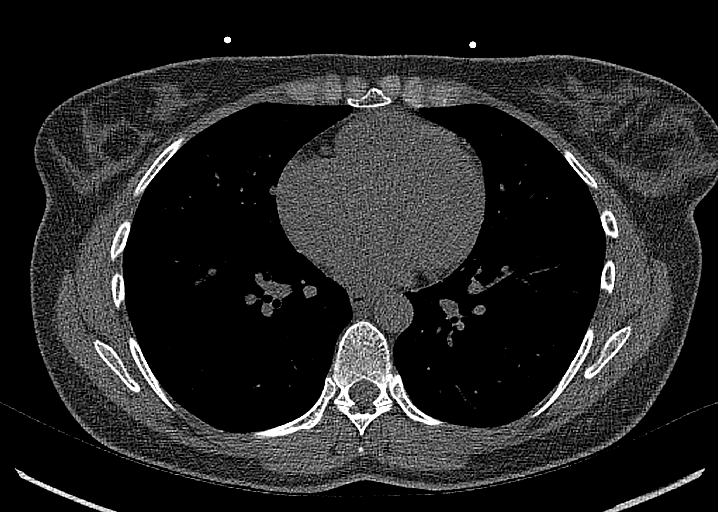
[im 46/69  vessel]
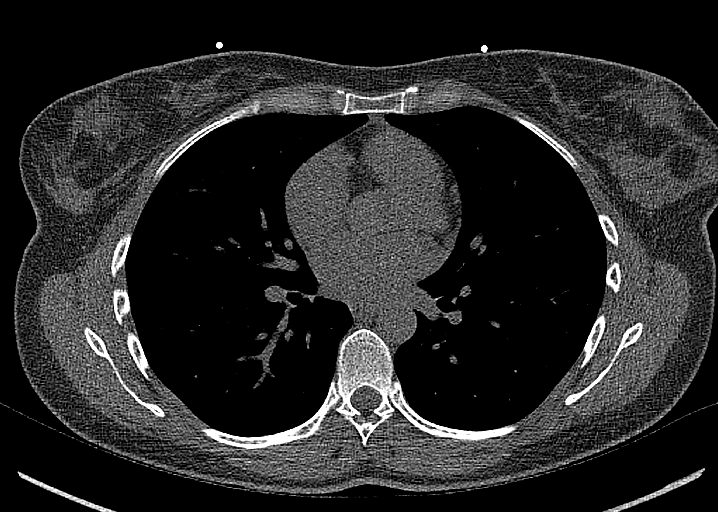
[im 57/69  vessel]
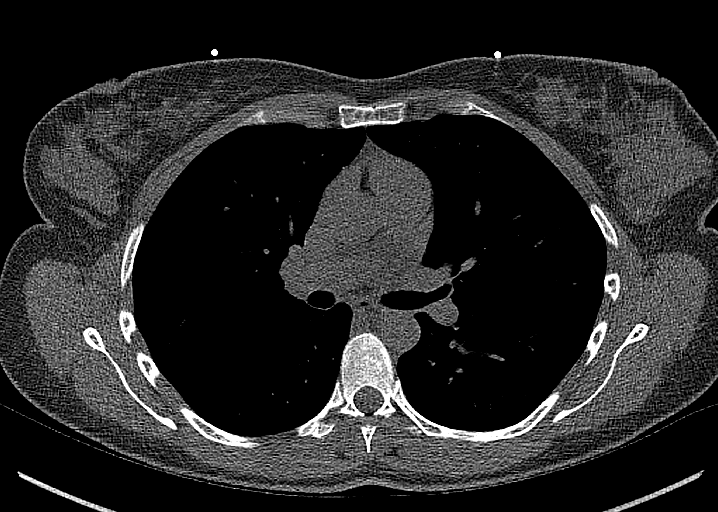

[13 of 20 positions shown; findings below may reference images not displayed]

FINDINGS: CORONARY CALCIUM SCORES:

Left Main: 0

LAD: 0

LCx: 0

RCA: 0

Total Agatston Score: 0

[HOSPITAL] percentile: 0

AORTA MEASUREMENTS:

Ascending Aorta: 25 mm

Descending Aorta: 19 mm

OTHER FINDINGS:

Heart is normal size. No adenopathy in the lower mediastinum or
hila. Visualized lungs clear. No effusions. Imaging into the upper
abdomen shows no acute findings. Chest wall soft tissues are
unremarkable. No acute bony abnormality.
IMPRESSION: No visible coronary artery calcifications. Total coronary calcium
score of 0.

No acute or significant extracardiac abnormality.

## 2020-02-13 ENCOUNTER — Encounter: Payer: Self-pay | Admitting: Podiatry

## 2020-02-13 ENCOUNTER — Ambulatory Visit (INDEPENDENT_AMBULATORY_CARE_PROVIDER_SITE_OTHER): Payer: No Typology Code available for payment source | Admitting: Podiatry

## 2020-02-13 ENCOUNTER — Other Ambulatory Visit: Payer: Self-pay

## 2020-02-13 DIAGNOSIS — B070 Plantar wart: Secondary | ICD-10-CM

## 2020-02-13 NOTE — Progress Notes (Signed)
Subjective:   Patient ID: Tracy Guerra, female   DOB: 44 y.o.   MRN: [id number]   HPI Patient presents with lesion under the right great toe that is been present for approximately 3 years.  States they have been trimming it and its not truly painful but it does seem to have grown somewhat in size and also seems have a small lesion on the dorsal lateral right foot that she wanted checked.  Patient does not smoke and likes to be active   Review of Systems  All other systems reviewed and are negative.       Objective:  Physical Exam Vitals and nursing note reviewed.  Constitutional:      Appearance: She is well-developed.  Pulmonary:     Effort: Pulmonary effort is normal.  Musculoskeletal:        General: Normal range of motion.  Skin:    General: Skin is warm.  Neurological:     Mental Status: She is alert.     Neurovascular status intact muscle strength adequate range of motion within normal limits with patient found to have mass plantar aspect right hallux measuring about 2.1 cm x 1.5 cm with pinpoint bleeding upon debridement and has a very small lesion right lateral ankle that is nontender and appears to be within subcutaneous tissue     Assessment:  Probability for verruca plantaris right and a possible small ganglionic cyst dorsal right     Plan:  H&P reviewed lesion on the bottom of the right hallux and I recommended excision and pathology and I explained procedure risk and the fact it may recur and we do not know what will be until we can see it.  Today I went ahead and I infiltrated the right hallux 60 mg like Marcaine mixture sterile prep applied to the toe and using sterile instrumentation I excised the lesion entirely and applied a small amount of phenol to the base with sterile dressing applied.  I gave instructions for reduced activity and soaks and we are sending this pathology.  Patient will be seen back as needed understanding recovery will take several  weeks

## 2020-02-17 LAB — TISSUE SPECIMEN

## 2020-02-17 LAB — PATHOLOGY REPORT

## 2020-02-24 ENCOUNTER — Ambulatory Visit
Admission: RE | Admit: 2020-02-24 | Discharge: 2020-02-24 | Disposition: A | Discharge status: Home / Self Care | Payer: PRIVATE HEALTH INSURANCE | Source: Ambulatory Visit | Attending: Obstetrics and Gynecology | Admitting: Obstetrics and Gynecology

## 2020-02-24 ENCOUNTER — Other Ambulatory Visit: Payer: Self-pay

## 2020-02-24 DIAGNOSIS — Z1231 Encounter for screening mammogram for malignant neoplasm of breast: Secondary | ICD-10-CM

## 2020-02-24 IMAGING — MG DIGITAL SCREENING BILAT W/ TOMO W/ CAD
8 series · 9 of 24 positions shown · non-contrast
Comparison: Previous exam(s).

CLINICAL DATA: Screening.

EXAM:
DIGITAL SCREENING BILATERAL MAMMOGRAM WITH TOMO AND CAD

[R MLO synth-2D]
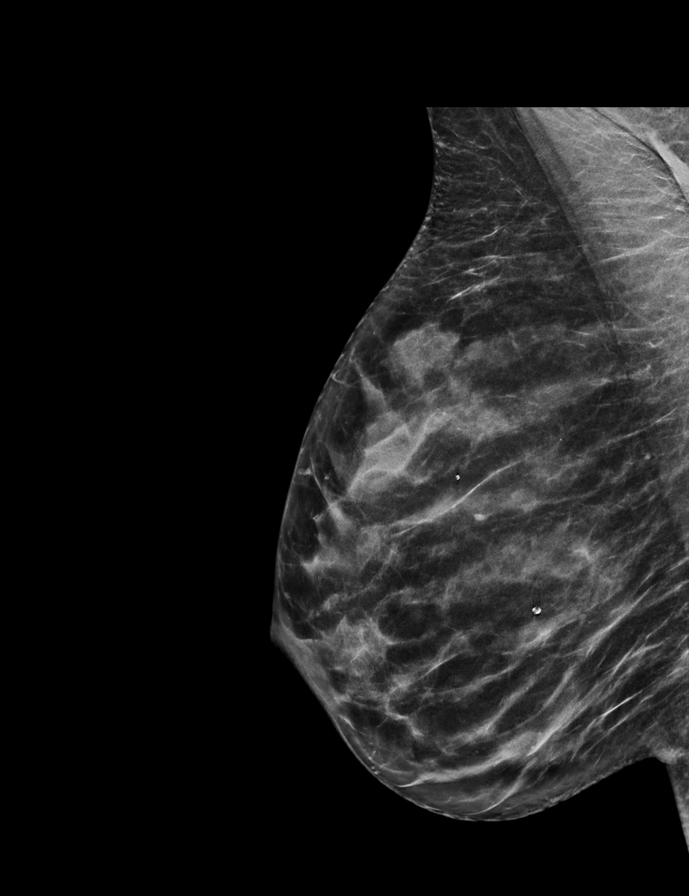

[L CC synth-2D]
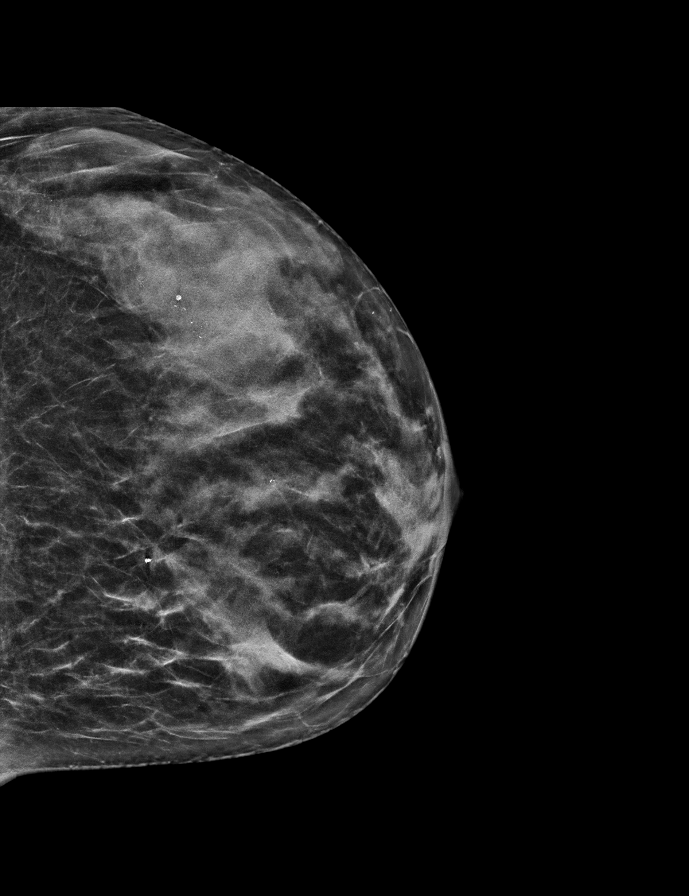

[R CC synth-2D]
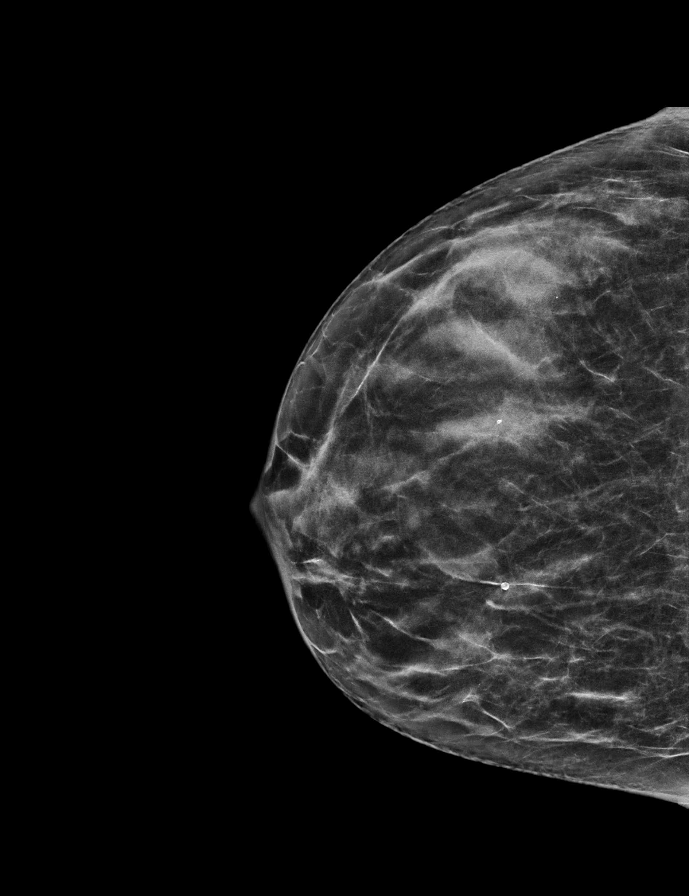

[L MLO synth-2D]
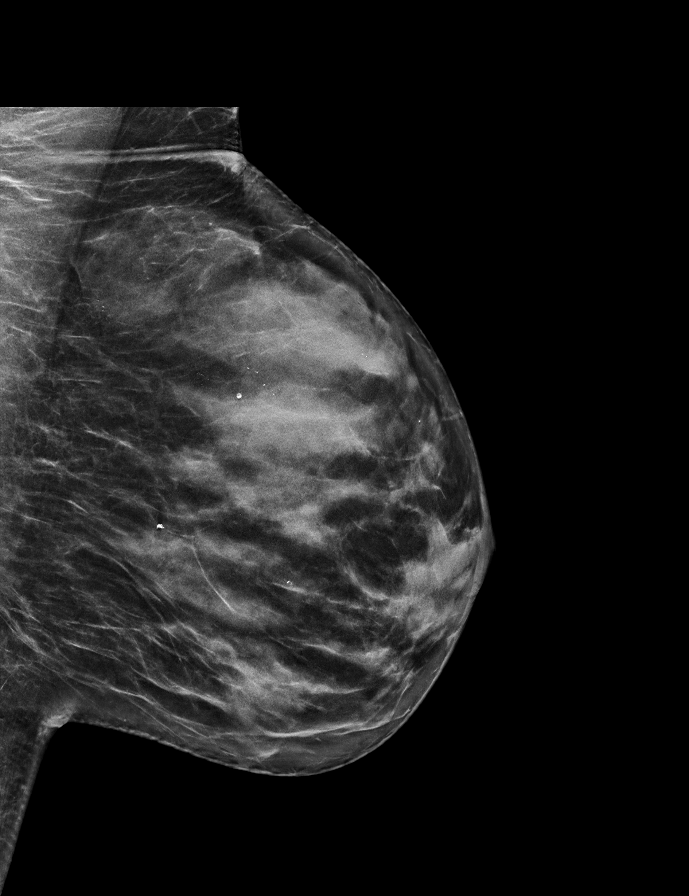

[R MLO tomo · 2 of 58 frames shown]
[frame 19/58]
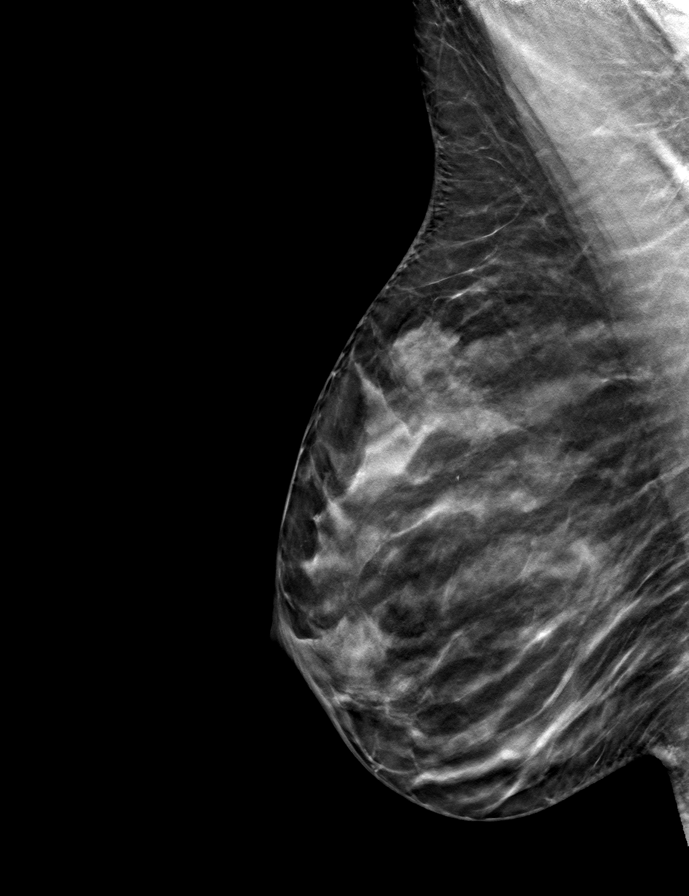
[frame 29/58]
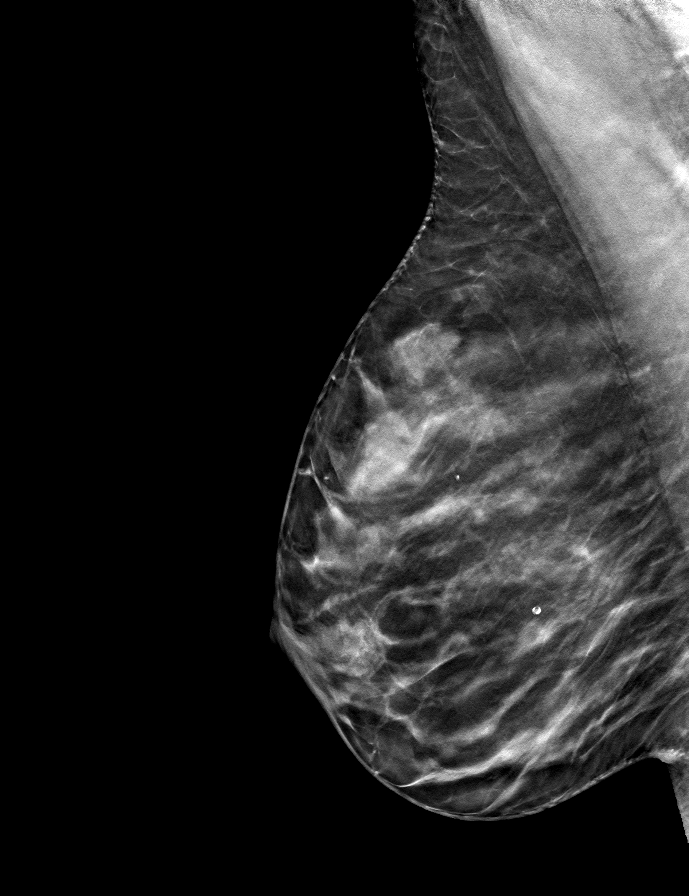

[L MLO tomo · tomo slice 33/64.0]
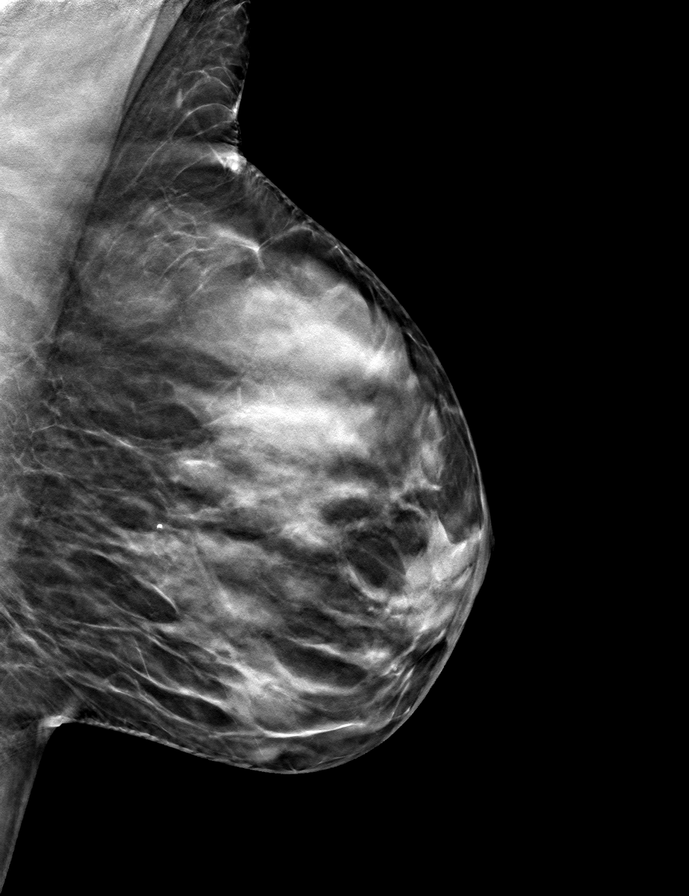

[L CC tomo · tomo slice 28/55.0]
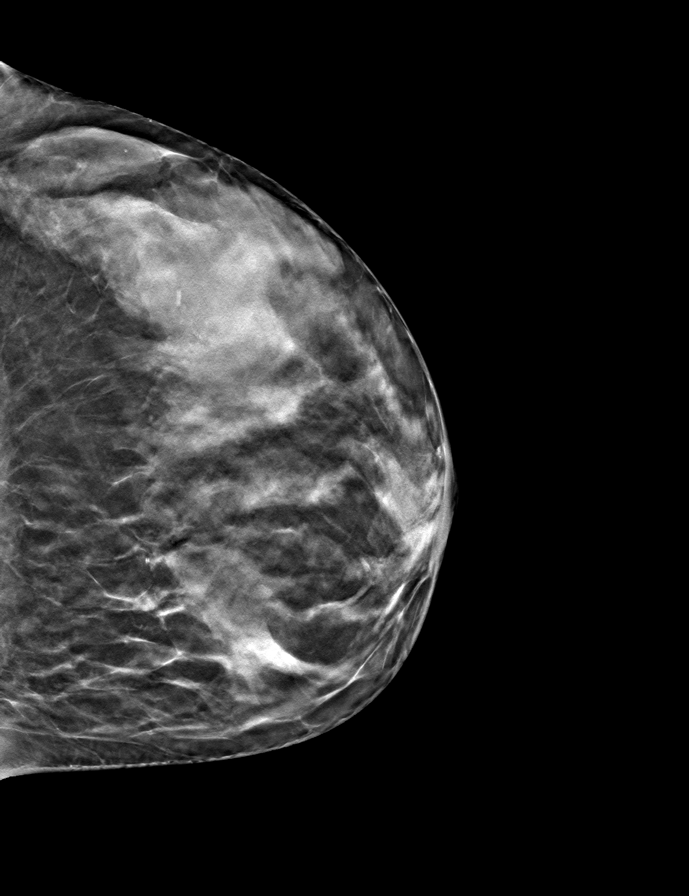

[R CC tomo · tomo slice 27/54.0]
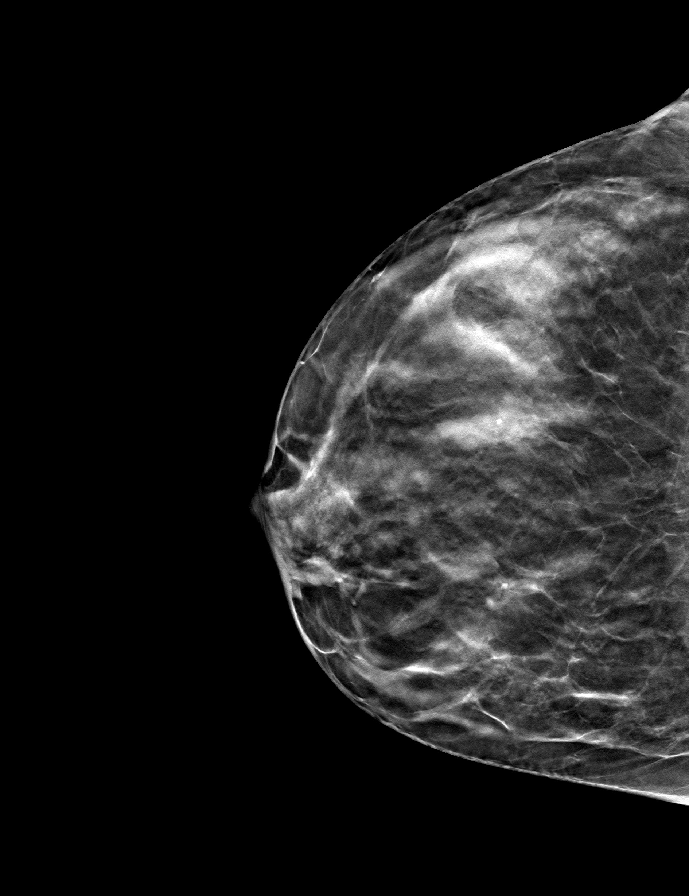

[9 of 24 positions shown; findings below may reference images not displayed]

ACR Breast Density Category c: The breast tissue is heterogeneously
dense, which may obscure small masses.
FINDINGS: In the left breast, calcifications warrant further evaluation. In
the right breast, no findings suspicious for malignancy. Images were
processed with CAD.
IMPRESSION: Further evaluation is suggested for calcifications in the left
breast.

RECOMMENDATION:
Diagnostic mammogram of the left breast. (Code:[B7])

The patient will be contacted regarding the findings, and additional
imaging will be scheduled.

BI-RADS CATEGORY  0: Incomplete. Need additional imaging evaluation
and/or prior mammograms for comparison.

## 2020-02-25 ENCOUNTER — Other Ambulatory Visit: Payer: Self-pay | Admitting: Obstetrics and Gynecology

## 2020-02-25 DIAGNOSIS — R928 Other abnormal and inconclusive findings on diagnostic imaging of breast: Secondary | ICD-10-CM

## 2020-03-02 ENCOUNTER — Ambulatory Visit
Admission: RE | Admit: 2020-03-02 | Discharge: 2020-03-02 | Disposition: A | Discharge status: Home / Self Care | Payer: PRIVATE HEALTH INSURANCE | Source: Ambulatory Visit | Attending: Obstetrics and Gynecology | Admitting: Obstetrics and Gynecology

## 2020-03-02 ENCOUNTER — Other Ambulatory Visit: Payer: Self-pay

## 2020-03-02 ENCOUNTER — Other Ambulatory Visit: Payer: Self-pay | Admitting: Obstetrics and Gynecology

## 2020-03-02 DIAGNOSIS — R928 Other abnormal and inconclusive findings on diagnostic imaging of breast: Secondary | ICD-10-CM

## 2020-03-02 DIAGNOSIS — R921 Mammographic calcification found on diagnostic imaging of breast: Secondary | ICD-10-CM

## 2020-03-02 IMAGING — MG DIGITAL DIAGNOSTIC UNILAT LEFT W/ CAD
3 series · 3 of 3 positions shown · non-contrast
Comparison: Previous exam(s).

CLINICAL DATA: Patient returns today to evaluate LEFT breast
calcifications identified on a recent screening mammogram.

EXAM:
DIGITAL DIAGNOSTIC LEFT MAMMOGRAM WITH CAD

[L ML (1 of 2)]
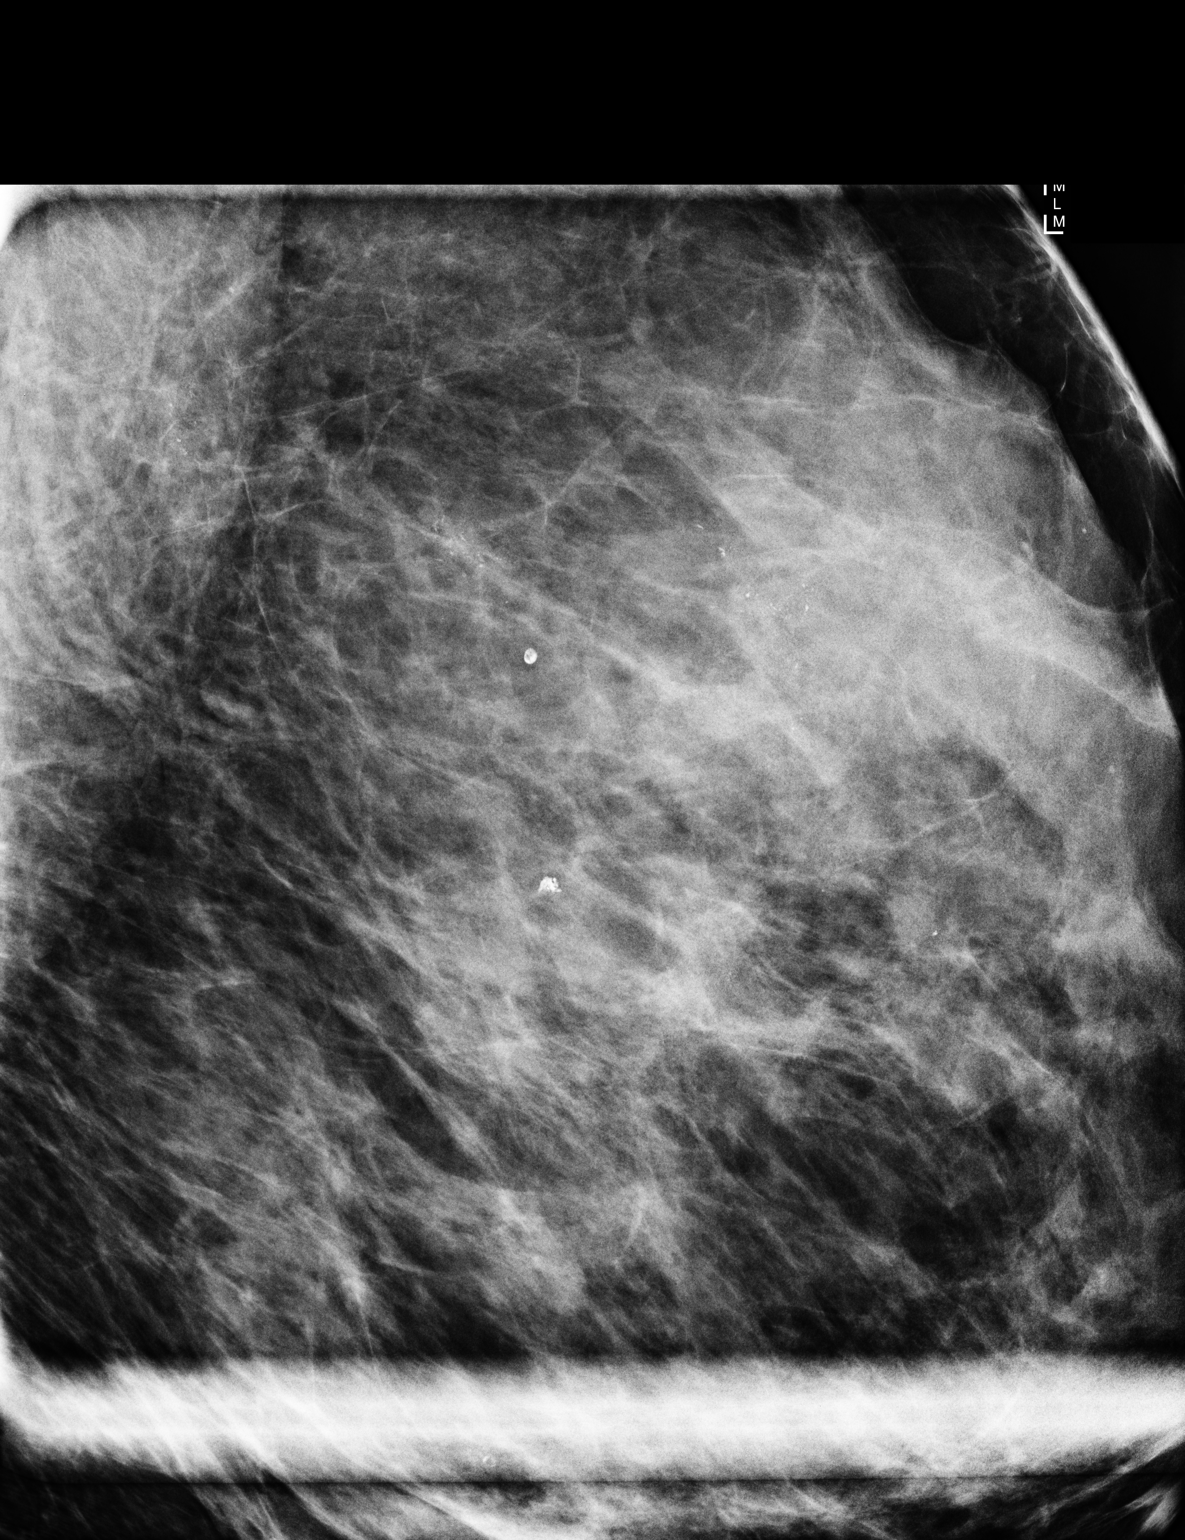

[L CC]
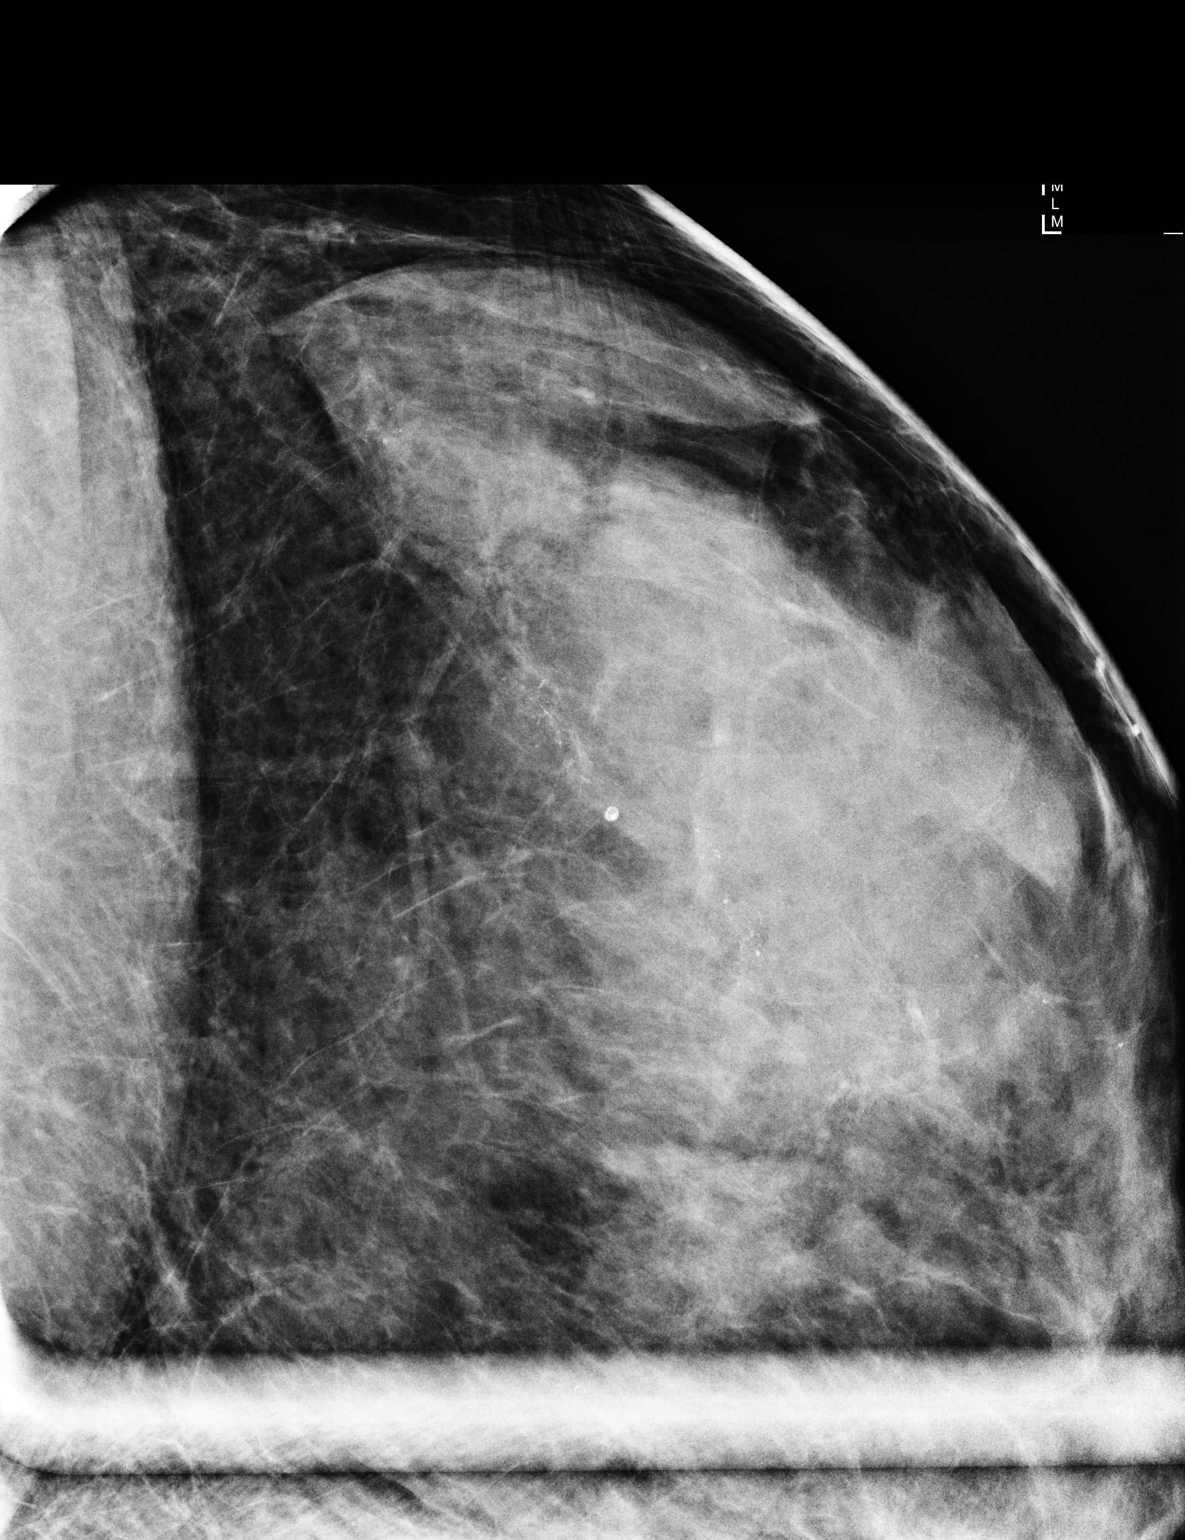

[L ML (2 of 2)]
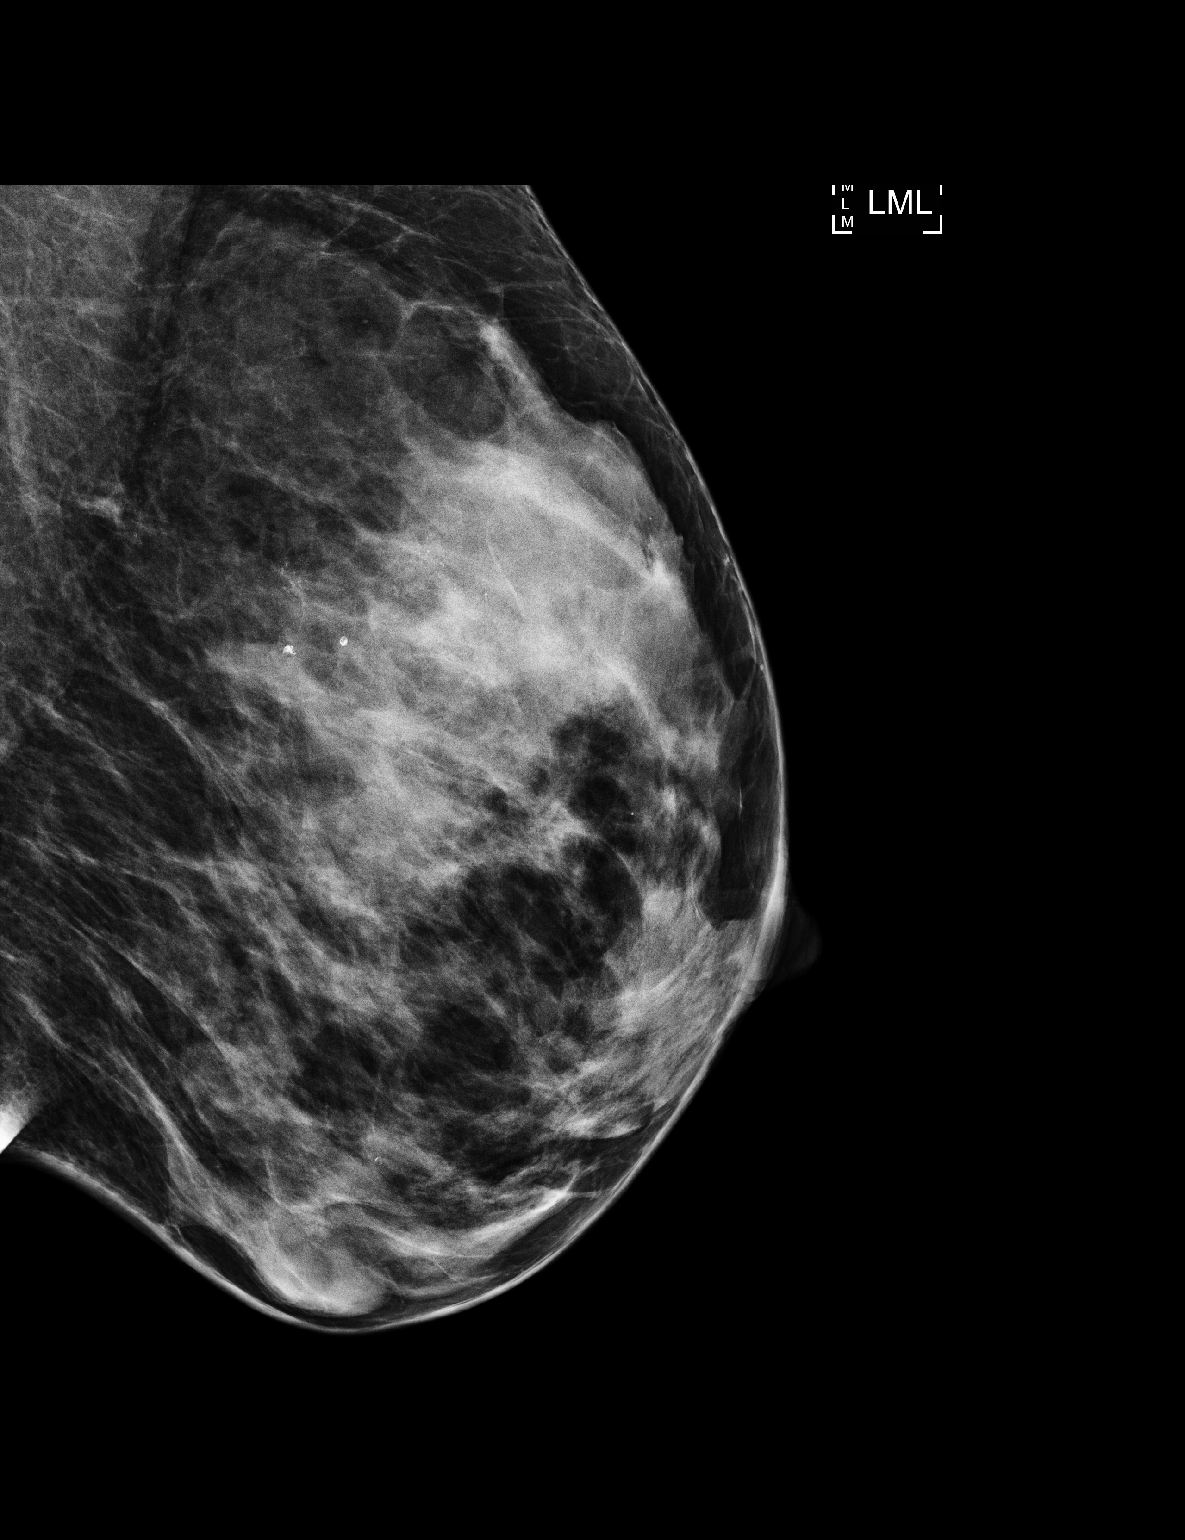

[3 of 3 positions shown; findings below may reference images not displayed]

ACR Breast Density Category c: The breast tissue is heterogeneously
dense, which may obscure small masses.
FINDINGS: On today's additional diagnostic views, including magnification
views, there are new punctate, amorphous and fine linear branching
calcifications confirmed within the the LEFT breast extending from
posterior to middle. The extent of the most prominent/contiguous
component, at middle depth, measures 3.1 cm. The overall extent,
including a 4 mm group of amorphous calcifications at posterior
depth, measures 5.8 cm extent.

Mammographic images were processed with CAD.
IMPRESSION: New suspicious calcifications within the upper-outer quadrant of the
LEFT breast, extending from posterior to middle depth, overall
measuring 5.8 cm extent, most prominent/contiguous component at
middle depth measuring 3.1 cm extent. Stereotactic biopsy is
recommended with sampling of the anterior and posterior extent.

RECOMMENDATION:
1. Stereotactic biopsies of the anterior and posterior extent of the
suspicious calcifications in the upper-outer quadrant of the LEFT
breast.
2. If either are benign, would then recommend additional
stereotactic biopsy of the middle component, which have the most
suspicious fine linear branching morphology, to further define
extent.

Stereotactic biopsies are scheduled for [REDACTED].

I have discussed the findings and recommendations with the patient.
If applicable, a reminder letter will be sent to the patient
regarding the next appointment.

BI-RADS CATEGORY  4: Suspicious.

## 2020-03-10 ENCOUNTER — Ambulatory Visit
Admission: RE | Admit: 2020-03-10 | Discharge: 2020-03-10 | Disposition: A | Discharge status: Home / Self Care | Payer: PRIVATE HEALTH INSURANCE | Source: Ambulatory Visit | Attending: Obstetrics and Gynecology | Admitting: Obstetrics and Gynecology

## 2020-03-10 ENCOUNTER — Other Ambulatory Visit: Payer: Self-pay

## 2020-03-10 DIAGNOSIS — R921 Mammographic calcification found on diagnostic imaging of breast: Secondary | ICD-10-CM

## 2020-03-10 IMAGING — MG MM BREAST BX W LOC DEV EA AD LESION IMG BX SPEC STEREO GUIDE*L*
7 of 27 series · 7 of 35 positions shown · non-contrast
Comparison: Previous exams.
COMPARISON: Previous exams.

Addendum:
CLINICAL DATA: 44-year-old female presenting for biopsy of left
breast calcifications.

EXAM:
LEFT BREAST STEREOTACTIC CORE NEEDLE BIOPSY x 2

[L (1 of 7)]
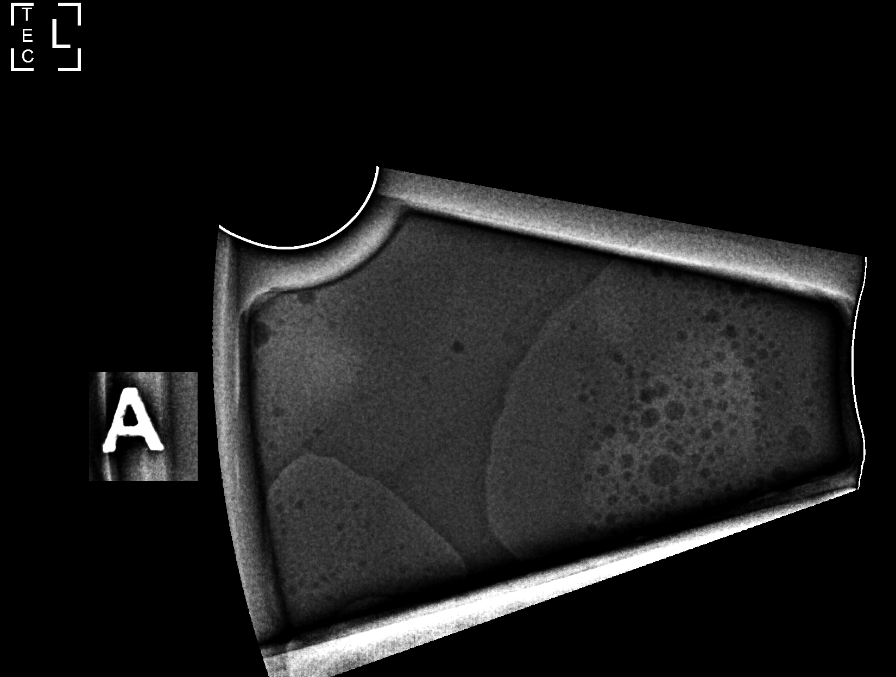

[L (2 of 7)]
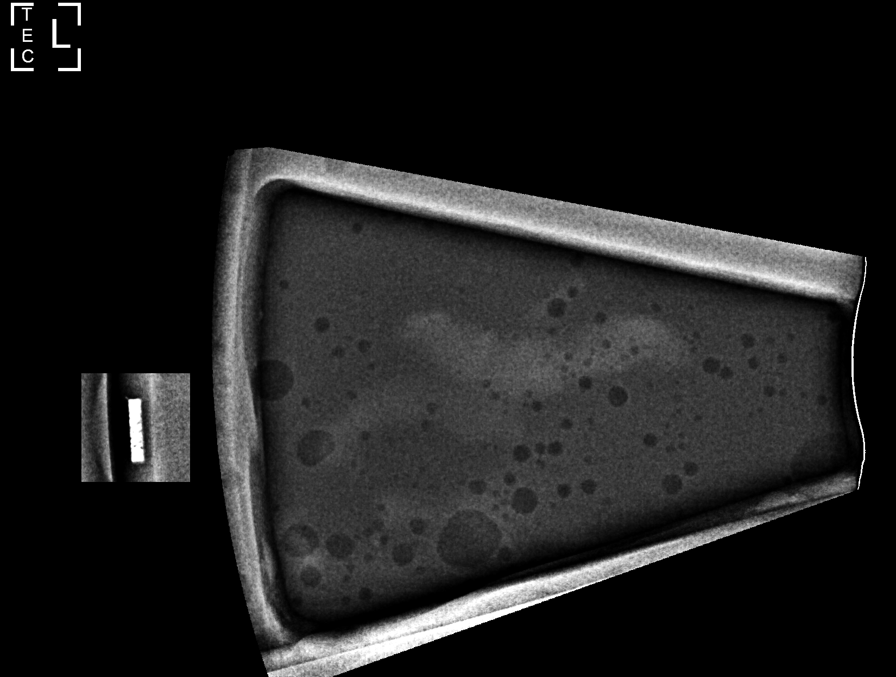

[L (3 of 7)]
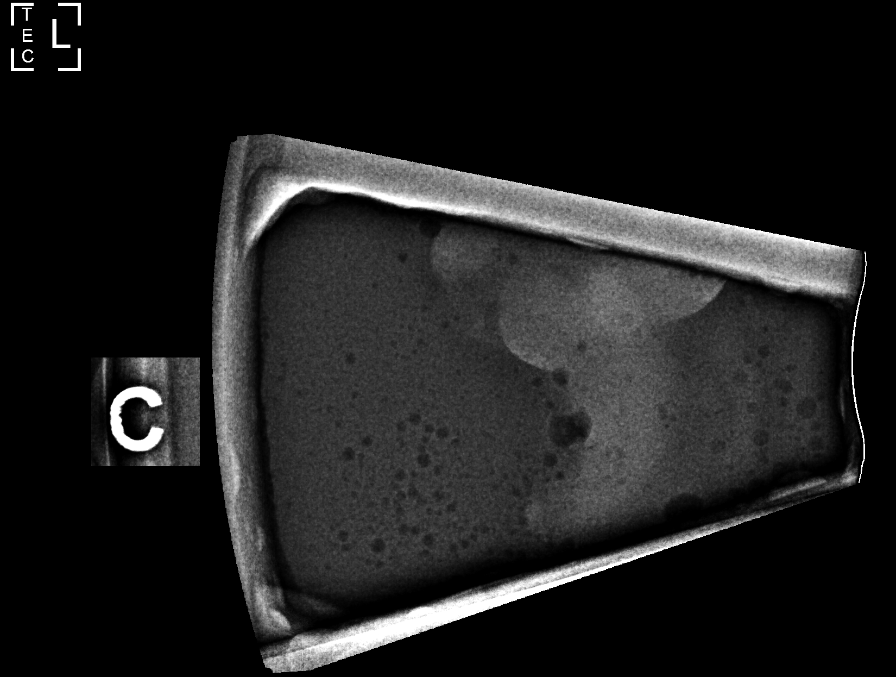

[L (4 of 7)]
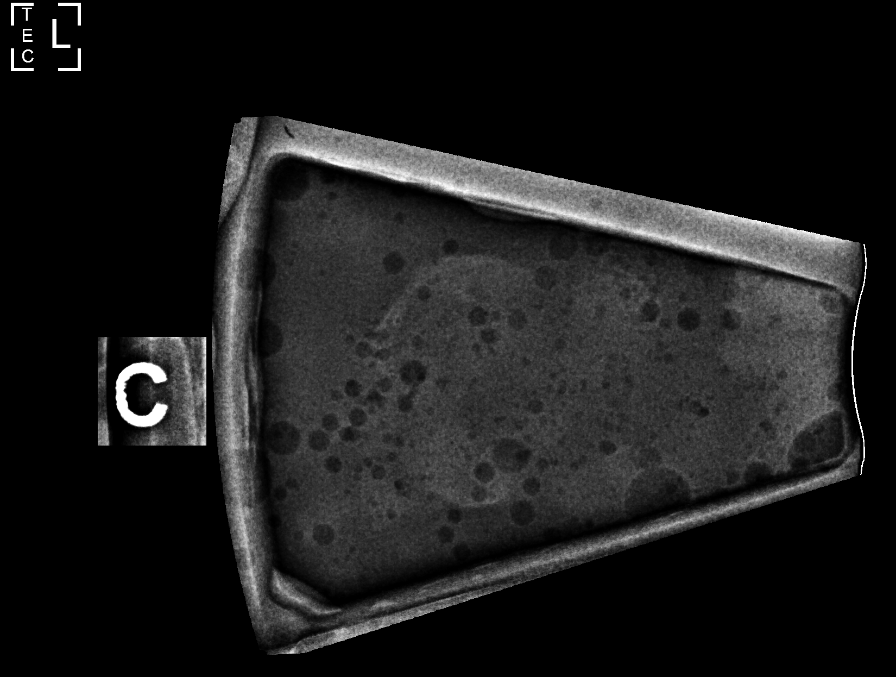

[L (5 of 7)]
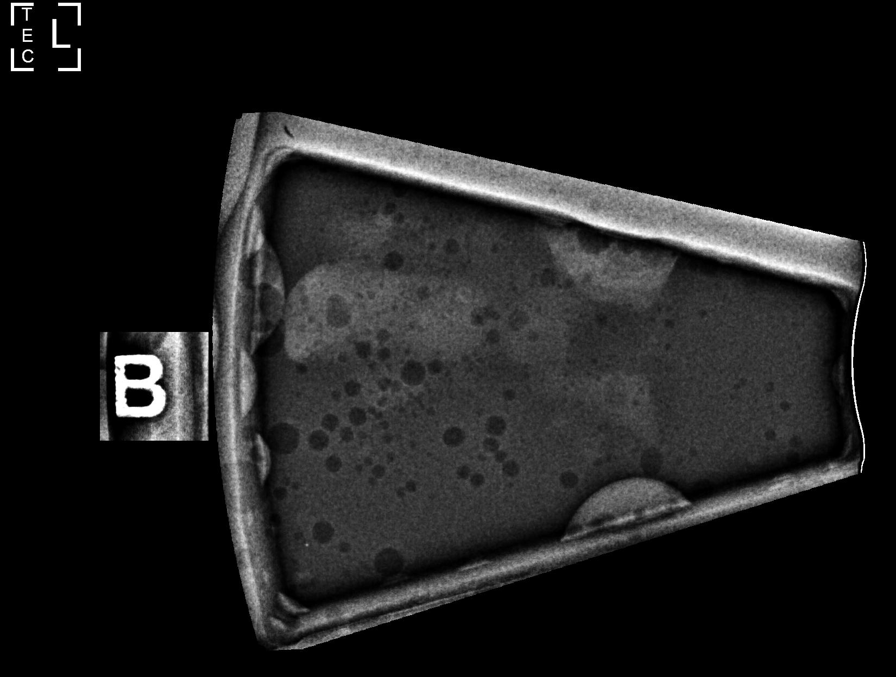

[L (6 of 7)]
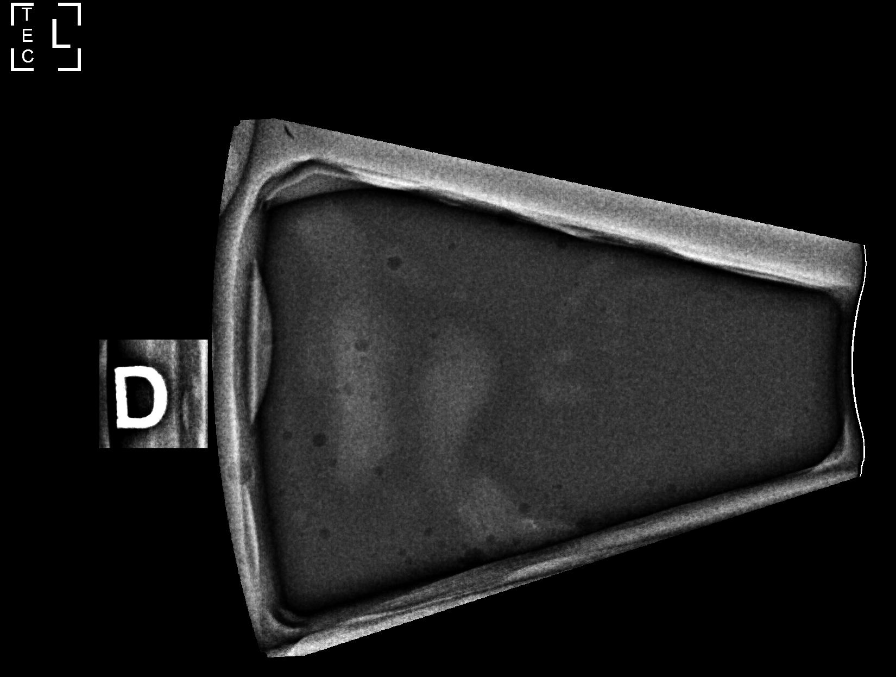

[L (7 of 7)]
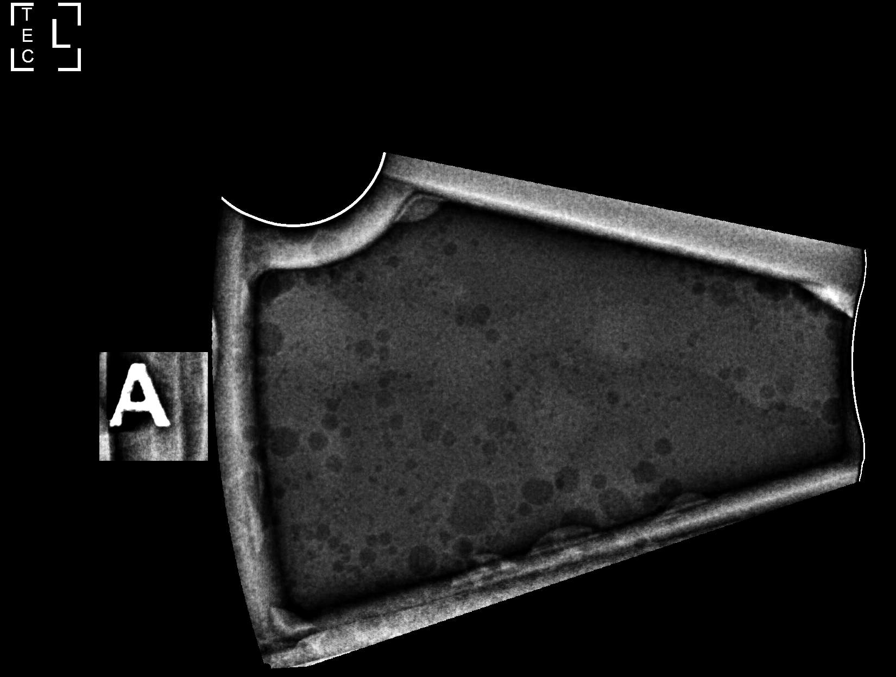

[7 of 35 positions shown; findings below may reference images not displayed]



1. Using sterile technique and 1% Lidocaine as local anesthetic,
under stereotactic guidance, a 9 gauge vacuum assisted device was
used to perform core needle biopsy of calcifications in the upper
outer left breast, posterior using a superior approach. Specimen
radiograph was performed showing at least 4 specimens with
calcifications. Specimens with calcifications are identified for
pathology.

Lesion quadrant: Upper outer quadrant

At the conclusion of the procedure, a coil tissue marker clip was
deployed into the biopsy cavity. Follow-up 2-view mammogram was
performed and dictated separately.

2. Using sterile technique and 1% Lidocaine as local anesthetic,
under stereotactic guidance, a 9 gauge vacuum assisted device was
used to perform core needle biopsy of calcifications in the upper
outer left breast, anterior using a superior approach. Specimen
radiograph was performed showing at least 3 specimens with
calcifications. Specimens with calcifications are identified for
pathology.

Lesion quadrant: Upper outer quadrant

At the conclusion of the procedure, an X tissue marker clip was
deployed into the biopsy cavity. Follow-up 2-view mammogram was
performed and dictated separately.
IMPRESSION: Stereotactic-guided biopsy of calcifications in the upper outer left
breast at both the anterior and posterior aspect. No apparent
complications.

ADDENDUM:
Pathology revealed INTERMEDIATE GRADE DUCTAL CARCINOMA IN SITU WITH
NECROSIS AND CALCIFICATIONS of the LEFT breast, upper outer
posterior. This was found to be concordant by Dr. KLPIGBB.

Pathology revealed INTERMEDIATE GRADE DUCTAL CARCINOMA IN SITU WITH
CALCIFICATIONS of the LEFT breast, upper outer anterior. This was
found to be concordant by Dr. KLPIGBB.

Pathology results were discussed with the patient by telephone. The
patient reported doing well after the biopsies with tenderness at
the sites. Post biopsy instructions and care were reviewed and
questions were answered. The patient was encouraged to call The

Breast MRI is recommended for extent of disease.

Surgical consultation has been arranged with Dr. KLPIGBB
at [REDACTED] on [DATE].

Pathology results reported by KLPIGBB RN on [DATE].



1. Using sterile technique and 1% Lidocaine as local anesthetic,
under stereotactic guidance, a 9 gauge vacuum assisted device was
used to perform core needle biopsy of calcifications in the upper
outer left breast, posterior using a superior approach. Specimen
radiograph was performed showing at least 4 specimens with
calcifications. Specimens with calcifications are identified for
pathology.

Lesion quadrant: Upper outer quadrant

At the conclusion of the procedure, a coil tissue marker clip was
deployed into the biopsy cavity. Follow-up 2-view mammogram was
performed and dictated separately.

2. Using sterile technique and 1% Lidocaine as local anesthetic,
under stereotactic guidance, a 9 gauge vacuum assisted device was
used to perform core needle biopsy of calcifications in the upper
outer left breast, anterior using a superior approach. Specimen
radiograph was performed showing at least 3 specimens with
calcifications. Specimens with calcifications are identified for
pathology.

Lesion quadrant: Upper outer quadrant

At the conclusion of the procedure, an X tissue marker clip was
deployed into the biopsy cavity. Follow-up 2-view mammogram was
performed and dictated separately.
IMPRESSION: Stereotactic-guided biopsy of calcifications in the upper outer left
breast at both the anterior and posterior aspect. No apparent
complications.

## 2020-03-10 IMAGING — MG MM BREAST BX W LOC DEV 1ST LESION IMAGE BX SPEC STEREO GUIDE*L*
7 of 27 series · 7 of 40 positions shown · non-contrast
Comparison: Previous exams.
COMPARISON: Previous exams.

Addendum:
CLINICAL DATA: 44-year-old female presenting for biopsy of left
breast calcifications.

EXAM:
LEFT BREAST STEREOTACTIC CORE NEEDLE BIOPSY x 2

[L (1 of 7)]
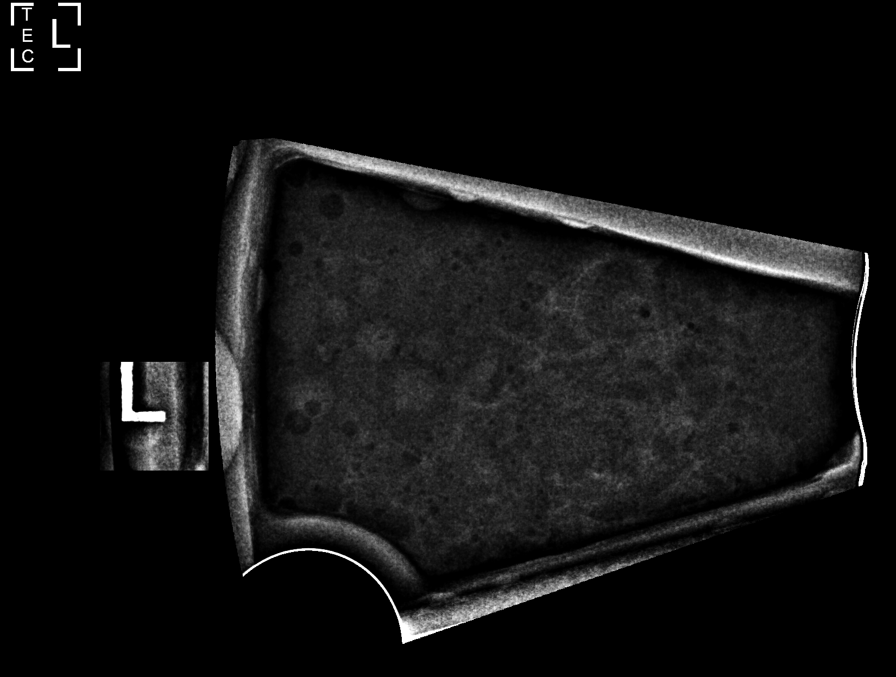

[L (2 of 7)]
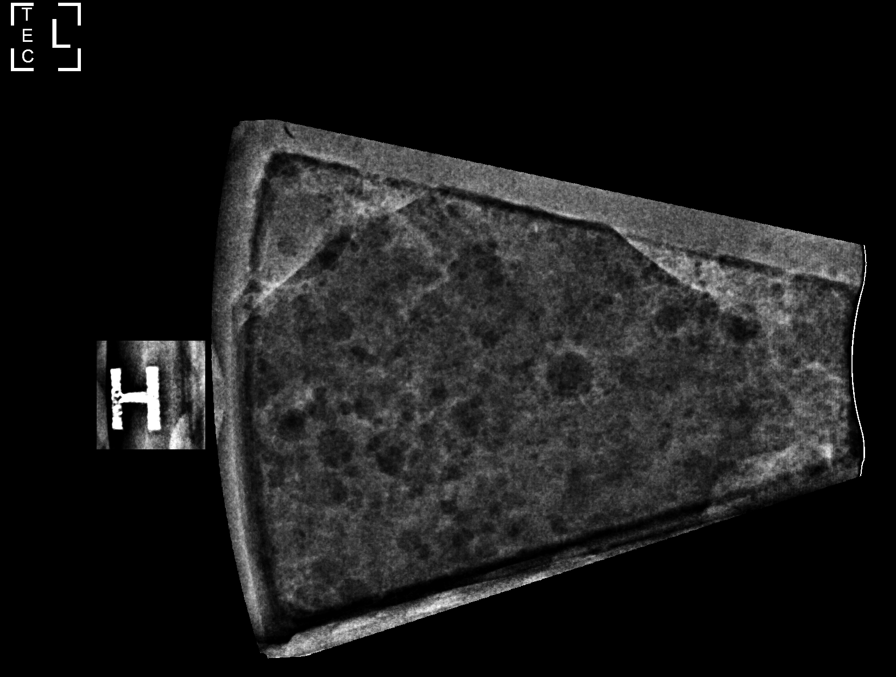

[L (3 of 7)]
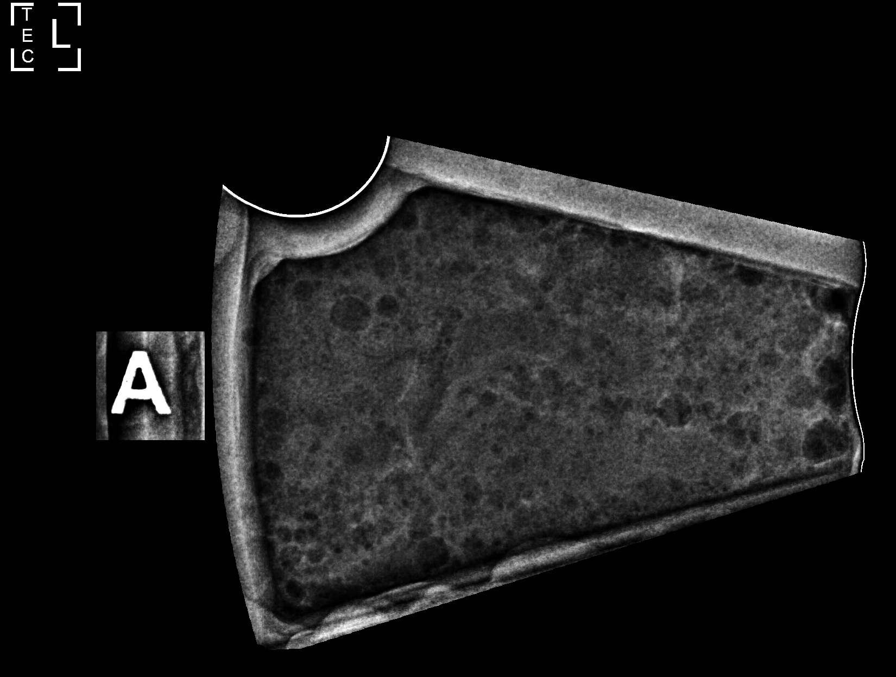

[L (4 of 7)]
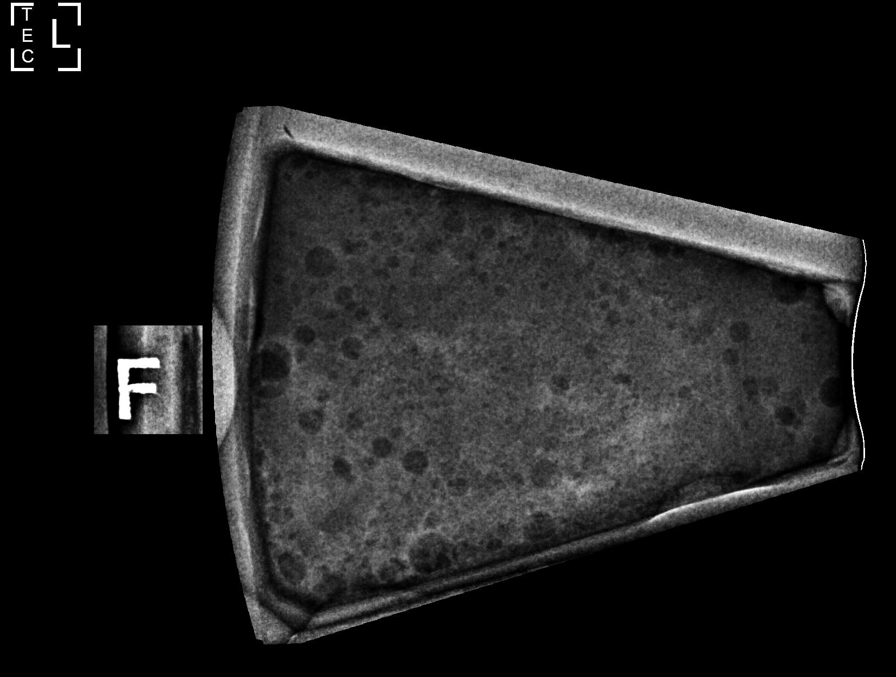

[L (5 of 7)]
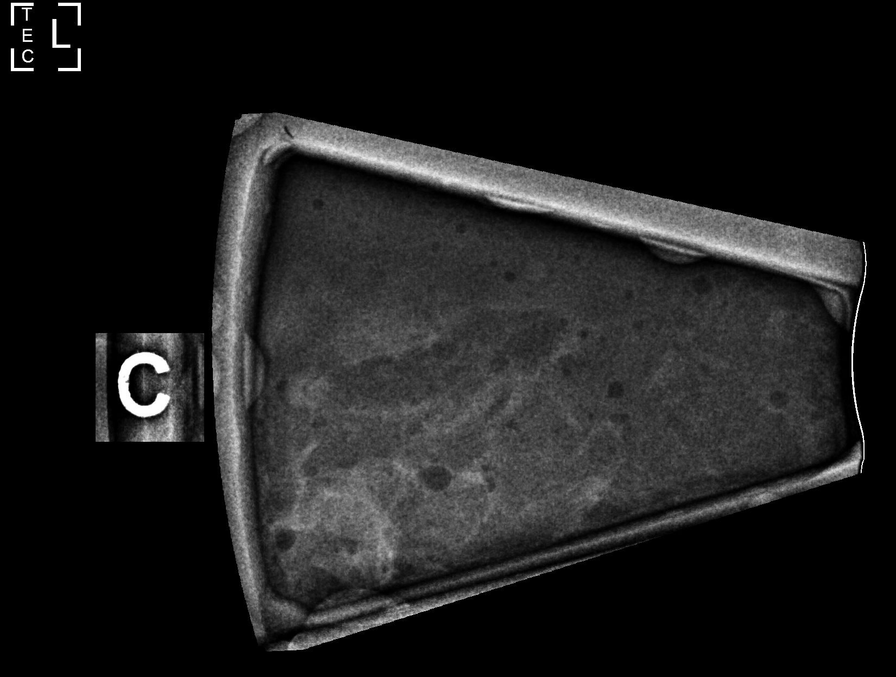

[L (6 of 7)]
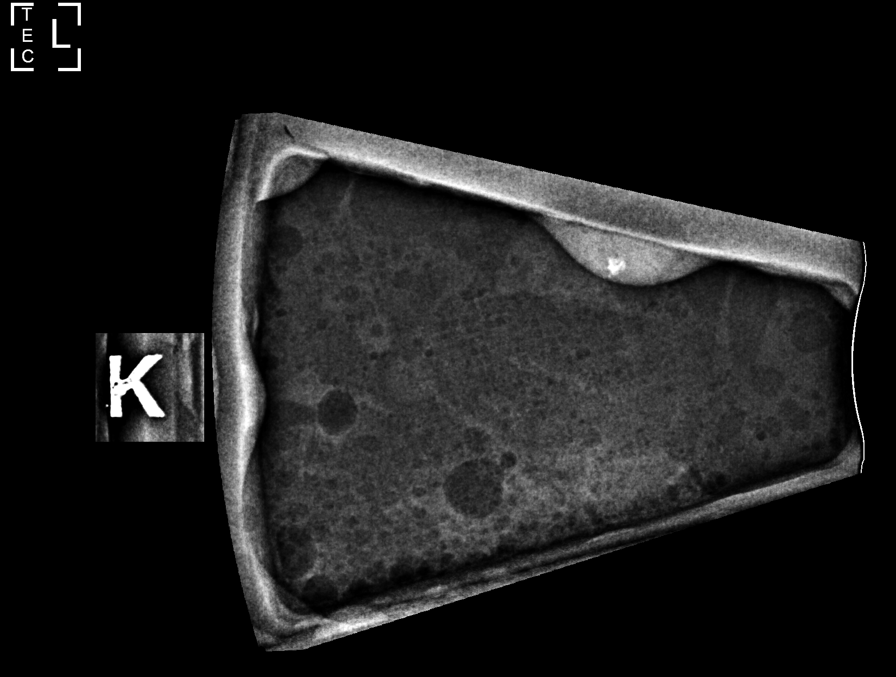

[L (7 of 7)]
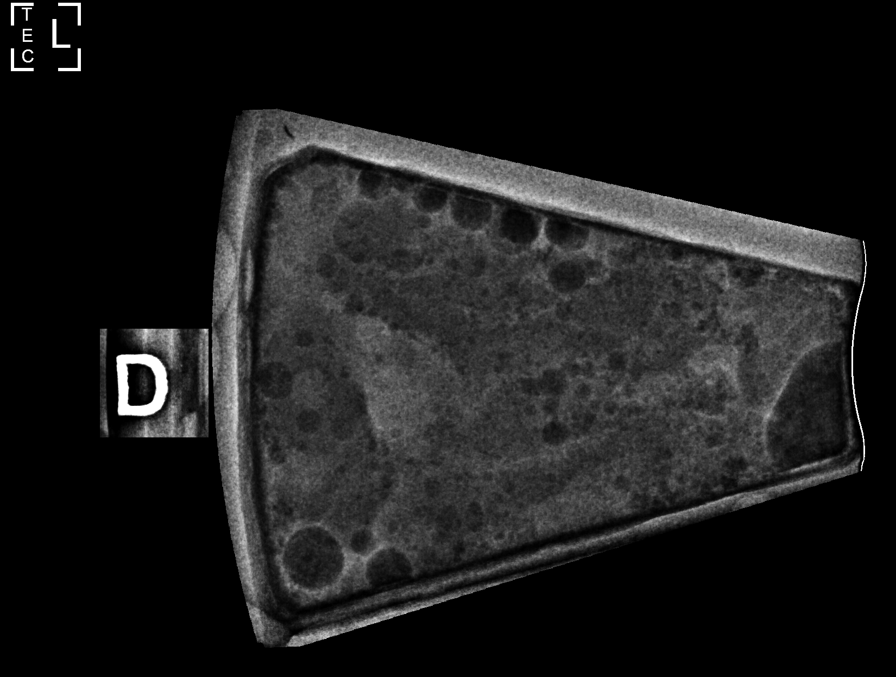

[7 of 40 positions shown; findings below may reference images not displayed]



1. Using sterile technique and 1% Lidocaine as local anesthetic,
under stereotactic guidance, a 9 gauge vacuum assisted device was
used to perform core needle biopsy of calcifications in the upper
outer left breast, posterior using a superior approach. Specimen
radiograph was performed showing at least 4 specimens with
calcifications. Specimens with calcifications are identified for
pathology.

Lesion quadrant: Upper outer quadrant

At the conclusion of the procedure, a coil tissue marker clip was
deployed into the biopsy cavity. Follow-up 2-view mammogram was
performed and dictated separately.

2. Using sterile technique and 1% Lidocaine as local anesthetic,
under stereotactic guidance, a 9 gauge vacuum assisted device was
used to perform core needle biopsy of calcifications in the upper
outer left breast, anterior using a superior approach. Specimen
radiograph was performed showing at least 3 specimens with
calcifications. Specimens with calcifications are identified for
pathology.

Lesion quadrant: Upper outer quadrant

At the conclusion of the procedure, an X tissue marker clip was
deployed into the biopsy cavity. Follow-up 2-view mammogram was
performed and dictated separately.
IMPRESSION: Stereotactic-guided biopsy of calcifications in the upper outer left
breast at both the anterior and posterior aspect. No apparent
complications.

ADDENDUM:
Pathology revealed INTERMEDIATE GRADE DUCTAL CARCINOMA IN SITU WITH
NECROSIS AND CALCIFICATIONS of the LEFT breast, upper outer
posterior. This was found to be concordant by Dr. KLPIGBB.

Pathology revealed INTERMEDIATE GRADE DUCTAL CARCINOMA IN SITU WITH
CALCIFICATIONS of the LEFT breast, upper outer anterior. This was
found to be concordant by Dr. KLPIGBB.

Pathology results were discussed with the patient by telephone. The
patient reported doing well after the biopsies with tenderness at
the sites. Post biopsy instructions and care were reviewed and
questions were answered. The patient was encouraged to call The

Breast MRI is recommended for extent of disease.

Surgical consultation has been arranged with Dr. KLPIGBB
at [REDACTED] on [DATE].

Pathology results reported by KLPIGBB RN on [DATE].



1. Using sterile technique and 1% Lidocaine as local anesthetic,
under stereotactic guidance, a 9 gauge vacuum assisted device was
used to perform core needle biopsy of calcifications in the upper
outer left breast, posterior using a superior approach. Specimen
radiograph was performed showing at least 4 specimens with
calcifications. Specimens with calcifications are identified for
pathology.

Lesion quadrant: Upper outer quadrant

At the conclusion of the procedure, a coil tissue marker clip was
deployed into the biopsy cavity. Follow-up 2-view mammogram was
performed and dictated separately.

2. Using sterile technique and 1% Lidocaine as local anesthetic,
under stereotactic guidance, a 9 gauge vacuum assisted device was
used to perform core needle biopsy of calcifications in the upper
outer left breast, anterior using a superior approach. Specimen
radiograph was performed showing at least 3 specimens with
calcifications. Specimens with calcifications are identified for
pathology.

Lesion quadrant: Upper outer quadrant

At the conclusion of the procedure, an X tissue marker clip was
deployed into the biopsy cavity. Follow-up 2-view mammogram was
performed and dictated separately.
IMPRESSION: Stereotactic-guided biopsy of calcifications in the upper outer left
breast at both the anterior and posterior aspect. No apparent
complications.

## 2020-03-10 IMAGING — MG MM BREAST LOCALIZATION CLIP
6 series · 6 of 18 positions shown · non-contrast
Comparison: Previous exam(s).

CLINICAL DATA: 44-year-old female presenting for biopsy of left
breast calcifications.

EXAM:
DIAGNOSTIC LEFT MAMMOGRAM POST STEREOTACTIC BIOPSY

[L ML synth-2D]
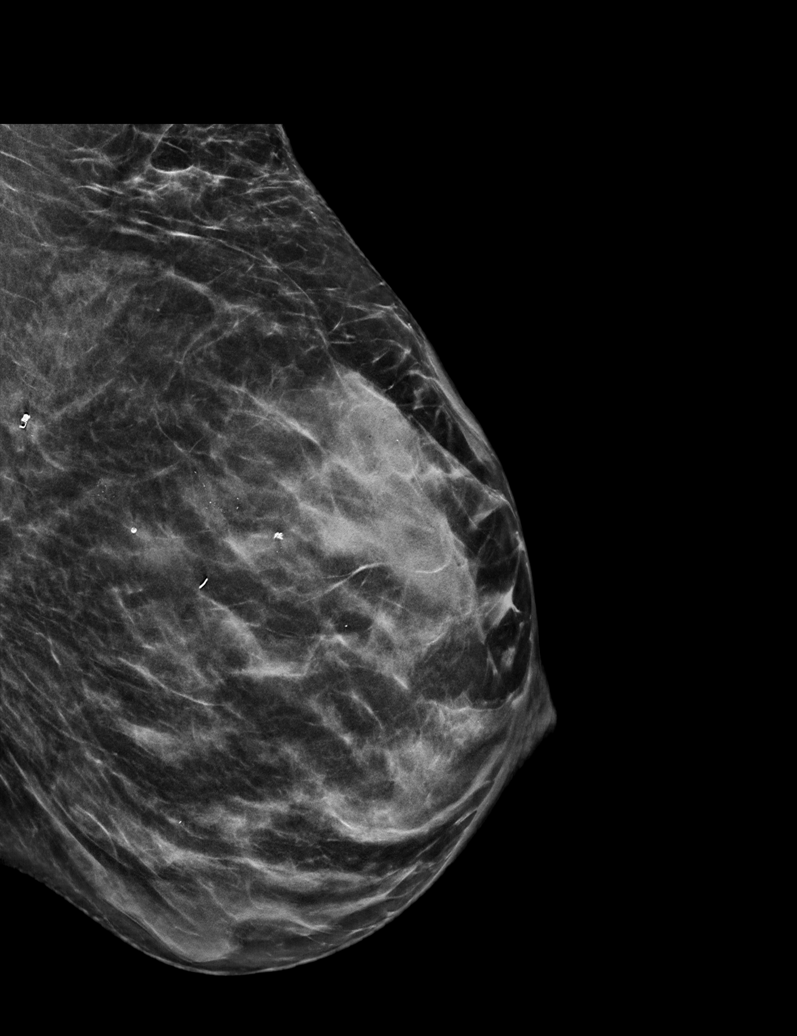

[L CC synth-2D (1 of 2)]
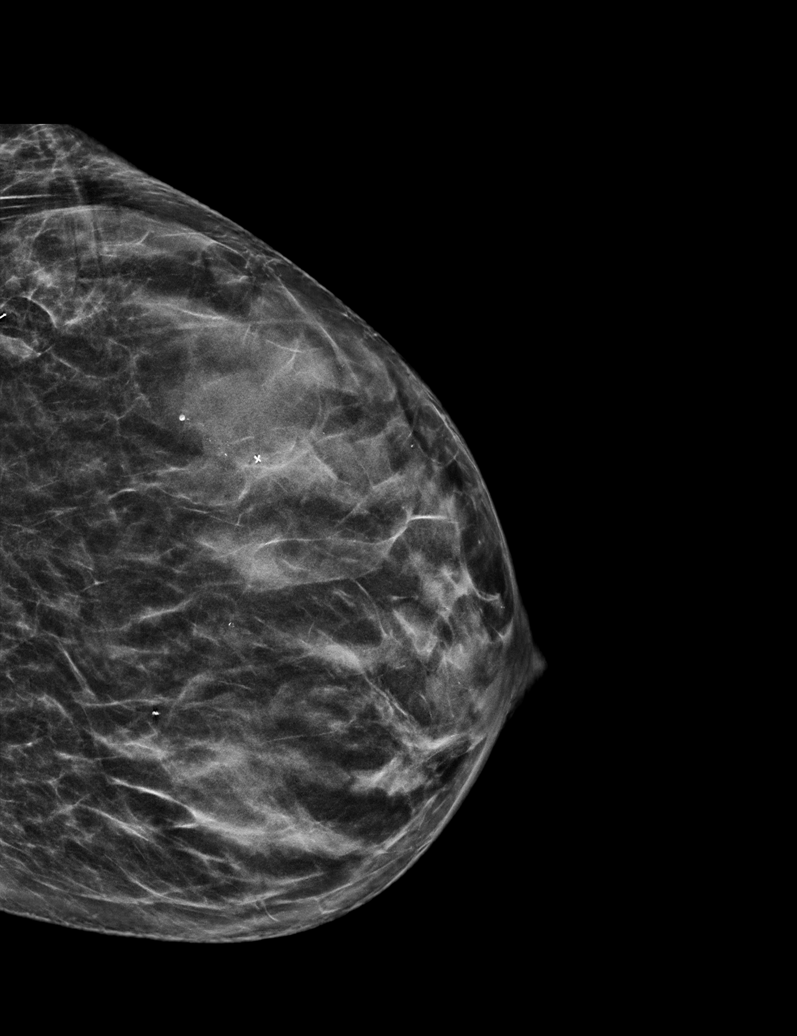

[L CC synth-2D (2 of 2)]
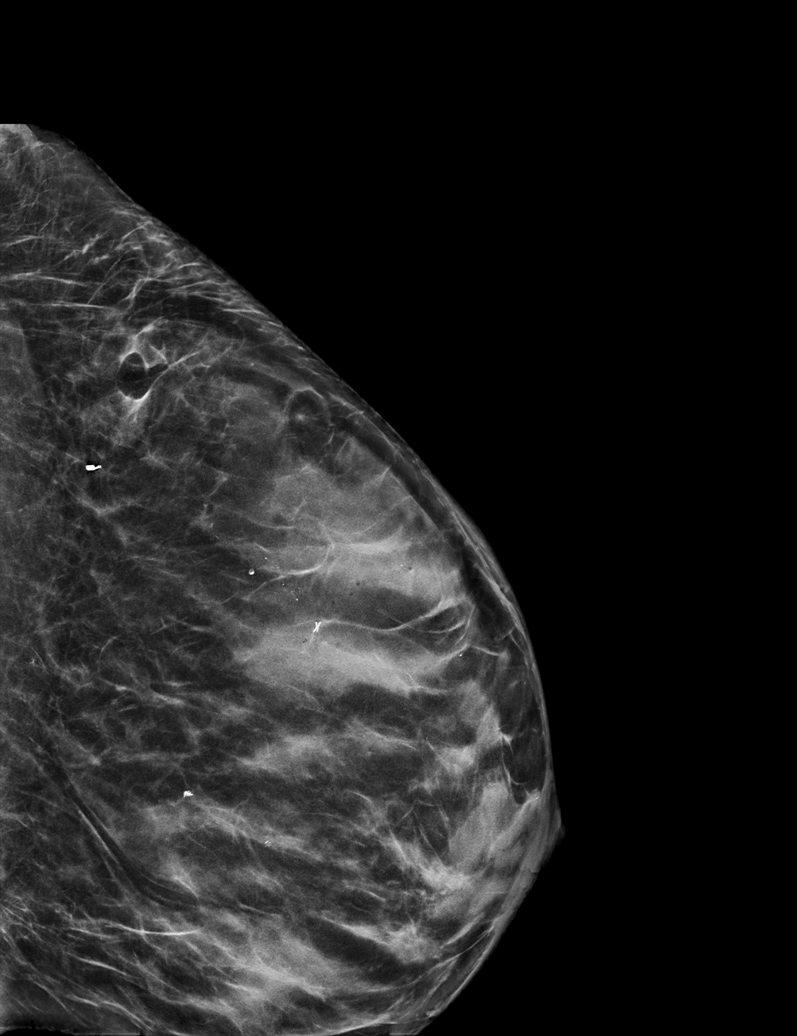

[L ML tomo · tomo slice 27/53.0]
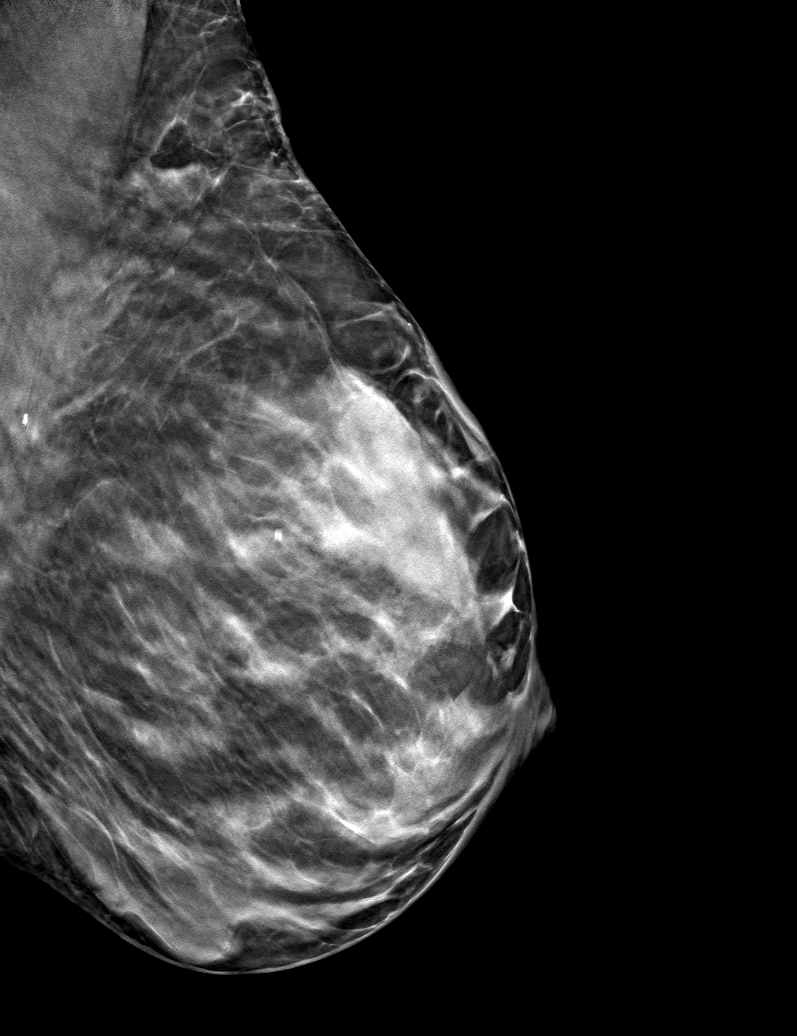

[L CC tomo (1 of 2) · tomo slice 29/56.0]
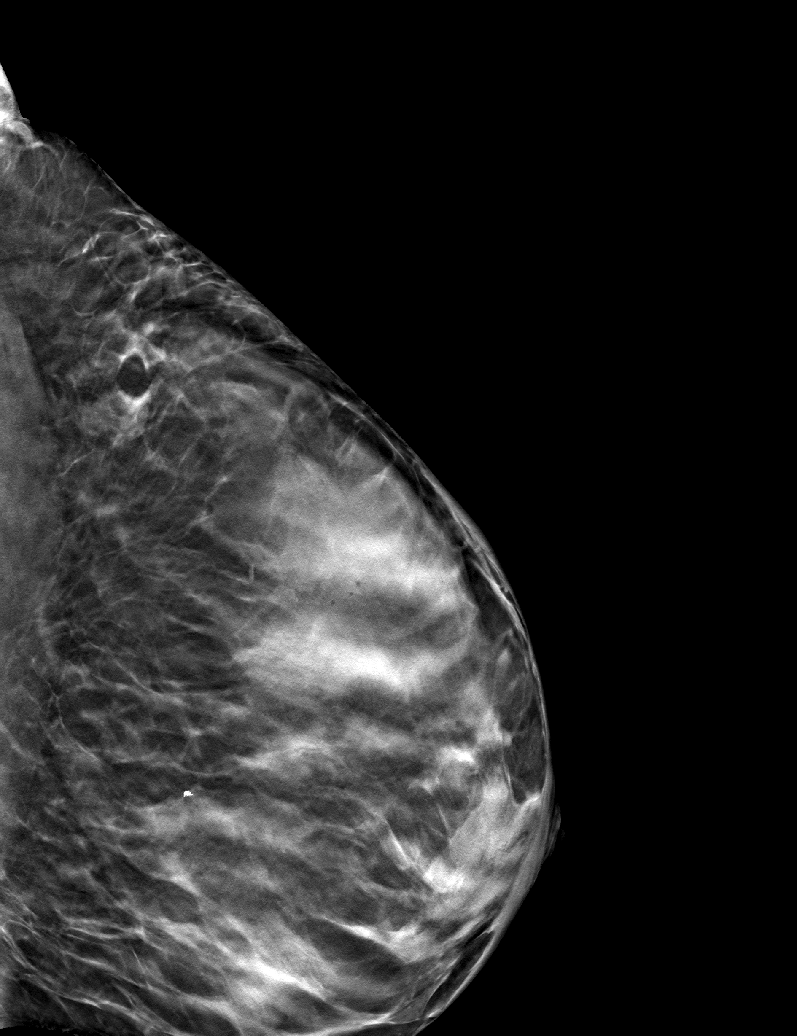

[L CC tomo (2 of 2) · tomo slice 27/54.0]
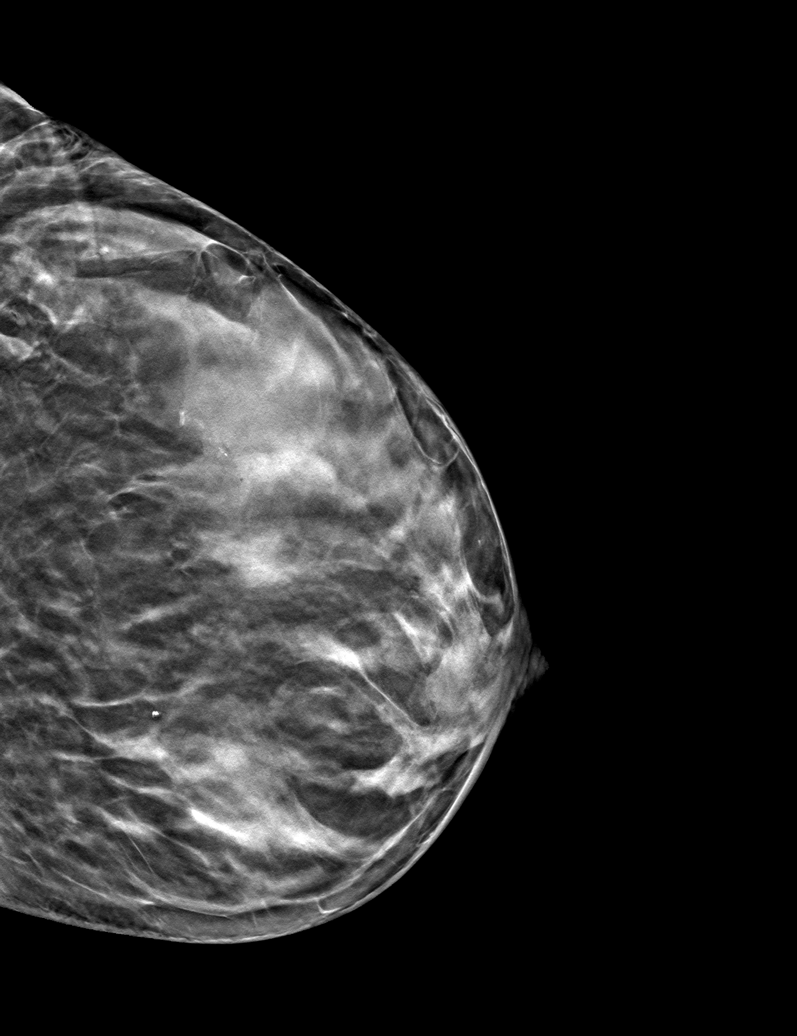

[6 of 18 positions shown; findings below may reference images not displayed]

FINDINGS: Mammographic images were obtained following stereotactic guided
biopsy x 2 of calcifications in the upper outer left breast. The
coil biopsy marking clip is in expected position at the site of
biopsy at the posterior aspect of the calcifications. The X biopsy
marking clip is located approximately 1.5 cm inferior to the
targeted anterior calcifications. There are residual calcifications
which can be localized for excision if necessary.
IMPRESSION: Appropriate positioning of the coil shaped biopsy marking clip at
the site of biopsy along the posterior aspect of the calcifications
in the upper-outer quadrant of the left breast. The X biopsy marking
clip is located approximately 1.5 cm inferior to the anterior aspect
of the calcifications in the upper-outer quadrant of the left
breast.

Final Assessment: Post Procedure Mammograms for Marker Placement

## 2020-03-23 ENCOUNTER — Other Ambulatory Visit: Payer: Self-pay | Admitting: Obstetrics and Gynecology

## 2020-03-23 DIAGNOSIS — C50919 Malignant neoplasm of unspecified site of unspecified female breast: Secondary | ICD-10-CM

## 2020-03-28 ENCOUNTER — Ambulatory Visit
Admission: RE | Admit: 2020-03-28 | Discharge: 2020-03-28 | Disposition: A | Discharge status: Home / Self Care | Payer: PRIVATE HEALTH INSURANCE | Source: Ambulatory Visit | Attending: Obstetrics and Gynecology | Admitting: Obstetrics and Gynecology

## 2020-03-28 ENCOUNTER — Other Ambulatory Visit: Payer: Self-pay

## 2020-03-28 DIAGNOSIS — C50919 Malignant neoplasm of unspecified site of unspecified female breast: Secondary | ICD-10-CM

## 2020-03-28 IMAGING — MR MR BREAST BILAT WO/W CM
7 of 13 series · 26 of 48 positions shown · IV contrast (10ml gadavist)
Comparison: Previous exam(s).
COMPARISON: Previous exam(s).

Addendum:
CLINICAL DATA: 44-year-old female with two sites of newly diagnosed
DCIS in the anterior and posterior left breast.

LABS:  None performed on site.
EXAM:
BILATERAL BREAST MRI WITH AND WITHOUT CONTRAST
TECHNIQUE: Multiplanar, multisequence MR images of both breasts were obtained
prior to and following the intravenous administration of 10 ml of
Gadavist.

[Series 2: t2_tirm_tra ipat (a-p) · axial · 3.0mm · 0.70mm/px · 1 of 55 slices shown]
[im 1/55]
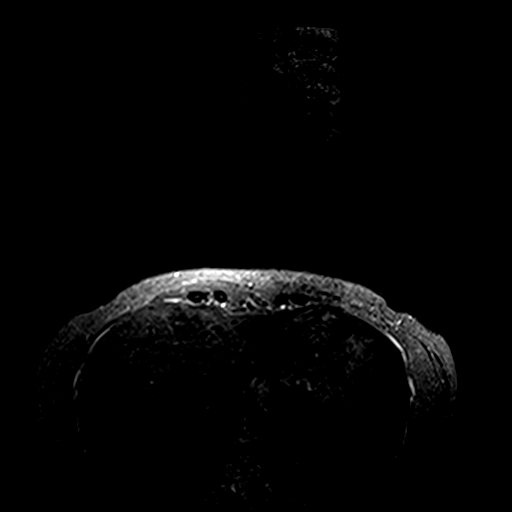

[Series 3: fl3d pre-cm no · axial · non-contrast · 1.2mm · 0.94mm/px · z∈[-75,+97]mm · 5 of 144 slices shown]
[im 1/144]
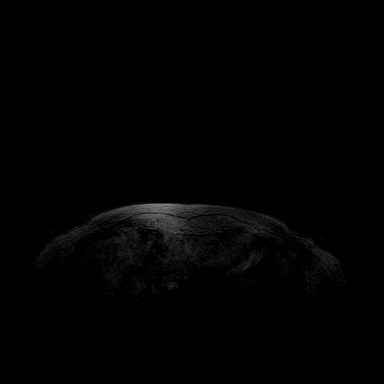
[im 36/144]
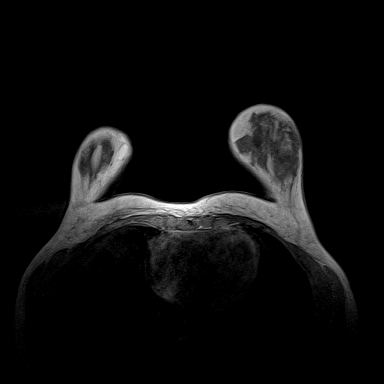
[im 72/144]
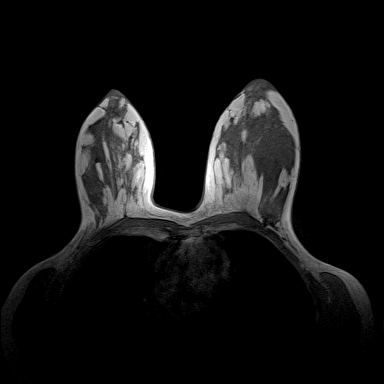
[im 108/144]
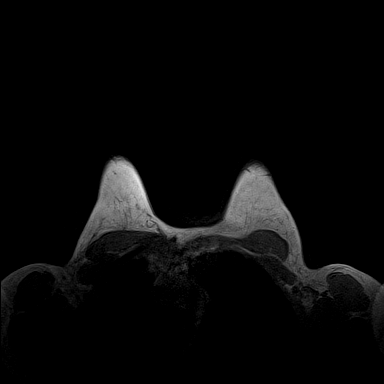
[im 144/144]
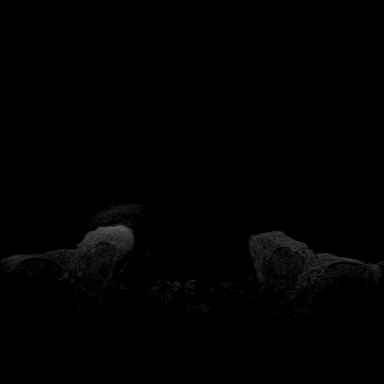

[Series 4: fl3d pre-cm · axial · non-contrast · 1.2mm · 0.94mm/px · z∈[-75,+97]mm · 5 of 144 slices shown]
[im 1/144]
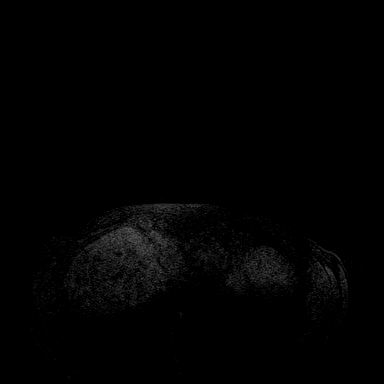
[im 36/144]
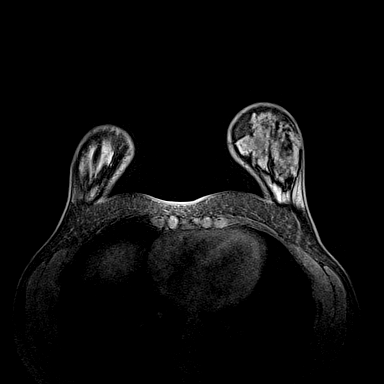
[im 72/144]
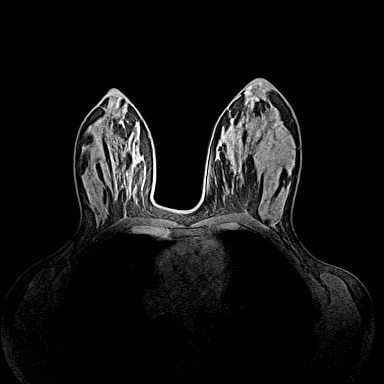
[im 108/144]
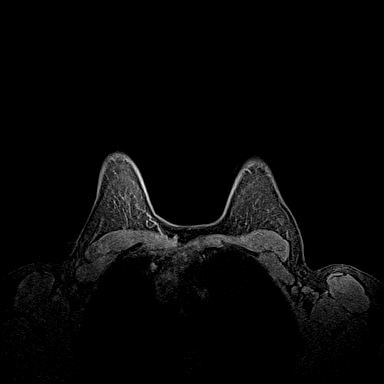
[im 144/144]
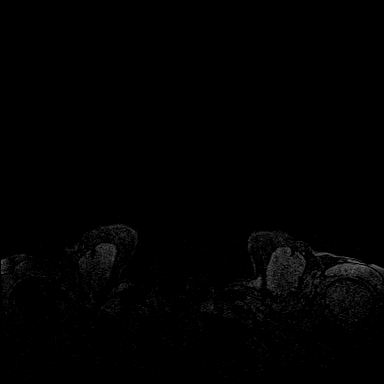

[Series 5: fl3d post-cm 20 · axial · 1.2mm · 0.94mm/px · z∈[-75,+97]mm · 5 of 144 slices shown (1 of 3)]
[im 1/144]
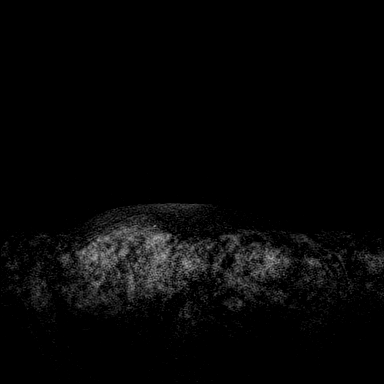
[im 36/144]
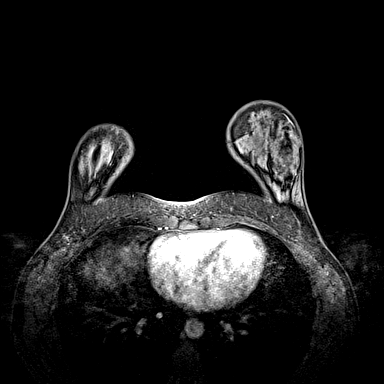
[im 72/144]
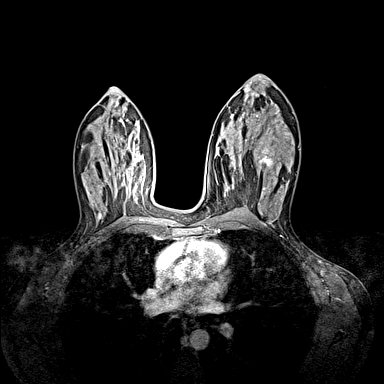
[im 108/144]
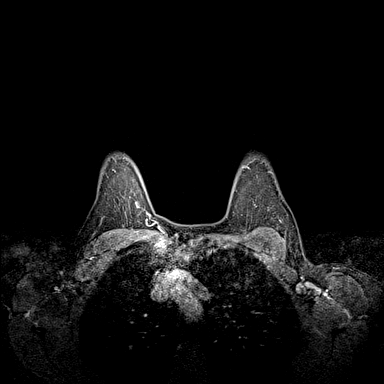
[im 144/144]
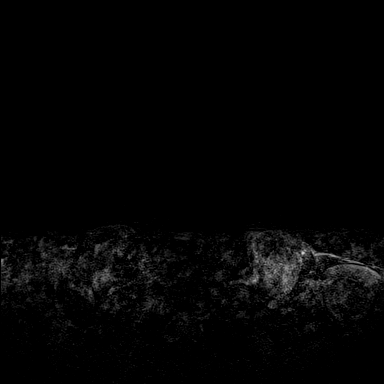

[Series 6: fl3d post-cm 20 · axial · 1.2mm · 0.94mm/px · z∈[-75,+97]mm · 5 of 144 slices shown (2 of 3)]
[im 1/144]
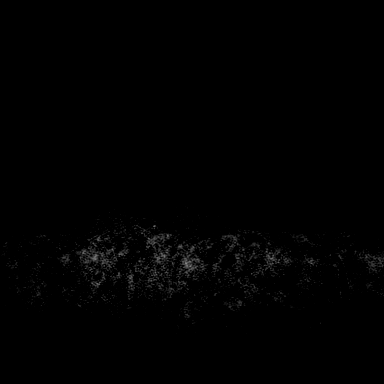
[im 36/144]
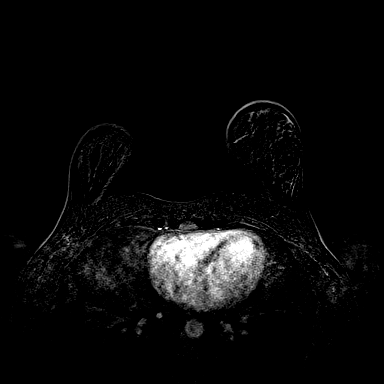
[im 72/144]
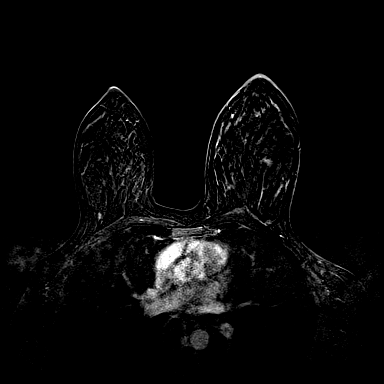
[im 108/144]
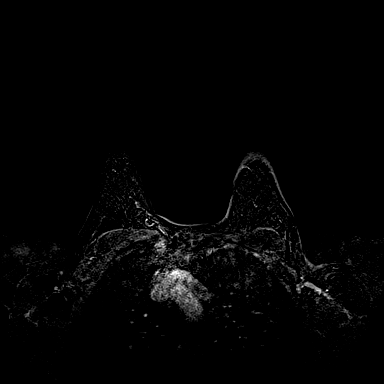
[im 144/144]
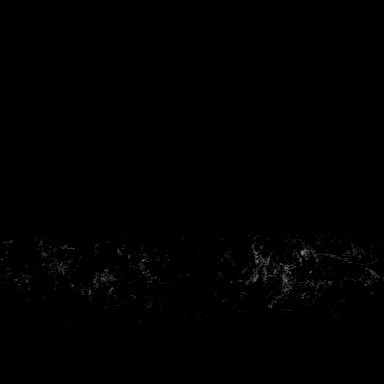

[Series 7: fl3d post-cm 20 · axial · 172.8mm · 0.94mm/px · 1 of 1 slices shown (3 of 3)]
[im 1/1]
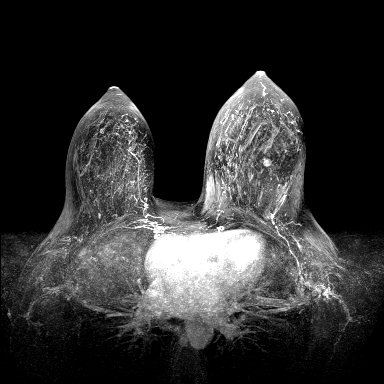

[Series 8: fl3d post-cm 3min · axial · 1.2mm · 0.94mm/px · z∈[-75,+53]mm · 4 of 144 slices shown]
[im 1/144]
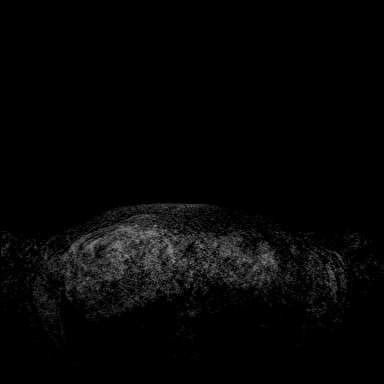
[im 36/144]
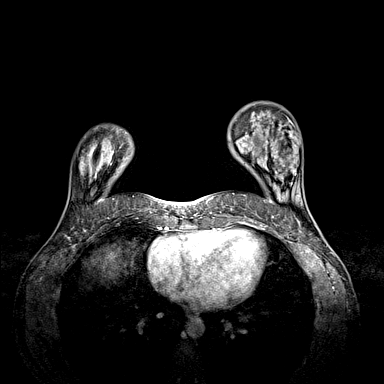
[im 72/144]
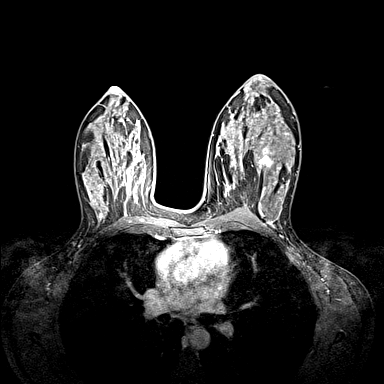
[im 108/144]
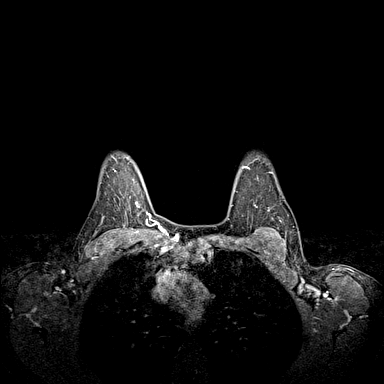

[26 of 48 positions shown; findings below may reference images not displayed]

Three-dimensional MR images were rendered by post-processing of the
original MR data on an independent workstation. The
three-dimensional MR images were interpreted, and findings are
reported in the following complete MRI report for this study. Three
dimensional images were evaluated at the independent [employer]
workstation
FINDINGS: Breast composition: d. Extreme fibroglandular tissue.

Background parenchymal enhancement: Moderate.

Right breast: No suspicious mass or abnormal enhancement.

Left breast: Susceptibility artifact from two post biopsy clips are
demonstrated in the upper outer left breast at middle depth (series
3, image 83/144) in the upper outer left breast at far posterior
depth (series 3, image 68/144). These are consistent with the
patient's two biopsy-proven sites of ductal carcinoma in situ in
association with calcifications. Areas of subtle non-mass
enhancement and punctate enhancing foci are seen scattered between
the two post biopsy clips and extending anteriorly and laterally.
Overall, this spans approximately 10 cm in the AP dimension is
cm in the transverse dimension.

Additionally, an irregular enhancing mass with suspicious washout
kinetics is seen between the two biopsy sites (series 6, image
75/144). It measures 9 x 7 x 10 mm. This is suspicious for an
additional area of invasive malignancy.

Lymph nodes: No abnormal appearing lymph nodes.

Ancillary findings:  None.
IMPRESSION: 1. Two sites of post-biopsy changes in the upper outer left breast
at middle and posterior depth consistent with biopsy proven DCIS.
Subtle non-mass enhancement and punctate enhancing foci extend
between the two post biopsy clips and anteriorly and laterally.
Overall, this spans approximately 10 x 3.4 cm (AP by transverse
dimension).
2. Suspicious 1 cm enhancing mass located between the two sites of
biopsy proven DCIS (series 6, image 75). This is highly suspicious
for a site of invasive disease.
3. No MRI evidence of malignancy on the right.
4. No suspicious lymphadenopathy.

RECOMMENDATION:
1. Recommendation is that the patient proceed directly to MRI guided
biopsy of the suspicious left breast mass due to suspicion for
invasive disease. Given the location and the patient's breast
density, this area is felt unlikely to be seen sonographically.
2. If breast conservation therapy is a consideration, consider
additional MRI guided biopsy along one of the anterior or lateral
sites of subtle non-mass enhancement as seen on today's study.

BI-RADS CATEGORY  4: Suspicious.

ADDENDUM:
The patient presented on [DATE] for MR guided biopsy of the
suspicious 1 cm enhancing mass within the LEFT breast.

She informed us that she had a positive home pregnancy test on the
evening of [DATE].

Potential fetal effects from gadolinium were discussed with the
patient and we both decided to defer the MR guided biopsy until
after she had further discussions with her husband and treating
physicians.

Given that this LEFT breast mass cannot be visualized on noncontrast
MRI and not identified sonographically, tissue sampling would need
to be performed with contrast MRI utilizing IV gadolinium if deemed
necessary.

*** End of Addendum ***
Three-dimensional MR images were rendered by post-processing of the
original MR data on an independent workstation. The
three-dimensional MR images were interpreted, and findings are
reported in the following complete MRI report for this study. Three
dimensional images were evaluated at the independent [employer]
workstation
FINDINGS: Breast composition: d. Extreme fibroglandular tissue.

Background parenchymal enhancement: Moderate.

Right breast: No suspicious mass or abnormal enhancement.

Left breast: Susceptibility artifact from two post biopsy clips are
demonstrated in the upper outer left breast at middle depth (series
3, image 83/144) in the upper outer left breast at far posterior
depth (series 3, image 68/144). These are consistent with the
patient's two biopsy-proven sites of ductal carcinoma in situ in
association with calcifications. Areas of subtle non-mass
enhancement and punctate enhancing foci are seen scattered between
the two post biopsy clips and extending anteriorly and laterally.
Overall, this spans approximately 10 cm in the AP dimension is
cm in the transverse dimension.

Additionally, an irregular enhancing mass with suspicious washout
kinetics is seen between the two biopsy sites (series 6, image
75/144). It measures 9 x 7 x 10 mm. This is suspicious for an
additional area of invasive malignancy.

Lymph nodes: No abnormal appearing lymph nodes.

Ancillary findings:  None.
IMPRESSION: 1. Two sites of post-biopsy changes in the upper outer left breast
at middle and posterior depth consistent with biopsy proven DCIS.
Subtle non-mass enhancement and punctate enhancing foci extend
between the two post biopsy clips and anteriorly and laterally.
Overall, this spans approximately 10 x 3.4 cm (AP by transverse
dimension).
2. Suspicious 1 cm enhancing mass located between the two sites of
biopsy proven DCIS (series 6, image 75). This is highly suspicious
for a site of invasive disease.
3. No MRI evidence of malignancy on the right.
4. No suspicious lymphadenopathy.

RECOMMENDATION:
1. Recommendation is that the patient proceed directly to MRI guided
biopsy of the suspicious left breast mass due to suspicion for
invasive disease. Given the location and the patient's breast
density, this area is felt unlikely to be seen sonographically.
2. If breast conservation therapy is a consideration, consider
additional MRI guided biopsy along one of the anterior or lateral
sites of subtle non-mass enhancement as seen on today's study.

BI-RADS CATEGORY  4: Suspicious.

## 2020-03-28 MED ORDER — GADOBUTROL 1 MMOL/ML IV SOLN
10.0000 mL | Freq: Once | INTRAVENOUS | Status: AC | PRN
  Administered 2020-03-28: 10 mL via INTRAVENOUS

## 2020-03-29 ENCOUNTER — Telehealth: Payer: Self-pay | Admitting: Hematology and Oncology

## 2020-03-29 NOTE — Telephone Encounter (Signed)
Appt is scheduled on 4/27 at 10am not 4/29

## 2020-03-29 NOTE — Telephone Encounter (Signed)
Received a new pt referral from Dr. Donne Hazel for dx of breast cancer. Pt has been cld and scheduled to see Dr. Lindi Adie on 4/29 at 10am. Pt aware to arrive 15 minutes early. 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

## 2020-03-31 ENCOUNTER — Other Ambulatory Visit: Payer: Self-pay | Admitting: Obstetrics and Gynecology

## 2020-03-31 DIAGNOSIS — R9389 Abnormal findings on diagnostic imaging of other specified body structures: Secondary | ICD-10-CM

## 2020-04-02 ENCOUNTER — Other Ambulatory Visit: Payer: Self-pay | Admitting: General Surgery

## 2020-04-02 DIAGNOSIS — R921 Mammographic calcification found on diagnostic imaging of breast: Secondary | ICD-10-CM

## 2020-04-12 ENCOUNTER — Other Ambulatory Visit: Payer: PRIVATE HEALTH INSURANCE

## 2020-04-12 NOTE — Progress Notes (Signed)
[facility] CONSULT NOTE  Patient Care Team: Reynold Bowen, MD as PCP - General (Endocrinology)  CHIEF COMPLAINTS/PURPOSE OF CONSULTATION:  Newly diagnosed left breast DCIS  HISTORY OF PRESENTING ILLNESS:  Tracy Guerra 44 y.o. female is here because of recent diagnosis of ductal carcinoma in situ of the left breast. Screening mammogram on 02/24/20 showed left breast calcifications. Diagnostic mammogram on 03/02/20 showed left breast calcifications, 5.8cm overall. 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 Breast MRI on 03/28/20 showed the two sites of biopsy proven disease in the upper outer left breast with subtle non-mass enhancement extending between them suspicious for invasive disease. She presents to the clinic today for initial evaluation and discussion of treatment options.   I reviewed her records extensively and collaborated the history with the patient.  SUMMARY OF ONCOLOGIC HISTORY: Oncology History  Ductal carcinoma in situ (DCIS) of left breast  03/02/2020 Initial Diagnosis   Screening mammogram revealed left breast calcifications 5.8 cm overall.  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   03/28/2020 Breast MRI   two sites of biopsy proven disease in the upper outer left breast with subtle non-mass enhancement extending between them suspicious for invasive disease.  Additional 1 cm nodule between the 2 biopsy sites.  Total span 10 cm     MEDICAL HISTORY:  Past Medical History:  Diagnosis Date  . Hypothyroidism     SURGICAL HISTORY: Past Surgical History:  Procedure Laterality Date  . NASAL SEPTUM SURGERY    . vaginal polyp removed      SOCIAL HISTORY: Denies any tobacco or alcohol or recreational drug use FAMILY HISTORY: Family History  Problem Relation Age of Onset  . Breast cancer Mother 66    ALLERGIES:  is allergic to avelox [moxifloxacin hcl in  nacl].  MEDICATIONS:  Current Outpatient Medications  Medication Sig Dispense Refill  . Cholecalciferol (VITAMIN D) 2000 UNITS CAPS Take 1 capsule by mouth daily.      . Docosahexaenoic Acid (DHA COMPLETE PO) Take 1 capsule by mouth daily.      . fluticasone (FLONASE) 50 MCG/ACT nasal spray Place into the nose.    . levothyroxine (SYNTHROID, LEVOTHROID) 25 MCG tablet Take 25-50 mcg by mouth daily. Take 25 mcg 5 times a week and 50 mcg twice a week    . Multiple Vitamin (MULTIVITAMIN) capsule Take by mouth.    . nystatin-triamcinolone (MYCOLOG II) cream APPLY TO THE AFFECTED AREA TWICE DAILY AS NEEDED FOR MORNING AND EVENING    . prenatal vitamin w/FE, FA (PRENATAL 1 + 1) 27-1 MG TABS Take 1 tablet by mouth daily.      No current facility-administered medications for this visit.    REVIEW OF SYSTEMS:   Constitutional: Denies fevers, chills or abnormal night sweats Eyes: Denies blurriness of vision, double vision or watery eyes Ears, nose, mouth, throat, and face: Denies mucositis or sore throat Respiratory: Denies cough, dyspnea or wheezes Cardiovascular: Denies palpitation, chest discomfort or lower extremity swelling Gastrointestinal:  Denies nausea, heartburn or change in bowel habits Skin: Denies abnormal skin rashes Lymphatics: Denies new lymphadenopathy or easy bruising Neurological:Denies numbness, tingling or new weaknesses Behavioral/Psych: Mood is stable, no new changes  Breast: Denies any palpable lumps or discharge All other systems were reviewed with the patient and are negative.  PHYSICAL EXAMINATION: ECOG PERFORMANCE STATUS: 1 - Symptomatic but completely ambulatory  Vitals:   04/13/20 1345  BP:  116/71  Pulse: 87  Resp: 18  Temp: 98.9 F (37.2 C)  SpO2: 97%   Filed Weights   04/13/20 1345  Weight: 141 lb 14.4 oz (64.4 kg)    GENERAL:alert, no distress and comfortable SKIN: skin color, texture, turgor are normal, no rashes or significant lesions EYES:  normal, conjunctiva are pink and non-injected, sclera clear OROPHARYNX:no exudate, no erythema and lips, buccal mucosa, and tongue normal  NECK: supple, thyroid normal size, non-tender, without nodularity LYMPH:  no palpable lymphadenopathy in the cervical, axillary or inguinal LUNGS: clear to auscultation and percussion with normal breathing effort HEART: regular rate & rhythm and no murmurs and no lower extremity edema ABDOMEN:abdomen soft, non-tender and normal bowel sounds Musculoskeletal:no cyanosis of digits and no clubbing  PSYCH: alert & oriented x 3 with fluent speech NEURO: no focal motor/sensory deficits   LABORATORY DATA:  I have reviewed the data as listed Lab Results  Component Value Date   WBC 6.4 09/07/2018   HGB 12.7 09/07/2018   HCT 38.2 09/07/2018   MCV 97.0 09/07/2018   PLT 245 09/07/2018   No results found for: NA, K, CL, CO2  RADIOGRAPHIC STUDIES: I have personally reviewed the radiological reports and agreed with the findings in the report.  ASSESSMENT AND PLAN:  Ductal carcinoma in situ (DCIS) of left breast Screening mammogram on 02/24/20 showed left breast calcifications 5.8cm overall. 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  Total span 10 cm.  Suspicious enhancing mass 1 cm size between the 2 biopsied areas  Pathology review: I discussed with the patient the difference between DCIS and invasive breast cancer. It is considered a precancerous lesion. DCIS is classified as a 0. It is generally detected through mammograms as calcifications. We discussed the significance of grades and its impact on prognosis. We also discussed the importance of ER and PR receptors  and their implications to adjuvant treatment options. Prognosis of DCIS dependence on grade, comedo necrosis. It is anticipated that if not treated, 20-30% of DCIS can develop into invasive breast cancer.  Recommendation: 1.  MRI guided biopsy of the 1 cm enhancing mass as well as the anterior or lateral sides of non-mass enhancement 2. Comet clinical trial (if the biopsies are DCIS and no invasive cancer) vs active surveillance 3. antiestrogen therapy with tamoxifen 5 years  Tamoxifen counseling: We discussed the risks and benefits of tamoxifen. These include but not limited to insomnia, hot flashes, mood changes, vaginal dryness, and weight gain. Although rare, serious side effects including endometrial cancer, risk of blood clots were also discussed. We strongly believe that the benefits far outweigh the risks. Patient understands these risks and consented to starting treatment. Planned treatment duration is 5 years.  If there is invasive cancer patient will proceed to surgery.  If the non-mass enhancement is also DCIS then she will need a mastectomy.  If she gets a mastectomy and then she will not need radiation.  However if she undergoes lumpectomy she will need radiation. I will email her a copy of Comet clinical trial.  We will reconvene after we get more biopsy results.  All questions were answered. The patient knows to call the clinic with any problems, questions or  concerns.   Rulon Eisenmenger, MD, MPH 04/13/2020    I, Molly Dorshimer, am acting as scribe for Nicholas Lose, MD.  I have reviewed the above documentation for accuracy and completeness, and I agree with the above.

## 2020-04-13 ENCOUNTER — Ambulatory Visit
Admission: RE | Admit: 2020-04-13 | Discharge: 2020-04-13 | Disposition: A | Discharge status: Home / Self Care | Payer: PRIVATE HEALTH INSURANCE | Source: Ambulatory Visit | Attending: General Surgery | Admitting: General Surgery

## 2020-04-13 ENCOUNTER — Other Ambulatory Visit: Payer: PRIVATE HEALTH INSURANCE

## 2020-04-13 ENCOUNTER — Inpatient Hospital Stay: Payer: PRIVATE HEALTH INSURANCE | Attending: Hematology and Oncology | Admitting: Hematology and Oncology

## 2020-04-13 ENCOUNTER — Other Ambulatory Visit: Payer: Self-pay

## 2020-04-13 ENCOUNTER — Encounter: Payer: Self-pay | Admitting: *Deleted

## 2020-04-13 DIAGNOSIS — C50412 Malignant neoplasm of upper-outer quadrant of left female breast: Secondary | ICD-10-CM | POA: Insufficient documentation

## 2020-04-13 DIAGNOSIS — Z803 Family history of malignant neoplasm of breast: Secondary | ICD-10-CM

## 2020-04-13 DIAGNOSIS — E039 Hypothyroidism, unspecified: Secondary | ICD-10-CM | POA: Diagnosis not present

## 2020-04-13 DIAGNOSIS — R921 Mammographic calcification found on diagnostic imaging of breast: Secondary | ICD-10-CM

## 2020-04-13 DIAGNOSIS — Z170 Estrogen receptor positive status [ER+]: Secondary | ICD-10-CM | POA: Diagnosis not present

## 2020-04-13 DIAGNOSIS — D0512 Intraductal carcinoma in situ of left breast: Secondary | ICD-10-CM | POA: Insufficient documentation

## 2020-04-13 IMAGING — US US BREAST*L* LIMITED INC AXILLA
1 series · 3 of 3 positions shown · non-contrast
Comparison: Previous exam(s).

CLINICAL DATA: 44-year-old female with newly diagnosed left breast
DCIS. Patient presents for second-look ultrasound of a left breast
mass seen on recent MRI.

EXAM:
ULTRASOUND OF THE LEFT BREAST

[Series 1: us breast*left* limited inc axilla · 0.08mm/px · 3 of 3 slices shown]
[im 1/3]
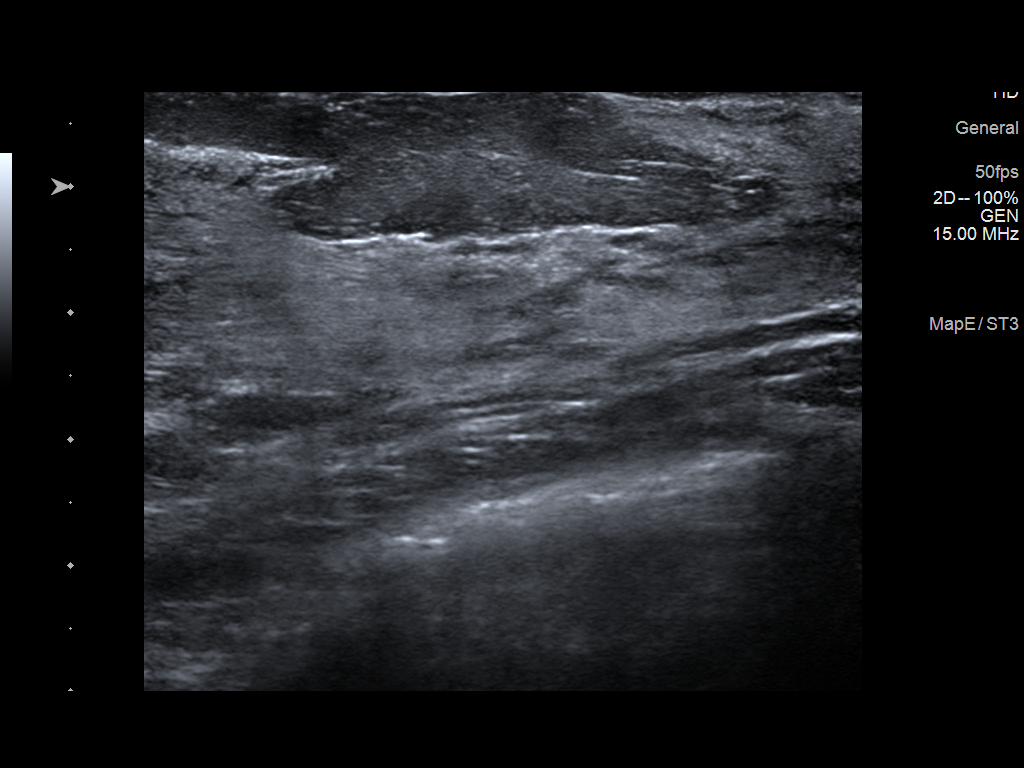
[im 2/3]
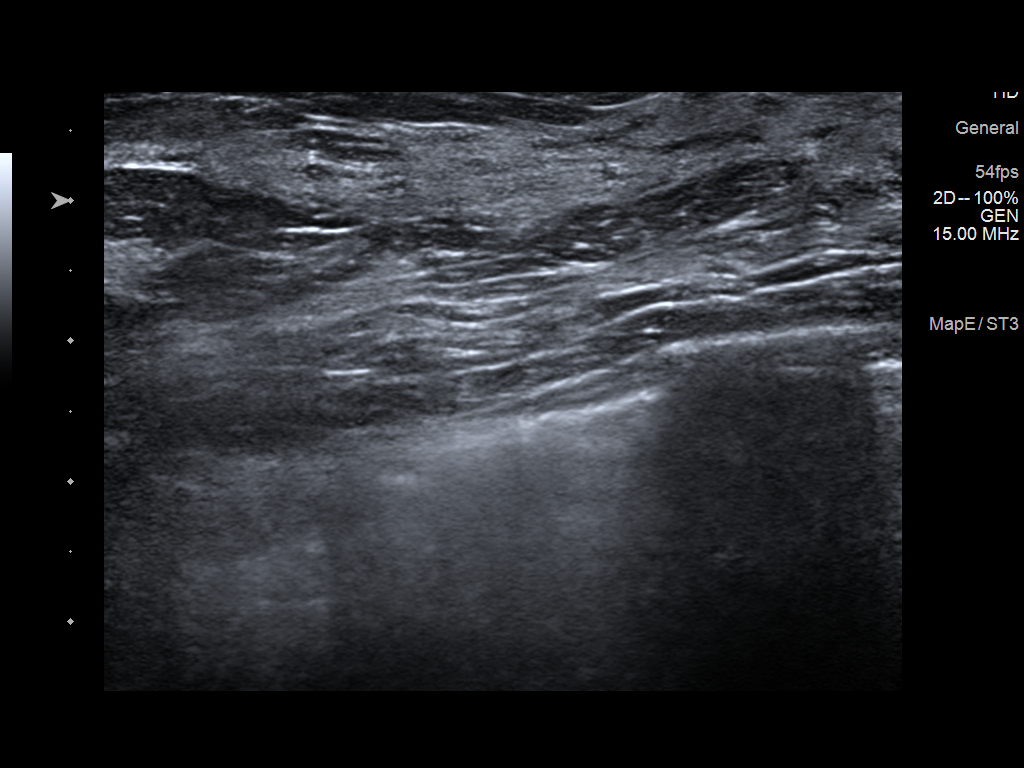
[im 3/3]
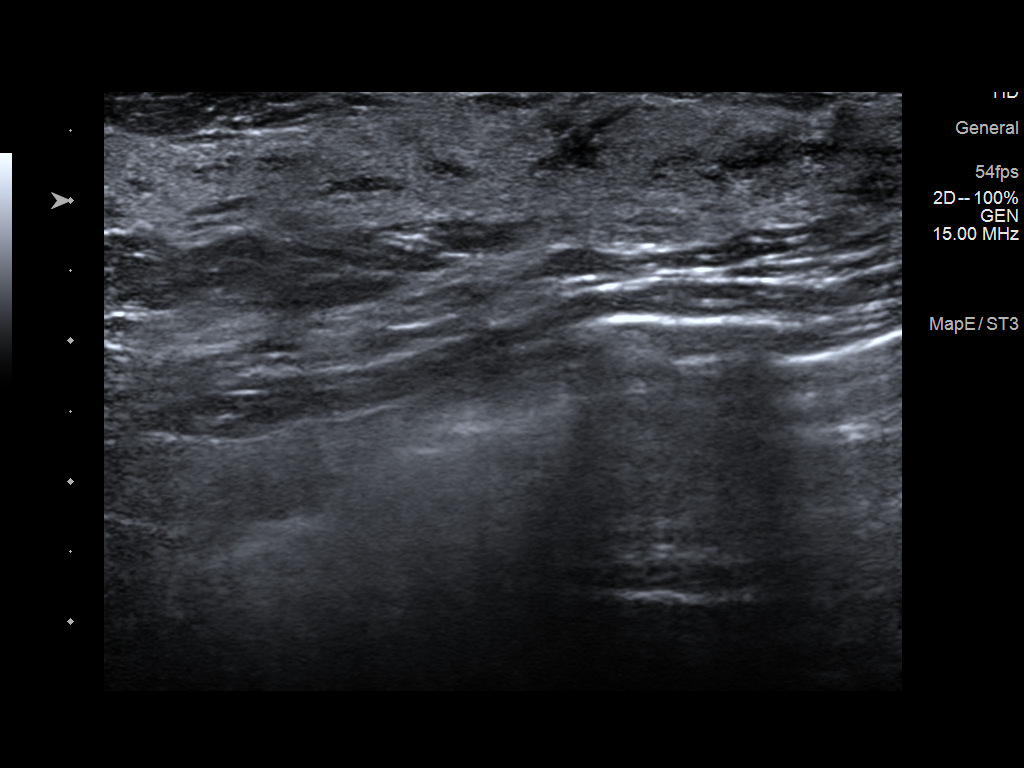

[3 of 3 positions shown; findings below may reference images not displayed]

FINDINGS: Targeted ultrasound is performed throughout the outer aspect of the
left breast demonstrating no discrete cystic or solid mass or other
finding to correspond to the abnormality identified on MRI.
IMPRESSION: No sonographic correlate to the mass seen on recent MRI in the left
breast.

RECOMMENDATION:
MRI guided core needle biopsy of the left breast.

I have discussed the findings and recommendations with the patient.
If applicable, a reminder letter will be sent to the patient
regarding the next appointment.

BI-RADS CATEGORY  1: Negative.

## 2020-04-13 MED ORDER — SELENIUM 100 MCG PO CAPS: 1.0000 | ORAL_CAPSULE | Freq: Every day | ORAL | 0 refills | Status: DC

## 2020-04-13 MED ORDER — LEVOTHYROXINE SODIUM 25 MCG PO TABS: 25.0000 ug | ORAL_TABLET | Freq: Every day | ORAL | Status: AC

## 2020-04-13 NOTE — Research (Signed)
AFT-25 COMET STUDY: COMPARING an OPERATION to MONITORING with or without ENDOCRINE THERAPY for LOW RISK DCIS  04/13/2020 1600 PM  CONSENT: Received message that patient was seen earlier today and requests a copy of the COMET consent emailed to her for review. Copy of AFT-25 COMET Informed Consent form (version 5, dated 04/28/19) forwarded to the email address listed on the demographic sheet along with my direct contact information. Requested patient review the attached consent and to call/email with any questions. I did list specific page numbers for important information, such as the purpose of the study, randomization of study groups, blood and tissue samples, possible risks of participation and costs. Advised that I will be reviewing eligibility criteria and will be reaching out to her in a few days to answer any questions. Requested patient kindly respond to the message so I know she received the information. Plan is to review her medical record to determine eligibility, update her physician and contact the patient for discussion on potential voluntarily participation. Patient was thanked for her time and willingness to review the provided information.   Dionne Bucy. Sharlett Iles, BSN, RN, CIC 04/13/2020 4:46 PM

## 2020-04-13 NOTE — Assessment & Plan Note (Signed)
Screening mammogram on 02/24/20 showed left breast calcifications 5.8cm overall. 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   Breast MRI on 03/28/20 showed the two sites of biopsy proven disease in the upper outer left breast with subtle non-mass enhancement extending between them suspicious for invasive disease.  Total span 10 cm.  Suspicious enhancing mass 1 cm size between the 2 biopsied areas  Pathology review: I discussed with the patient the difference between DCIS and invasive breast cancer. It is considered a precancerous lesion. DCIS is classified as a 0. It is generally detected through mammograms as calcifications. We discussed the significance of grades and its impact on prognosis. We also discussed the importance of ER and PR receptors and their implications to adjuvant treatment options. Prognosis of DCIS dependence on grade, comedo necrosis. It is anticipated that if not treated, 20-30% of DCIS can develop into invasive breast cancer.  Recommendation: 1.  MRI guided biopsy of the 1 cm enhancing mass as well as the anterior or lateral sides of non-mass enhancement 2. [facility] clinical trial 3. antiestrogen therapy with tamoxifen 5 years  Tamoxifen counseling: We discussed the risks and benefits of tamoxifen. These include but not limited to insomnia, hot flashes, mood changes, vaginal dryness, and weight gain. Although rare, serious side effects including endometrial cancer, risk of blood clots were also discussed. We strongly believe that the benefits far outweigh the risks. Patient understands these risks and consented to starting treatment. Planned treatment duration is 5 years.

## 2020-04-20 ENCOUNTER — Other Ambulatory Visit: Payer: Self-pay

## 2020-04-20 ENCOUNTER — Ambulatory Visit: Payer: PRIVATE HEALTH INSURANCE

## 2020-04-20 ENCOUNTER — Ambulatory Visit
Admission: RE | Admit: 2020-04-20 | Discharge: 2020-04-20 | Disposition: A | Discharge status: Home / Self Care | Payer: PRIVATE HEALTH INSURANCE | Source: Ambulatory Visit | Attending: Obstetrics and Gynecology | Admitting: Obstetrics and Gynecology

## 2020-04-20 DIAGNOSIS — R9389 Abnormal findings on diagnostic imaging of other specified body structures: Secondary | ICD-10-CM

## 2020-04-21 ENCOUNTER — Encounter: Payer: Self-pay | Admitting: *Deleted

## 2020-04-23 ENCOUNTER — Telehealth: Payer: Self-pay | Admitting: Hematology and Oncology

## 2020-04-23 NOTE — Telephone Encounter (Signed)
I called and left a message for her to call me back on my mobile number. With the recent pregnancy test we cannot perform any further MRIs because of fetal toxicities.  Without the MRIs we cannot do MRI guided biopsies.  Without the biopsies we do not know if there is invasive cancer or whether it is all DCIS. This is a difficult situation and we will discuss with Dr. Donne Hazel to come up with the plan of action.

## 2020-04-27 ENCOUNTER — Encounter: Payer: Self-pay | Admitting: *Deleted

## 2020-04-27 NOTE — Progress Notes (Signed)
Spoke with patient today about her recent +preg test.  She states she is seeing Dr. Helane Rima today and they will doing some blood work.  Patient will check in a week. She will hold off until she knows if preg is viable.   I will check back with her in a week. Will also check with radiology to see if MR bx could be done in 2nd trimester if patient pregnancy continues. Patient verbalized understanding.

## 2020-05-05 ENCOUNTER — Telehealth: Payer: Self-pay | Admitting: *Deleted

## 2020-05-05 NOTE — Telephone Encounter (Signed)
Due to patient's work schedul, sent email to for patient update on her pregnancy and how she is doing.

## 2020-05-11 ENCOUNTER — Encounter: Payer: Self-pay | Admitting: *Deleted

## 2020-05-18 ENCOUNTER — Telehealth: Payer: Self-pay

## 2020-05-18 DIAGNOSIS — D0512 Intraductal carcinoma in situ of left breast: Secondary | ICD-10-CM

## 2020-05-19 ENCOUNTER — Ambulatory Visit
Admission: RE | Admit: 2020-05-19 | Discharge: 2020-05-19 | Disposition: A | Discharge status: Home / Self Care | Payer: PRIVATE HEALTH INSURANCE | Source: Ambulatory Visit | Attending: Obstetrics and Gynecology | Admitting: Obstetrics and Gynecology

## 2020-05-19 ENCOUNTER — Other Ambulatory Visit (HOSPITAL_COMMUNITY): Payer: Self-pay | Admitting: Diagnostic Radiology

## 2020-05-19 ENCOUNTER — Other Ambulatory Visit: Payer: Self-pay

## 2020-05-19 ENCOUNTER — Ambulatory Visit: Admission: RE | Admit: 2020-05-19 | Payer: PRIVATE HEALTH INSURANCE | Source: Ambulatory Visit

## 2020-05-19 DIAGNOSIS — R9389 Abnormal findings on diagnostic imaging of other specified body structures: Secondary | ICD-10-CM

## 2020-05-19 IMAGING — MR MR BREAST BX W LOC DEV EA ADD LESION IMAGE BX SPEC MR GUIDE*L*
10 of 14 series · 30 of 48 positions shown · IV contrast (6ml Gadavist)
Comparison: Previous exams.
COMPARISON: Previous exams.

Addendum:
CLINICAL DATA: 44-year-old female for tissue sampling of 1 cm
UPPER-OUTER LEFT breast mass and non masslike enhancement within the
anterior LOWER OUTER LEFT breast. Recent diagnosis of LEFT breast
DCIS.

EXAM:
MRI GUIDED CORE NEEDLE BIOPSY OF THE LEFT BREAST X 2
TECHNIQUE: Multiplanar, multisequence MR imaging of the LEFT breast was
performed both before and after administration of intravenous
contrast.
CONTRAST:  6mL GADAVIST GADOBUTROL 1 MMOL/ML IV SOLN

[Series 2: fiducial unilateral · sagittal · 2.0mm · 1.33mm/px · 1 of 52 slices shown]
[im 1/52]
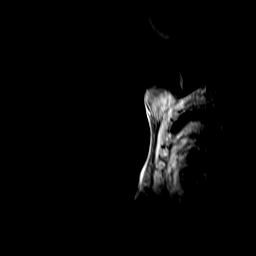

[Series 3: dynamic pre · axial · non-contrast · 1.3mm · 0.73mm/px · z∈[-68,+118]mm · 3 of 144 slices shown]
[im 1/144]
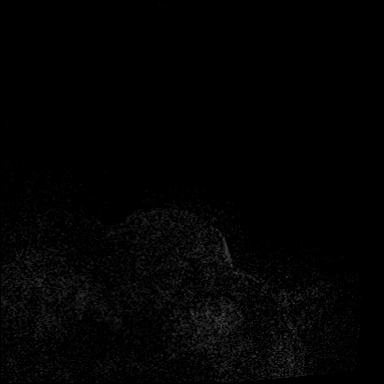
[im 72/144]
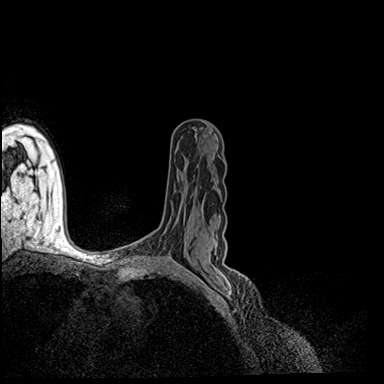
[im 144/144]
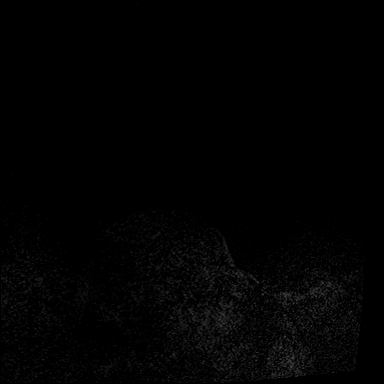

[Series 4: dynamic post 20 · axial · 1.3mm · 0.73mm/px · z∈[-68,+118]mm · 3 of 144 slices shown (1 of 2)]
[im 1/144]
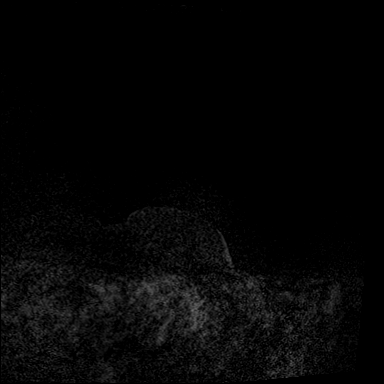
[im 72/144]
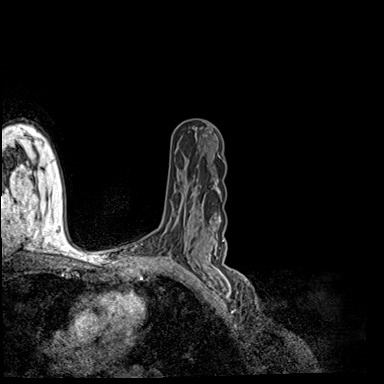
[im 144/144]
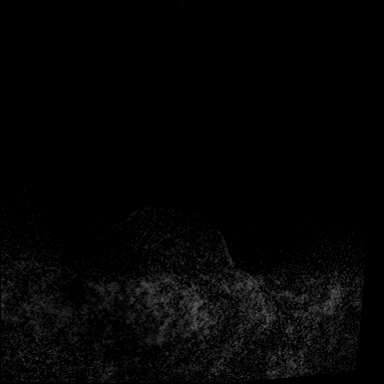

[Series 5: dynamic post 20 · axial · 1.3mm · 0.73mm/px · z∈[-68,+118]mm · 3 of 144 slices shown (2 of 2)]
[im 1/144]
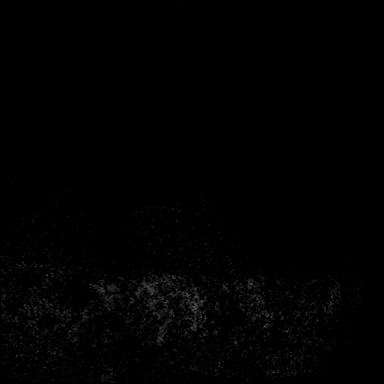
[im 72/144]
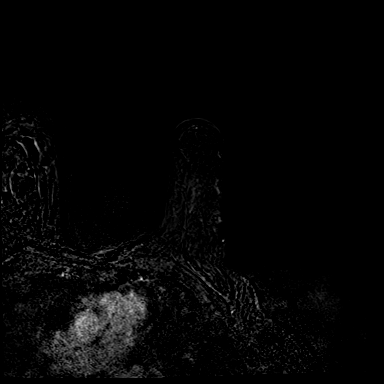
[im 144/144]
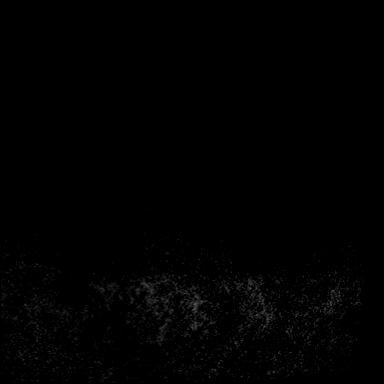

[Series 6: dynamic post 3 · axial · 1.3mm · 0.73mm/px · z∈[-68,+118]mm · 3 of 144 slices shown (1 of 2)]
[im 1/144]
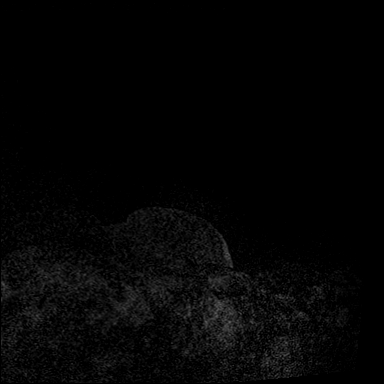
[im 72/144]
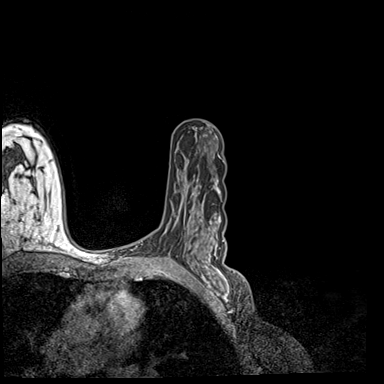
[im 144/144]
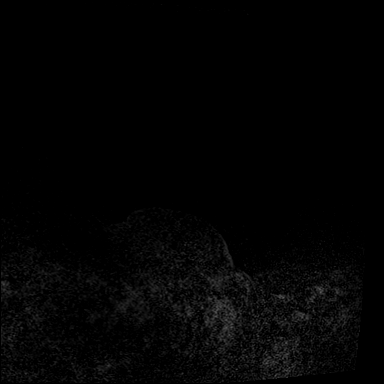

[Series 7: dynamic post 3 · axial · 1.3mm · 0.73mm/px · z∈[-68,+118]mm · 3 of 144 slices shown (2 of 2)]
[im 1/144]
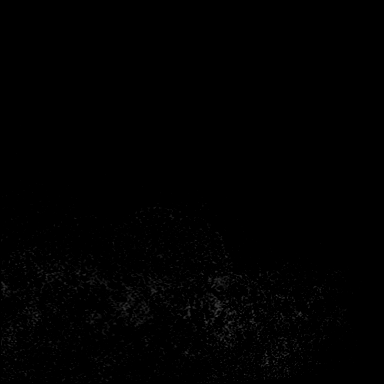
[im 72/144]
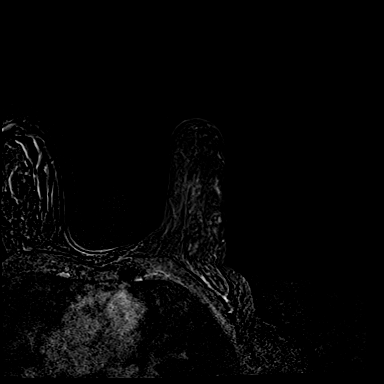
[im 144/144]
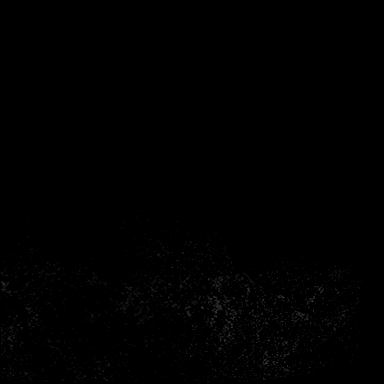

[Series 8: dynamic post 5 · axial · 1.3mm · 0.73mm/px · z∈[-68,+118]mm · 4 of 144 slices shown (1 of 2)]
[im 1/144]
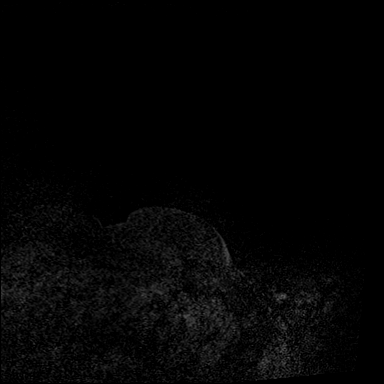
[im 48/144]
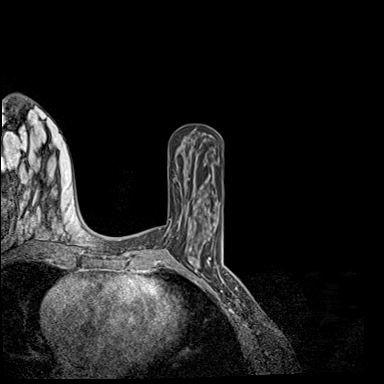
[im 96/144]
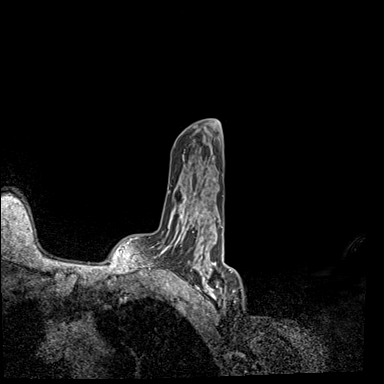
[im 144/144]
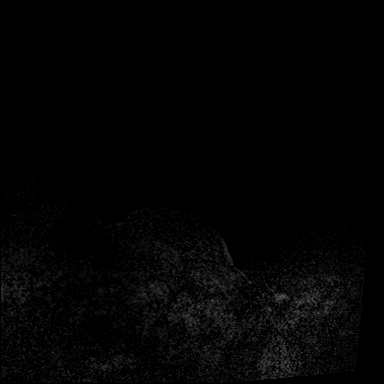

[Series 9: dynamic post 5 · axial · 1.3mm · 0.73mm/px · z∈[-68,+118]mm · 4 of 144 slices shown (2 of 2)]
[im 1/144]
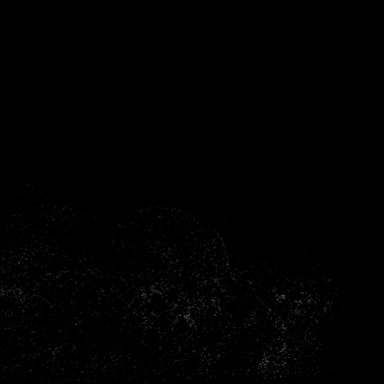
[im 48/144]
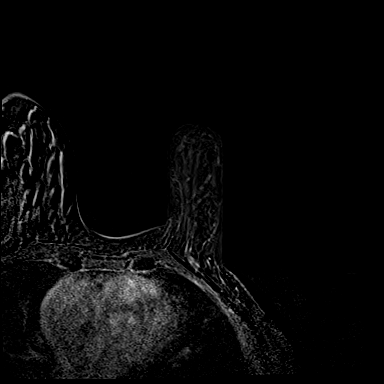
[im 96/144]
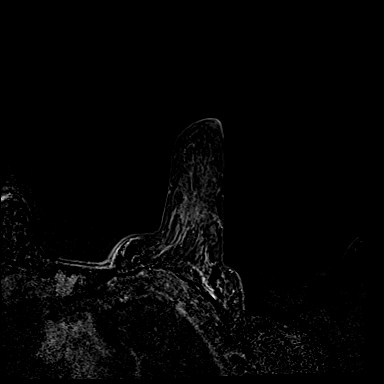
[im 144/144]
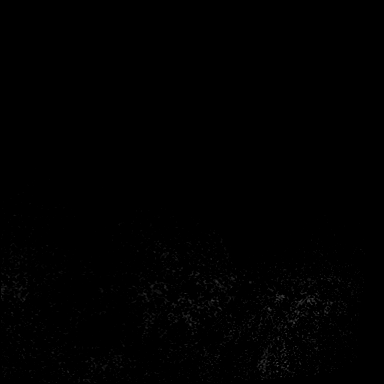

[Series 10: needle confirmation · axial · 1.3mm · 0.73mm/px · z∈[-68,+118]mm · 4 of 144 slices shown]
[im 1/144]
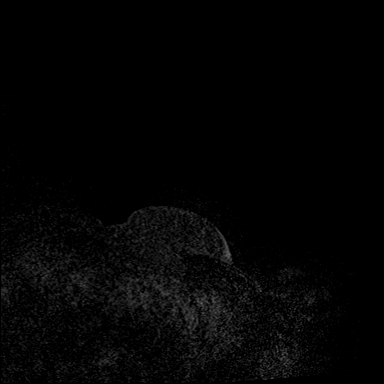
[im 48/144]
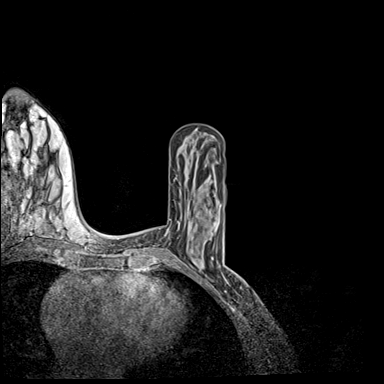
[im 96/144]
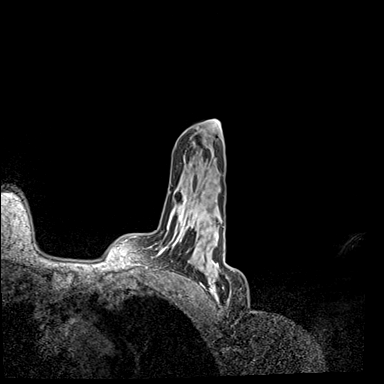
[im 144/144]
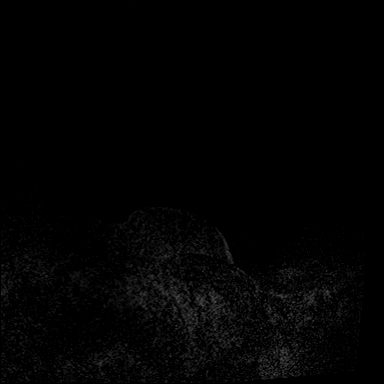

[Series 11: needle confirmation_sub · axial · 1.3mm · 0.73mm/px · z∈[-68,-7]mm · 2 of 144 slices shown]
[im 1/144]
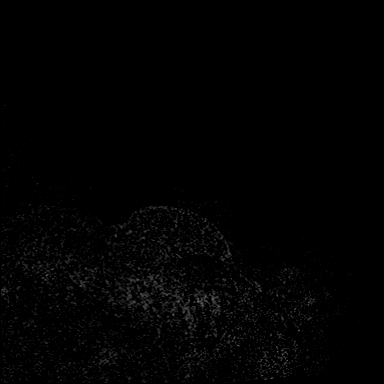
[im 48/144]
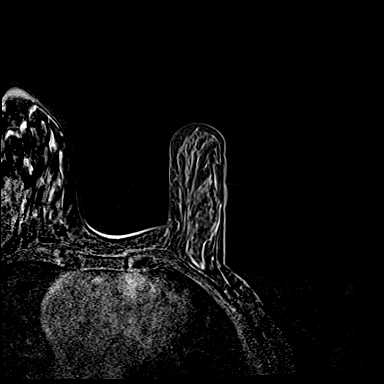

[30 of 48 positions shown; findings below may reference images not displayed]

FINDINGS: I met with the patient, and we discussed the procedure of MRI guided
biopsy, including risks, benefits, and alternatives. Specifically,
we discussed the risks of infection, bleeding, tissue injury, clip
migration, and inadequate sampling. Informed, written consent was
given. The usual time out protocol was performed immediately prior
to the procedure.

MR GUIDED CORE NEEDLE BIOPSY OF THE LEFT BREAST #1 (1 cm UPPER-OUTER
LEFT breast mass-CYLINDER clip):

Using sterile technique, 1% Lidocaine, MRI guidance, and a 9 gauge
vacuum assisted device, biopsy was performed of the 1 cm mass within
the UPPER-OUTER LEFT breast using a LATERAL approach. At the
conclusion of the procedure, a CYLINDER tissue marker clip was
deployed into the biopsy cavity. Follow-up 2-view mammogram was
performed and dictated separately.

MR GUIDED CORE NEEDLE BIOPSY OF THE LEFT BREAST #2 (subtle non
masslike enhancement within the LOWER OUTER LEFT breast-BARBELL
clip):

Using sterile technique, 1% Lidocaine, MRI guidance, and a 9 gauge
vacuum assisted device, biopsy was performed of the subtle non
masslike enhancement within the LOWER OUTER LEFT breast using a
LATERAL approach. At the conclusion of the procedure, a BARBELL
tissue marker clip was deployed into the biopsy cavity. Follow-up
2-view mammogram was performed and dictated separately.
IMPRESSION: MRI guided biopsies of 1 cm UPPER-OUTER LEFT breast mass (CYLINDER
clip) and non masslike enhancement within the LOWER OUTER LEFT
breast (BARBELL clip). No apparent complications.

ADDENDUM:
Pathology revealed GRADE III INVASIVE DUCTAL CARCINOMA, DUCTAL
CARCINOMA IN SITU WITH NECROSIS AND CALCIFICATIONS of the LEFT
breast, upper outer. This was found to be concordant by Dr. DEYNE.

Pathology revealed HIGH GRADE DUCTAL CARCINOMA IN SITU WITH
CALCIFICATIONS AND NECROSIS of the LEFT breast, lower outer. This
was found to be concordant by Dr. DEYNE.

Pathology results were discussed with the patient by telephone by
Dr. DEYNE on [DATE]. The patient reported doing well after
the biopsies. Post biopsy instructions and care were reviewed and
questions were answered. The patient was encouraged to call The

The patient has a recent diagnosis of LEFT breast cancer and should
follow her outlined treatment plan.

Dr. DEYNE was notified of biopsy results via [REDACTED]
message on [DATE].

Pathology results reported by DEYNE, RN on [DATE].

*** End of Addendum ***
FINDINGS: I met with the patient, and we discussed the procedure of MRI guided
biopsy, including risks, benefits, and alternatives. Specifically,
we discussed the risks of infection, bleeding, tissue injury, clip
migration, and inadequate sampling. Informed, written consent was
given. The usual time out protocol was performed immediately prior
to the procedure.

MR GUIDED CORE NEEDLE BIOPSY OF THE LEFT BREAST #1 (1 cm UPPER-OUTER
LEFT breast mass-CYLINDER clip):

Using sterile technique, 1% Lidocaine, MRI guidance, and a 9 gauge
vacuum assisted device, biopsy was performed of the 1 cm mass within
the UPPER-OUTER LEFT breast using a LATERAL approach. At the
conclusion of the procedure, a CYLINDER tissue marker clip was
deployed into the biopsy cavity. Follow-up 2-view mammogram was
performed and dictated separately.

MR GUIDED CORE NEEDLE BIOPSY OF THE LEFT BREAST #2 (subtle non
masslike enhancement within the LOWER OUTER LEFT breast-BARBELL
clip):

Using sterile technique, 1% Lidocaine, MRI guidance, and a 9 gauge
vacuum assisted device, biopsy was performed of the subtle non
masslike enhancement within the LOWER OUTER LEFT breast using a
LATERAL approach. At the conclusion of the procedure, a BARBELL
tissue marker clip was deployed into the biopsy cavity. Follow-up
2-view mammogram was performed and dictated separately.
IMPRESSION: MRI guided biopsies of 1 cm UPPER-OUTER LEFT breast mass (CYLINDER
clip) and non masslike enhancement within the LOWER OUTER LEFT
breast (BARBELL clip). No apparent complications.

## 2020-05-19 IMAGING — MR MR BREAST BX W LOC DEV 1ST LESION IMAGE BX SPEC MR GUIDE*L*
10 of 14 series · 30 of 48 positions shown · IV contrast (6ml Gadavist)
Comparison: Previous exams.
COMPARISON: Previous exams.

Addendum:
CLINICAL DATA: 44-year-old female for tissue sampling of 1 cm
UPPER-OUTER LEFT breast mass and non masslike enhancement within the
anterior LOWER OUTER LEFT breast. Recent diagnosis of LEFT breast
DCIS.

EXAM:
MRI GUIDED CORE NEEDLE BIOPSY OF THE LEFT BREAST X 2
TECHNIQUE: Multiplanar, multisequence MR imaging of the LEFT breast was
performed both before and after administration of intravenous
contrast.
CONTRAST:  6mL GADAVIST GADOBUTROL 1 MMOL/ML IV SOLN

[Series 2: fiducial unilateral · sagittal · 2.0mm · 1.33mm/px · 1 of 52 slices shown]
[im 1/52]
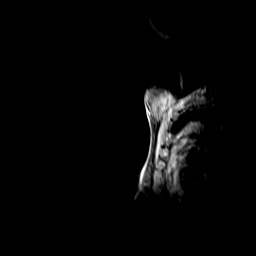

[Series 3: dynamic pre · axial · non-contrast · 1.3mm · 0.73mm/px · z∈[-68,+118]mm · 3 of 144 slices shown]
[im 1/144]
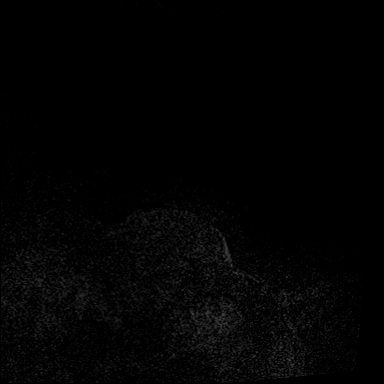
[im 72/144]
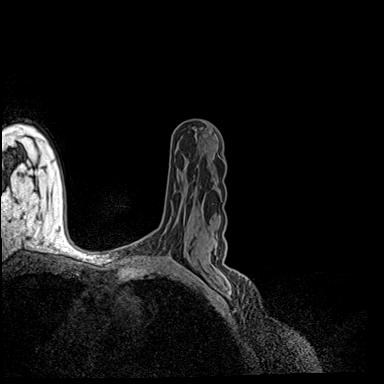
[im 144/144]
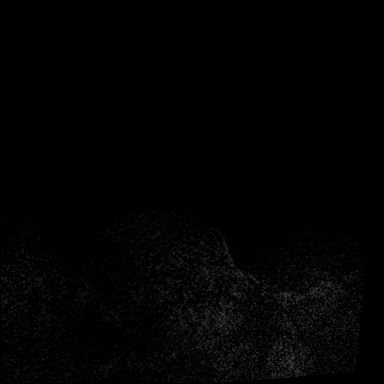

[Series 4: dynamic post 20 · axial · 1.3mm · 0.73mm/px · z∈[-68,+118]mm · 3 of 144 slices shown (1 of 2)]
[im 1/144]
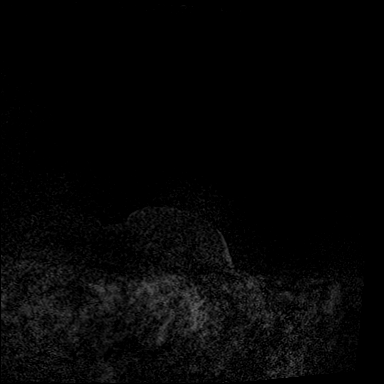
[im 72/144]
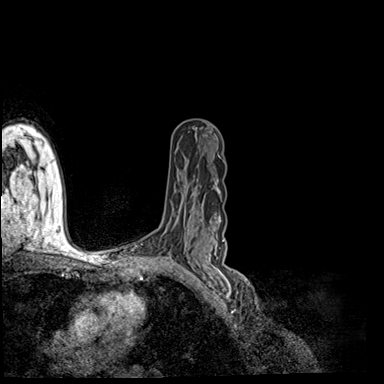
[im 144/144]
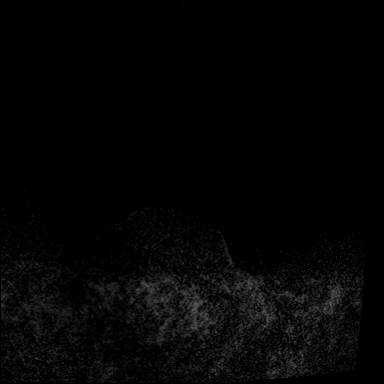

[Series 5: dynamic post 20 · axial · 1.3mm · 0.73mm/px · z∈[-68,+118]mm · 3 of 144 slices shown (2 of 2)]
[im 1/144]
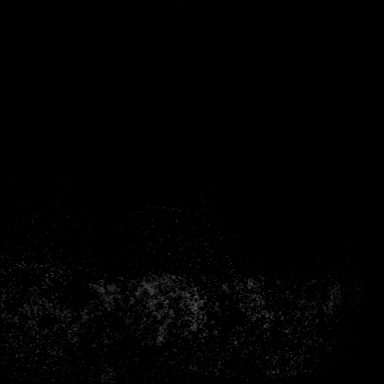
[im 72/144]
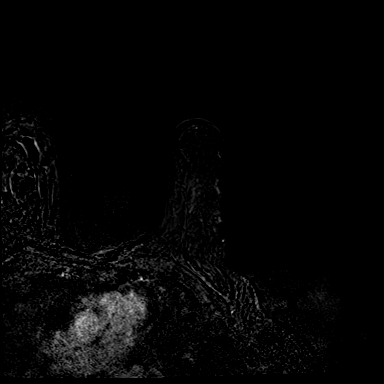
[im 144/144]
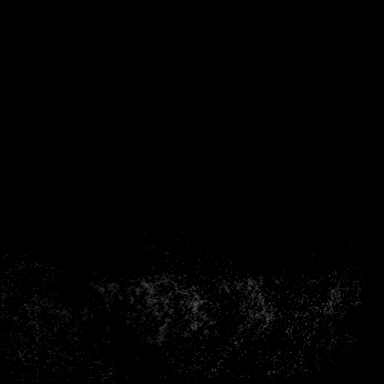

[Series 6: dynamic post 3 · axial · 1.3mm · 0.73mm/px · z∈[-68,+118]mm · 3 of 144 slices shown (1 of 2)]
[im 1/144]
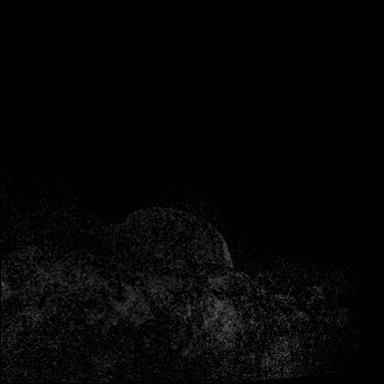
[im 72/144]
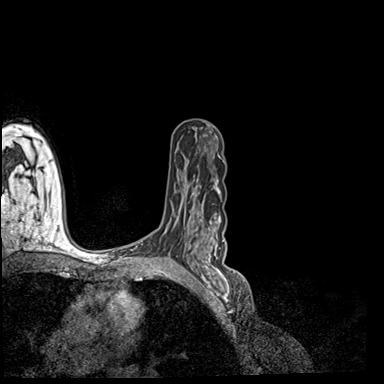
[im 144/144]
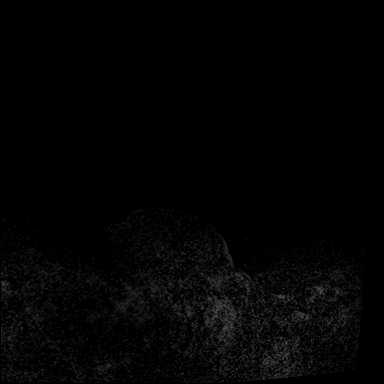

[Series 7: dynamic post 3 · axial · 1.3mm · 0.73mm/px · z∈[-68,+118]mm · 3 of 144 slices shown (2 of 2)]
[im 1/144]
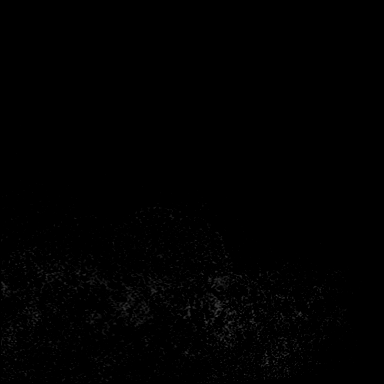
[im 72/144]
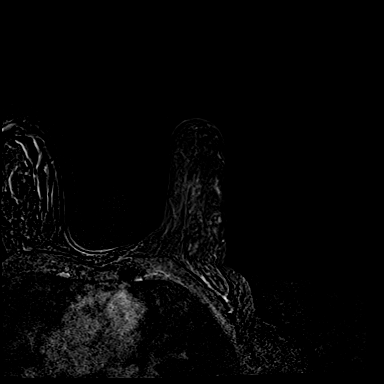
[im 144/144]
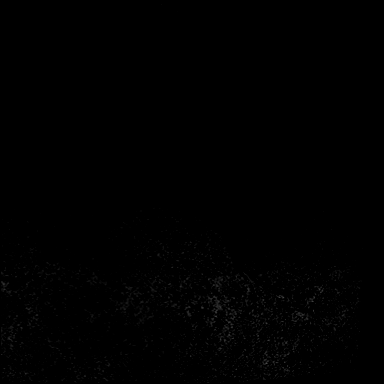

[Series 8: dynamic post 5 · axial · 1.3mm · 0.73mm/px · z∈[-68,+118]mm · 4 of 144 slices shown (1 of 2)]
[im 1/144]
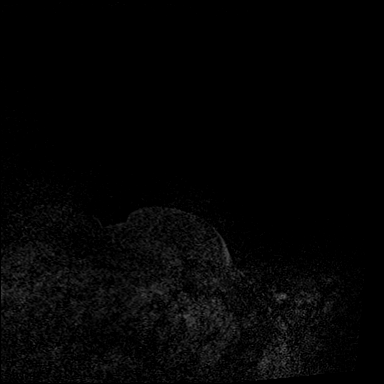
[im 48/144]
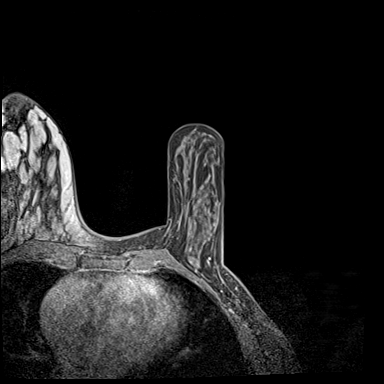
[im 96/144]
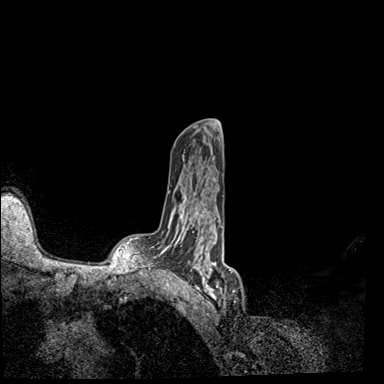
[im 144/144]
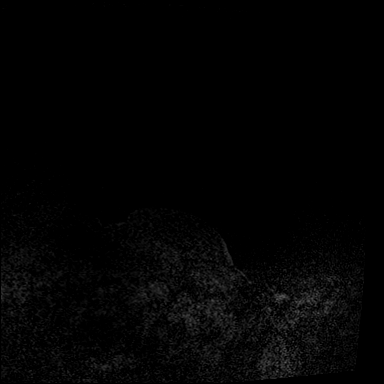

[Series 9: dynamic post 5 · axial · 1.3mm · 0.73mm/px · z∈[-68,+118]mm · 4 of 144 slices shown (2 of 2)]
[im 1/144]
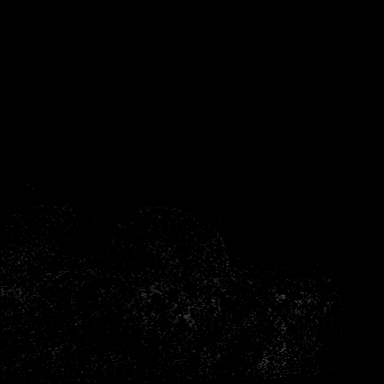
[im 48/144]
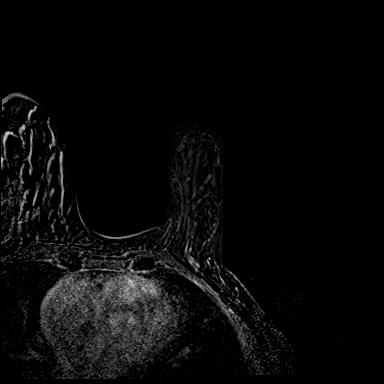
[im 96/144]
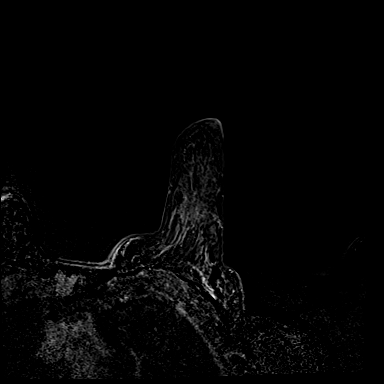
[im 144/144]
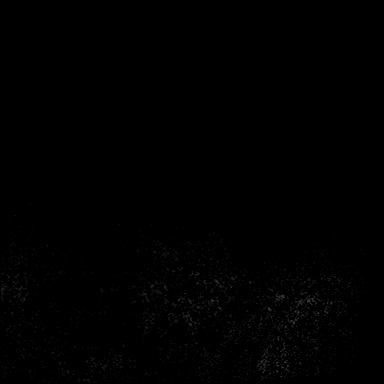

[Series 10: needle confirmation · axial · 1.3mm · 0.73mm/px · z∈[-68,+118]mm · 4 of 144 slices shown]
[im 1/144]
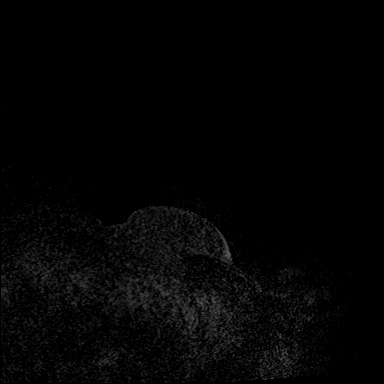
[im 48/144]
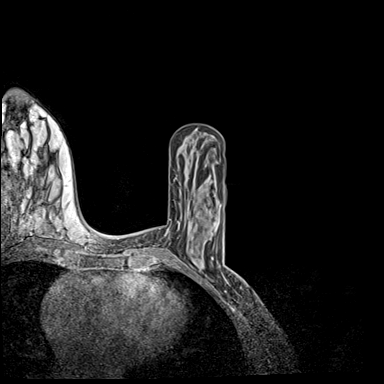
[im 96/144]
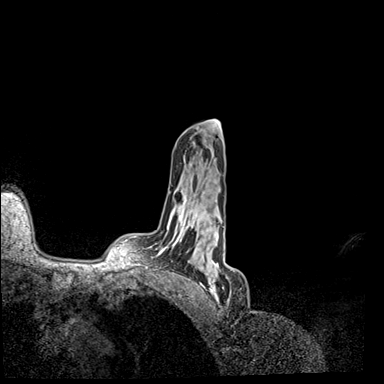
[im 144/144]
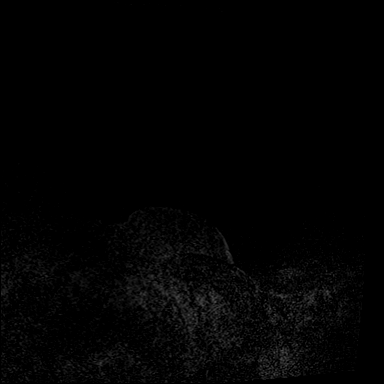

[Series 11: needle confirmation_sub · axial · 1.3mm · 0.73mm/px · z∈[-68,-7]mm · 2 of 144 slices shown]
[im 1/144]
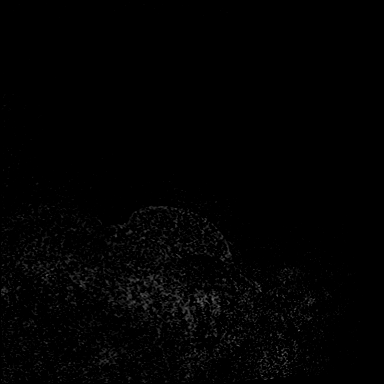
[im 48/144]
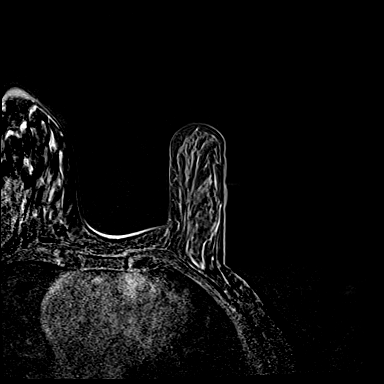

[30 of 48 positions shown; findings below may reference images not displayed]

FINDINGS: I met with the patient, and we discussed the procedure of MRI guided
biopsy, including risks, benefits, and alternatives. Specifically,
we discussed the risks of infection, bleeding, tissue injury, clip
migration, and inadequate sampling. Informed, written consent was
given. The usual time out protocol was performed immediately prior
to the procedure.

MR GUIDED CORE NEEDLE BIOPSY OF THE LEFT BREAST #1 (1 cm UPPER-OUTER
LEFT breast mass-CYLINDER clip):

Using sterile technique, 1% Lidocaine, MRI guidance, and a 9 gauge
vacuum assisted device, biopsy was performed of the 1 cm mass within
the UPPER-OUTER LEFT breast using a LATERAL approach. At the
conclusion of the procedure, a CYLINDER tissue marker clip was
deployed into the biopsy cavity. Follow-up 2-view mammogram was
performed and dictated separately.

MR GUIDED CORE NEEDLE BIOPSY OF THE LEFT BREAST #2 (subtle non
masslike enhancement within the LOWER OUTER LEFT breast-BARBELL
clip):

Using sterile technique, 1% Lidocaine, MRI guidance, and a 9 gauge
vacuum assisted device, biopsy was performed of the subtle non
masslike enhancement within the LOWER OUTER LEFT breast using a
LATERAL approach. At the conclusion of the procedure, a BARBELL
tissue marker clip was deployed into the biopsy cavity. Follow-up
2-view mammogram was performed and dictated separately.
IMPRESSION: MRI guided biopsies of 1 cm UPPER-OUTER LEFT breast mass (CYLINDER
clip) and non masslike enhancement within the LOWER OUTER LEFT
breast (BARBELL clip). No apparent complications.

ADDENDUM:
Pathology revealed GRADE III INVASIVE DUCTAL CARCINOMA, DUCTAL
CARCINOMA IN SITU WITH NECROSIS AND CALCIFICATIONS of the LEFT
breast, upper outer. This was found to be concordant by Dr. DEYNE.

Pathology revealed HIGH GRADE DUCTAL CARCINOMA IN SITU WITH
CALCIFICATIONS AND NECROSIS of the LEFT breast, lower outer. This
was found to be concordant by Dr. DEYNE.

Pathology results were discussed with the patient by telephone by
Dr. DEYNE on [DATE]. The patient reported doing well after
the biopsies. Post biopsy instructions and care were reviewed and
questions were answered. The patient was encouraged to call The

The patient has a recent diagnosis of LEFT breast cancer and should
follow her outlined treatment plan.

Dr. DEYNE was notified of biopsy results via [REDACTED]
message on [DATE].

Pathology results reported by DEYNE, RN on [DATE].

*** End of Addendum ***
FINDINGS: I met with the patient, and we discussed the procedure of MRI guided
biopsy, including risks, benefits, and alternatives. Specifically,
we discussed the risks of infection, bleeding, tissue injury, clip
migration, and inadequate sampling. Informed, written consent was
given. The usual time out protocol was performed immediately prior
to the procedure.

MR GUIDED CORE NEEDLE BIOPSY OF THE LEFT BREAST #1 (1 cm UPPER-OUTER
LEFT breast mass-CYLINDER clip):

Using sterile technique, 1% Lidocaine, MRI guidance, and a 9 gauge
vacuum assisted device, biopsy was performed of the 1 cm mass within
the UPPER-OUTER LEFT breast using a LATERAL approach. At the
conclusion of the procedure, a CYLINDER tissue marker clip was
deployed into the biopsy cavity. Follow-up 2-view mammogram was
performed and dictated separately.

MR GUIDED CORE NEEDLE BIOPSY OF THE LEFT BREAST #2 (subtle non
masslike enhancement within the LOWER OUTER LEFT breast-BARBELL
clip):

Using sterile technique, 1% Lidocaine, MRI guidance, and a 9 gauge
vacuum assisted device, biopsy was performed of the subtle non
masslike enhancement within the LOWER OUTER LEFT breast using a
LATERAL approach. At the conclusion of the procedure, a BARBELL
tissue marker clip was deployed into the biopsy cavity. Follow-up
2-view mammogram was performed and dictated separately.
IMPRESSION: MRI guided biopsies of 1 cm UPPER-OUTER LEFT breast mass (CYLINDER
clip) and non masslike enhancement within the LOWER OUTER LEFT
breast (BARBELL clip). No apparent complications.

## 2020-05-19 IMAGING — MG MM BREAST LOCALIZATION CLIP
4 series · 6 of 12 positions shown · non-contrast
Comparison: Previous exam(s).
COMPARISON: Previous exam(s).

Addendum:
CLINICAL DATA: 44-year-old female - evaluate biopsy markers
following 2 MR guided LEFT breast biopsies (CYLINDER clip and
BARBELL clip). Patient has existing COIL and X clips at areas of
biopsy-proven DCIS

EXAM:
DIAGNOSTIC LEFT MAMMOGRAM POST MRI BIOPSY

[L ML synth-2D]
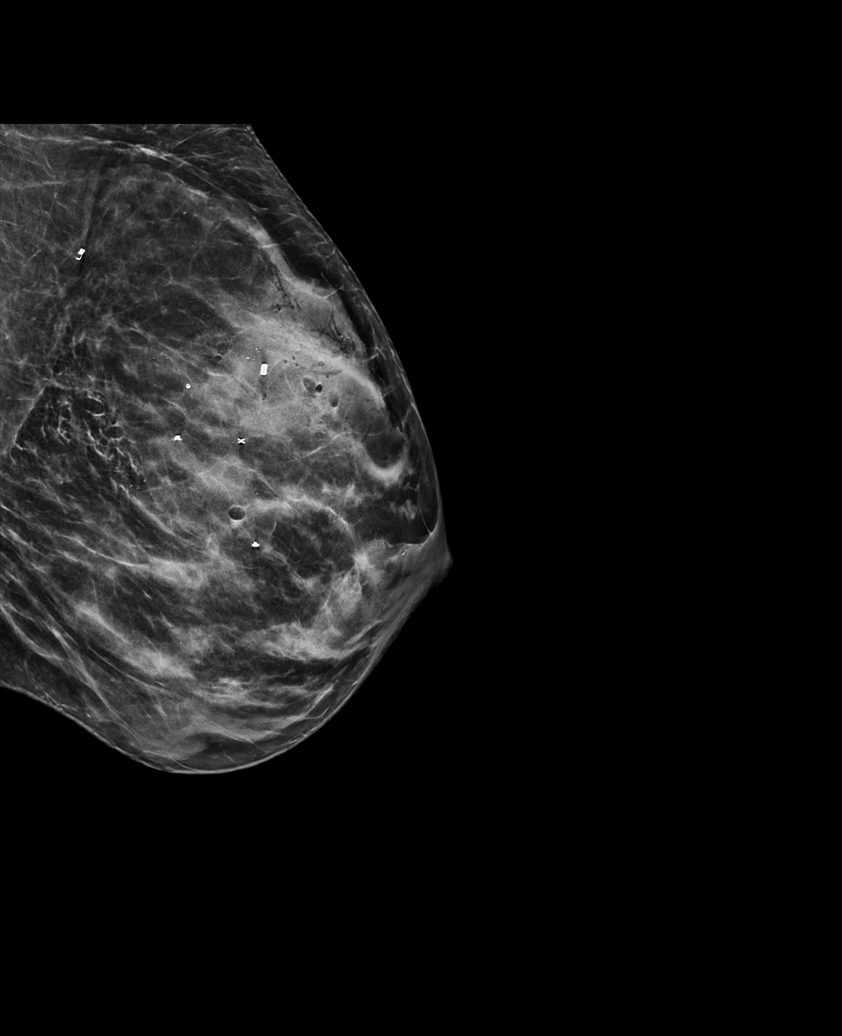

[L CC synth-2D]
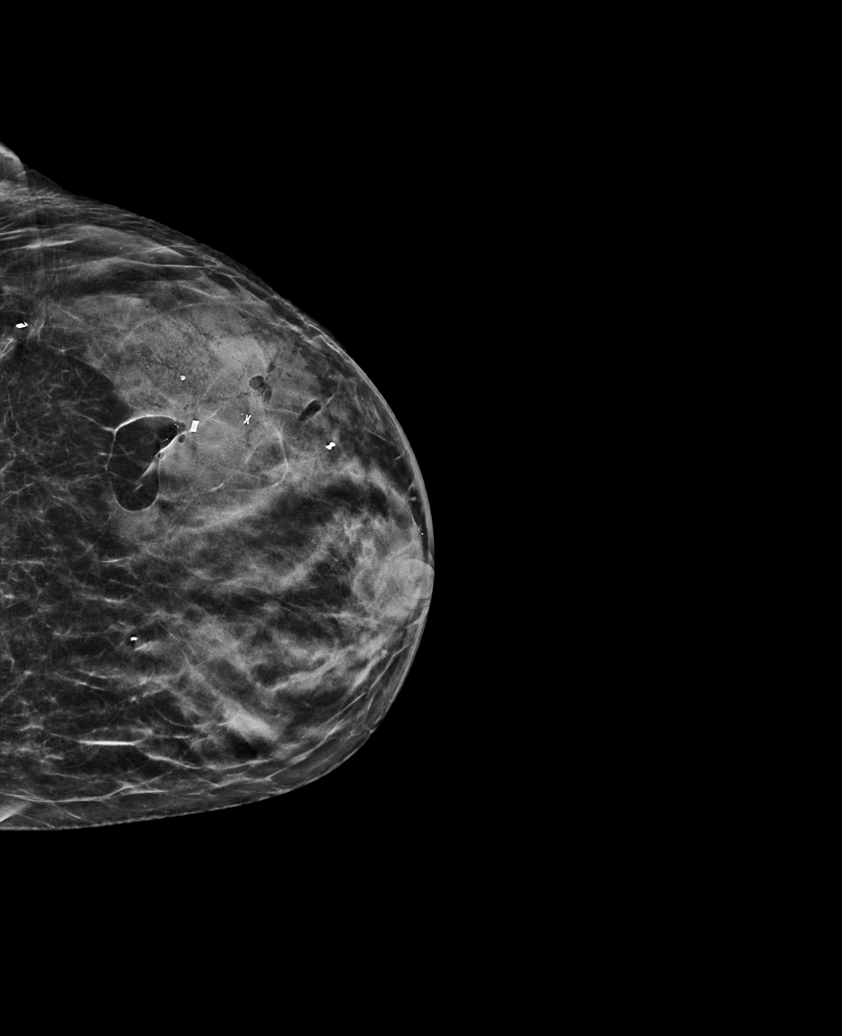

[L ML tomo · 3 of 54 frames shown]
[frame 18/54]
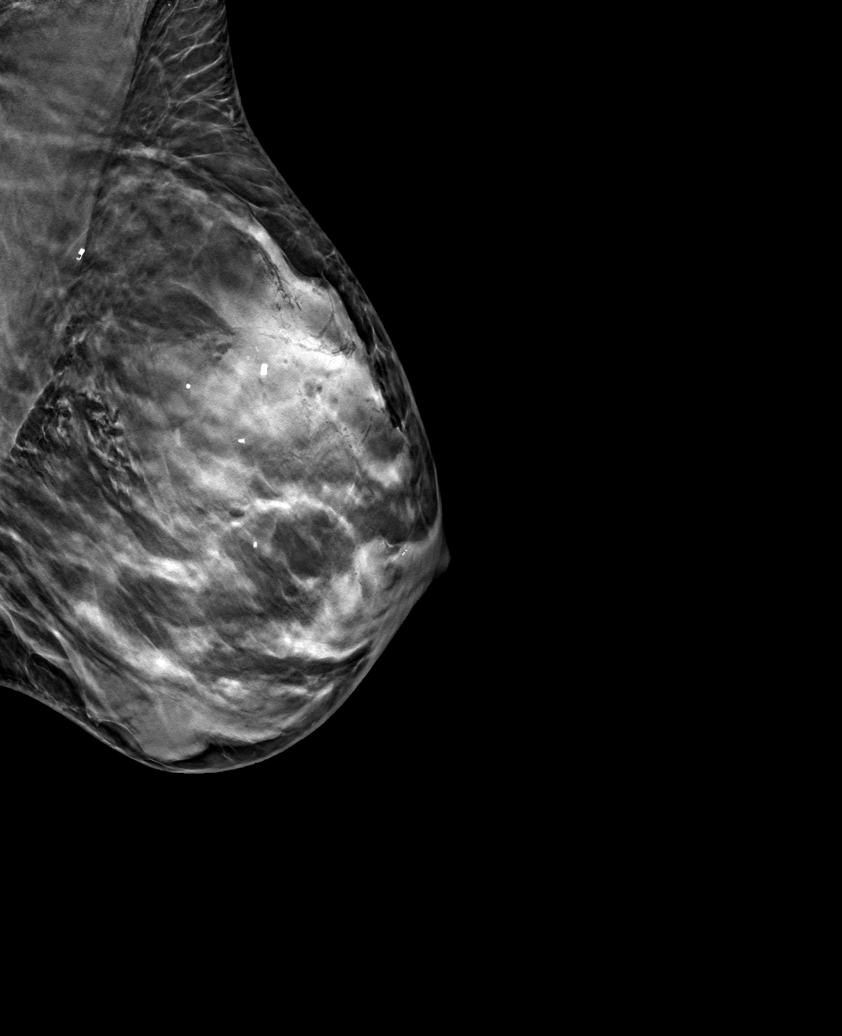
[frame 27/54]
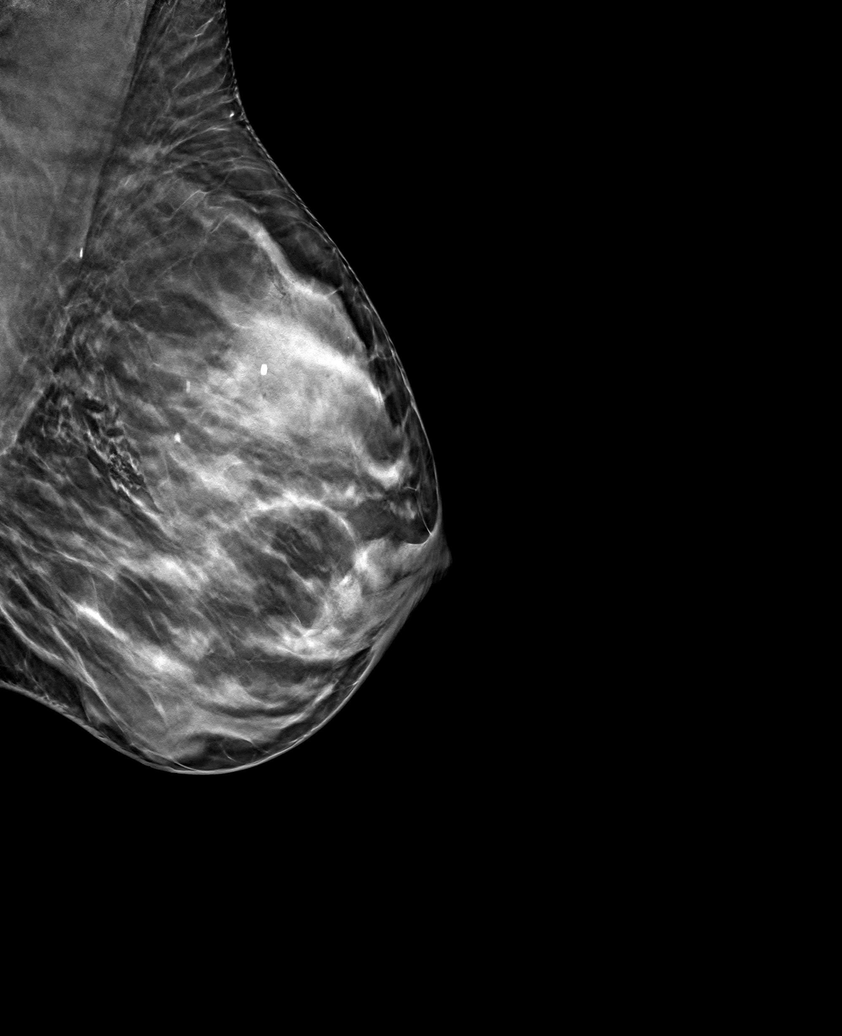
[frame 37/54]
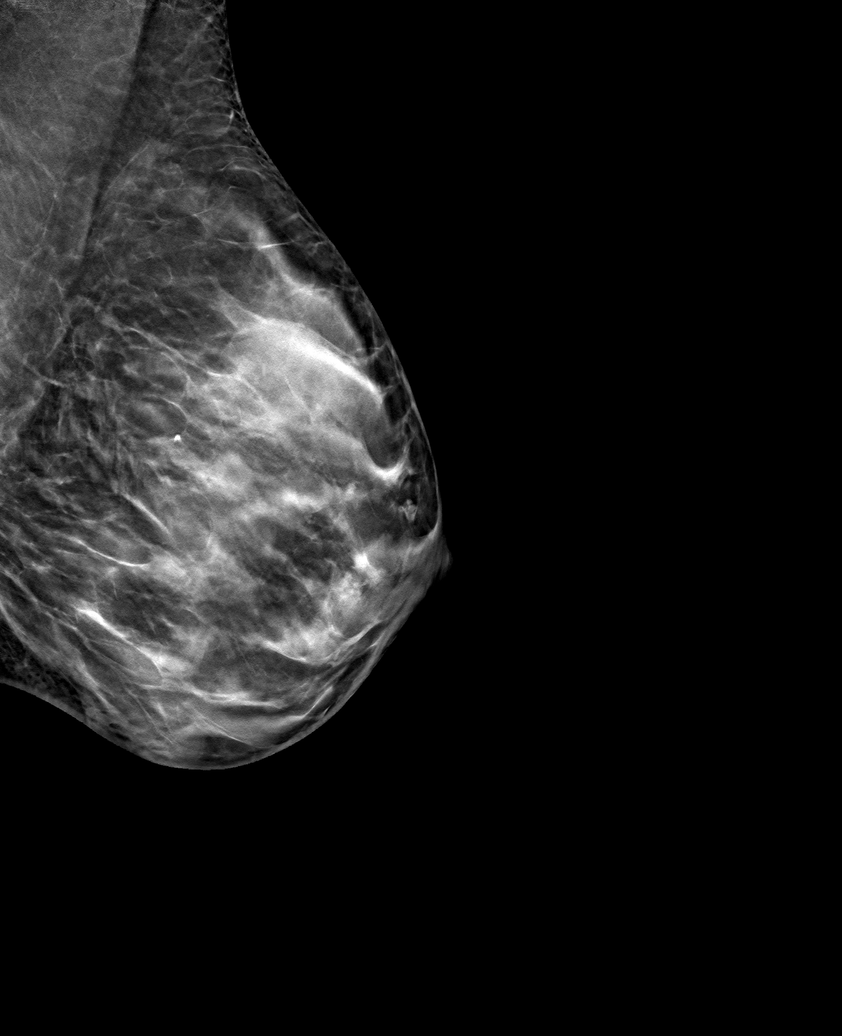

[L CC tomo · tomo slice 29/56.0]
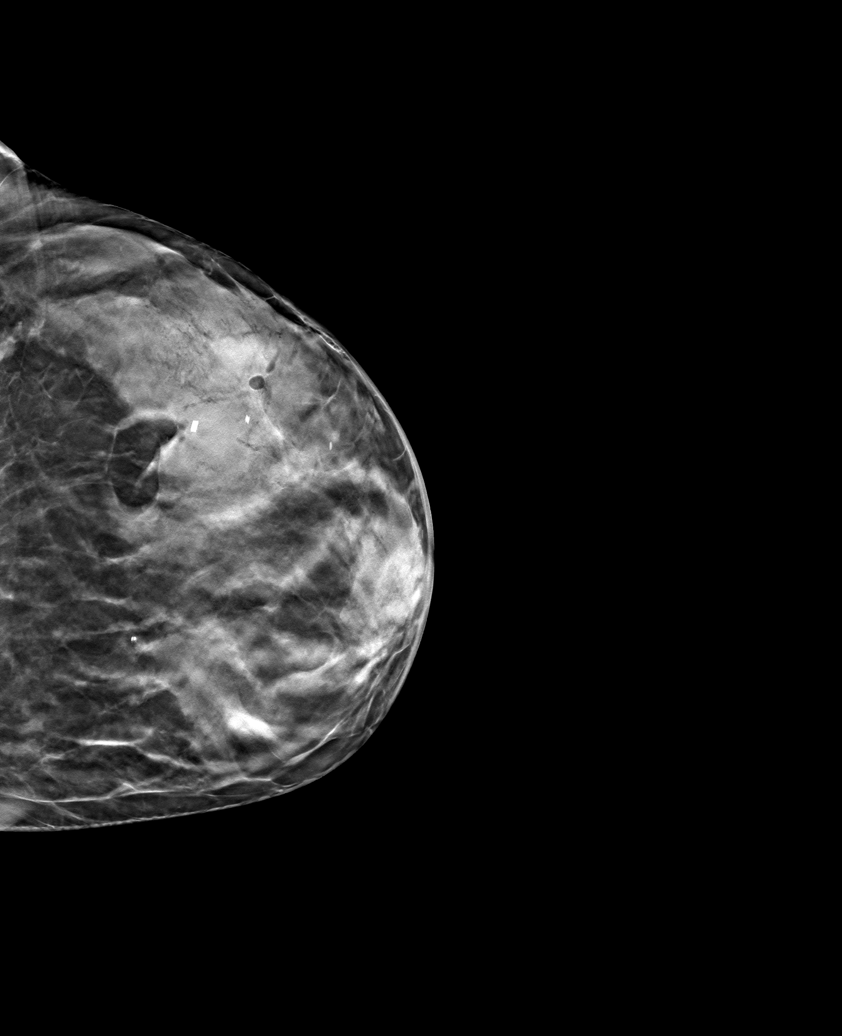

[6 of 12 positions shown; findings below may reference images not displayed]

FINDINGS: Mammographic images were obtained following MR guided biopsy of a 1
cm mass within the UPPER-OUTER LEFT breast (CYLINDER clip) and
subtle non masslike enhancement within the LOWER OUTER LEFT breast
(BARBELL clip).

The CYLINDER biopsy marking clip is in expected position at the site
of 1 cm mass biopsy within the UPPER-OUTER LEFT breast.

The BARBELL biopsy marking clip is in expected position at the site
of non masslike enhancement biopsy within the LOWER OUTER LEFT
breast.

The existing COIL and X shaped biopsy marking clips at sites of
biopsy-proven DCIS are again identified and separated by a distance
of 6.5 cm.
IMPRESSION: Appropriate positioning of the CYLINDER shaped biopsy marking clip
at the site of 1 cm mass biopsy in the UPPER-OUTER LEFT breast.

Appropriate positioning of the BARBELL biopsy marking clip at the
site of non masslike enhancement biopsy in the LOWER OUTER LEFT
breast.

Final Assessment: Post Procedure Mammograms for Marker Placement

ADDENDUM:
The most posterior clip (COIL clip) and the most anterior clip
(BARBELL clip) are separated by a distance of 9 cm.

*** End of Addendum ***
FINDINGS: Mammographic images were obtained following MR guided biopsy of a 1
cm mass within the UPPER-OUTER LEFT breast (CYLINDER clip) and
subtle non masslike enhancement within the LOWER OUTER LEFT breast
(BARBELL clip).

The CYLINDER biopsy marking clip is in expected position at the site
of 1 cm mass biopsy within the UPPER-OUTER LEFT breast.

The BARBELL biopsy marking clip is in expected position at the site
of non masslike enhancement biopsy within the LOWER OUTER LEFT
breast.

The existing COIL and X shaped biopsy marking clips at sites of
biopsy-proven DCIS are again identified and separated by a distance
of 6.5 cm.
IMPRESSION: Appropriate positioning of the CYLINDER shaped biopsy marking clip
at the site of 1 cm mass biopsy in the UPPER-OUTER LEFT breast.

Appropriate positioning of the BARBELL biopsy marking clip at the
site of non masslike enhancement biopsy in the LOWER OUTER LEFT
breast.

Final Assessment: Post Procedure Mammograms for Marker Placement

## 2020-05-19 MED ORDER — GADOBUTROL 1 MMOL/ML IV SOLN
6.0000 mL | Freq: Once | INTRAVENOUS | Status: AC | PRN
  Administered 2020-05-19: 6 mL via INTRAVENOUS

## 2020-05-20 ENCOUNTER — Encounter: Payer: Self-pay | Admitting: *Deleted

## 2020-05-20 NOTE — Telephone Encounter (Signed)
Error

## 2020-05-21 ENCOUNTER — Telehealth: Payer: Self-pay | Admitting: *Deleted

## 2020-05-21 NOTE — Telephone Encounter (Signed)
Called pt and confirmed appt with Dr. Lindi Adie on 6/7 at 12:30 by virtual appts.

## 2020-05-23 NOTE — Progress Notes (Signed)
Patient Care Team: Reynold Bowen, MD as PCP - General (Endocrinology) Gwyndolyn Kaufman, RN as Registered Nurse  DIAGNOSIS:    ICD-10-CM   1. Malignant neoplasm of upper-outer quadrant of left breast in female, estrogen receptor positive ([place])  C50.412    Z17.0     SUMMARY OF ONCOLOGIC HISTORY: Oncology History  Malignant neoplasm of upper-outer quadrant of left breast in female, estrogen receptor positive ([place])  03/02/2020 Initial Diagnosis   Screening mammogram revealed left breast calcifications 5.8 cm overall.  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    03/28/2020 Breast MRI   two sites of biopsy proven disease in the upper outer left breast with subtle non-mass enhancement extending between them suspicious for invasive disease.  Additional 1 cm nodule between the 2 biopsy sites.  Total span 10 cm   05/19/2020 Initial Biopsy   Left breast biopsy UOQ: Grade 3 IDC with DCIS, LOQ: DCIS with calcifications and necrosis, invasive cancer ER 95%, PR 80%, Ki-67 30%, HER-2 negative (1+)   05/24/2020 Cancer Staging   Staging form: Breast, AJCC 8th Edition - Clinical: Stage IA (cT1b, cN0, cM0, G3, ER+, PR+, HER2-) - Signed by Nicholas Lose, MD on 05/24/2020     CHIEF COMPLIANT: Follow-up to discuss treatment plan   INTERVAL HISTORY: Tracy Guerra is a 44 y.o. with above-mentioned history of left breast DCIS. Left breast biopsy of the left breast mass showed invasive ductal carcinoma, grade 3, HER-2 negative (1+), ER+ 95%, PR+ 80%, Ki67 30%. She presents to the clinic today to discuss the pathology report and treatment plan.   ALLERGIES:  is allergic to avelox [moxifloxacin hcl in nacl].  MEDICATIONS:  Current Outpatient Medications  Medication Sig Dispense Refill  . Cholecalciferol (VITAMIN D) 2000 UNITS CAPS Take 1 capsule by mouth daily.      . Docosahexaenoic Acid (DHA COMPLETE PO) Take 1 capsule by mouth daily.      . fluticasone (FLONASE) 50 MCG/ACT nasal  spray Place into the nose.    . levothyroxine (SYNTHROID) 25 MCG tablet Take 1-2 tablets (25-50 mcg total) by mouth daily. Take 25 mcg 2 times a week and 50 mcg twice a week    . nystatin-triamcinolone (MYCOLOG II) cream APPLY TO THE AFFECTED AREA TWICE DAILY AS NEEDED FOR MORNING AND EVENING    . Selenium 100 MCG CAPS Take 1 capsule (100 mcg total) by mouth daily.  0  . tamoxifen (NOLVADEX) 20 MG tablet Take 1 tablet (20 mg total) by mouth daily. 90 tablet 3   No current facility-administered medications for this visit.    PHYSICAL EXAMINATION: ECOG PERFORMANCE STATUS: 1 - Symptomatic but completely ambulatory  There were no vitals filed for this visit. There were no vitals filed for this visit.  LABORATORY DATA:  I have reviewed the data as listed No flowsheet data found.  Lab Results  Component Value Date   WBC 6.4 09/07/2018   HGB 12.7 09/07/2018   HCT 38.2 09/07/2018   MCV 97.0 09/07/2018   PLT 245 09/07/2018   NEUTROABS 3.6 09/07/2018    ASSESSMENT & PLAN:  Malignant neoplasm of upper-outer quadrant of left breast in female, estrogen receptor positive ([place]) 03/02/2020: Screening mammogram revealed left breast calcifications 5.8 cm, 2 biopsies revealed intermediate grade DCIS ER 70%, PR 80%, breast MRI non-mass enhancement measuring 10 cm 05/19/2020:Left breast biopsy UOQ: Grade 3 IDC with DCIS, LOQ: DCIS with calcifications and necrosis, invasive cancer ER 95%, PR 80%, Ki-67 30%, HER-2  negative (1+)  Treatment plan: 1.  Neoadjuvant antiestrogen therapy until September 2021: Tamoxifen 20 mg daily started 05/24/2020 2.  Left mastectomy with sentinel lymph node biopsy 3.  Oncotype DX testing to determine chemotherapy benefit (of this to be sent on the biopsy material) 4.  Plus/minus adjuvant radiation 5.  Adjuvant antiestrogen therapy with tamoxifen x10 years  We will do a virtual visit in 1 month to assess toxicities. We will call her with results of Oncotype DX test.   No  orders of the defined types were placed in this encounter.  The patient has a good understanding of the overall plan. she agrees with it. she will call with any problems that may develop before the next visit here.  Total time spent: 30 mins including face to face time and time spent for planning, charting and coordination of care  Nicholas Lose, MD 05/24/2020  I, Cloyde Reams Dorshimer, am acting as scribe for Dr. Nicholas Lose.  I have reviewed the above documentation for accuracy and completeness, and I agree with the above.

## 2020-05-24 ENCOUNTER — Encounter: Payer: Self-pay | Admitting: *Deleted

## 2020-05-24 ENCOUNTER — Telehealth (HOSPITAL_BASED_OUTPATIENT_CLINIC_OR_DEPARTMENT_OTHER): Payer: PRIVATE HEALTH INSURANCE | Admitting: Hematology and Oncology

## 2020-05-24 ENCOUNTER — Telehealth: Payer: Self-pay | Admitting: *Deleted

## 2020-05-24 DIAGNOSIS — C50412 Malignant neoplasm of upper-outer quadrant of left female breast: Secondary | ICD-10-CM | POA: Diagnosis not present

## 2020-05-24 DIAGNOSIS — Z170 Estrogen receptor positive status [ER+]: Secondary | ICD-10-CM | POA: Diagnosis not present

## 2020-05-24 MED ORDER — TAMOXIFEN CITRATE 20 MG PO TABS
20.0000 mg | ORAL_TABLET | Freq: Every day | ORAL | 3 refills | Status: DC
Start: 2020-05-24 — End: 2021-03-04

## 2020-05-24 NOTE — Telephone Encounter (Signed)
Received order for Oncotype testing on CORE bx. Requisition faxed to pathology and [place].

## 2020-05-24 NOTE — Assessment & Plan Note (Signed)
03/02/2020: Screening mammogram revealed left breast calcifications 5.8 cm, 2 biopsies revealed intermediate grade DCIS ER 70%, PR 80%, breast MRI non-mass enhancement measuring 10 cm 05/19/2020:Left breast biopsy UOQ: Grade 3 IDC with DCIS, LOQ: DCIS with calcifications and necrosis, invasive cancer ER 95%, PR 80%, Ki-67 30%, HER-2 negative (1+)  Treatment plan: 1.  Left mastectomy with sentinel lymph node biopsy 2.  Oncotype DX testing to determine chemotherapy benefit 3.  Plus/minus adjuvant radiation 4.  Adjuvant antiestrogen therapy with tamoxifen x10 years  Return to clinic after surgery to discuss the final pathology report.

## 2020-05-25 ENCOUNTER — Encounter: Payer: Self-pay | Admitting: Licensed Clinical Social Worker

## 2020-05-25 NOTE — Progress Notes (Signed)
[name]  Holiday representative spoke with patient by phone as patient has questions about support services. CSW explained role and other support services team members and programming.  Patient is interested in support group as well as individual support as her treatment plan is determined and begins. She also would like support for her husband and 8.5yo child. Child is connected with [facility] and CSW welcomed husband to speak with CSW or attend on online group through a program like [facility]. CSW sent information on other support services programs and guides on how to talk with your child about cancer.   Patient also has questions about her nutrition, CSW will send referral to dietitian.  Patient is interested in having some sessions with CSW for emotional support but will need to check her schedule first.     Dorthy Magnussen, [place], [place]

## 2020-06-01 NOTE — Progress Notes (Signed)
Nutrition  Provided nutrition information in response to email patient sent.  Contact information provided.  Questions answered.   Giorgia Wahler B. Zenia Resides, [place], [place] Registered Dietitian [phone] (pager)

## 2020-06-07 ENCOUNTER — Encounter: Payer: Self-pay | Admitting: *Deleted

## 2020-06-09 ENCOUNTER — Telehealth: Payer: Self-pay | Admitting: Hematology and Oncology

## 2020-06-09 NOTE — Telephone Encounter (Signed)
Release: [phone] Faxed medical records to [place] Dept @ [phone]

## 2020-06-11 ENCOUNTER — Encounter: Payer: Self-pay | Admitting: *Deleted

## 2020-06-15 ENCOUNTER — Telehealth: Payer: Self-pay | Admitting: Hematology and Oncology

## 2020-06-15 NOTE — Telephone Encounter (Signed)
Oncotype DX recurrence score: 22 Distant recurrence at 9 years: 8% Chemotherapy benefit for patients less than age 46: 6.5% absolute benefit for her age group Recommendation systemic chemotherapy with Taxotere and Cytoxan x4 cycles However the benefit of chemotherapy may be secondary to ovarian function suppression.  The alternative chemotherapy would be complete estrogen suppression with Zoladex and aromatase inhibitor

## 2020-06-18 ENCOUNTER — Telehealth: Payer: Self-pay | Admitting: *Deleted

## 2020-06-18 ENCOUNTER — Encounter: Payer: Self-pay | Admitting: *Deleted

## 2020-06-18 NOTE — Telephone Encounter (Signed)
Spoke with patient regarding her discussion with Dr. Lindi Adie.  She is requesting a 2nd opinion at [facility] with Dr. Erline Levine. Called and she is scheduled with her 1st available on 8/2 and Dr. Tivis Ringer on 8/3. Gave her contact info if she wishes to change the appointment to a different provider that may be able to get her seen sooner.  Sent message for referral for alight guide and gave her contact info for [facility] as she requested.

## 2020-06-23 ENCOUNTER — Encounter: Payer: Self-pay | Admitting: *Deleted

## 2020-06-30 ENCOUNTER — Encounter: Payer: Self-pay | Admitting: Hematology and Oncology

## 2020-07-01 ENCOUNTER — Encounter: Payer: Self-pay | Admitting: *Deleted

## 2020-07-02 ENCOUNTER — Encounter: Payer: Self-pay | Admitting: *Deleted

## 2020-07-22 ENCOUNTER — Encounter: Payer: Self-pay | Admitting: *Deleted

## 2020-07-22 ENCOUNTER — Telehealth: Payer: Self-pay | Admitting: *Deleted

## 2020-07-22 ENCOUNTER — Encounter: Payer: Self-pay | Admitting: Licensed Clinical Social Worker

## 2020-07-22 NOTE — Telephone Encounter (Signed)
Left voicemail for a return phone call for an update on her decision regarding sx and plan since her appointment at [facility]

## 2020-07-22 NOTE — Progress Notes (Signed)
[place] CSW Progress Note  Clinical Education officer, museum received call from patient for coping and adjustment support. Patient had second opinion at [facility] and consensus was she will need a mastectomy. This has been a scary and depressing thought for her. She has reached out to the [employer] for questions about logistics of appts and what to expect pre and post-op.   Otherwise, she questioning the "why" behind the cause of her cancer and is sometimes getting down on herself for not preventing it. CSW provided empathic listening and discussed automatic negative thoughts and ways to start to counter them. Patient has also been able to go for a walk and use meditative strategies with success.  Follow up: Patient would like to do regular check-ins by video visit or phone for concrete strategies or books to help to cope. Appt scheduled for 8/9 at 1:30pm (pt on vacation 8/11-8/17)    Edwinna Areola Orva Gwaltney , LCSW

## 2020-07-23 ENCOUNTER — Telehealth: Payer: Self-pay | Admitting: Licensed Clinical Social Worker

## 2020-07-26 ENCOUNTER — Inpatient Hospital Stay: Payer: Self-pay | Attending: Hematology and Oncology | Admitting: Licensed Clinical Social Worker

## 2020-07-26 ENCOUNTER — Other Ambulatory Visit: Payer: Self-pay | Admitting: General Surgery

## 2020-07-26 DIAGNOSIS — C50412 Malignant neoplasm of upper-outer quadrant of left female breast: Secondary | ICD-10-CM

## 2020-07-26 DIAGNOSIS — Z170 Estrogen receptor positive status [ER+]: Secondary | ICD-10-CM

## 2020-07-26 NOTE — Progress Notes (Signed)
[place] CSW Progress Note  Holiday representative met with patient via virtual visit to provide ongoing support. Patient had a change in childcare so was unable to do full visit today. Scheduled for 08/10/20 when patient is back from vacation.    Edwinna Areola Paetyn Pietrzak LCSW

## 2020-07-27 ENCOUNTER — Encounter: Payer: Self-pay | Admitting: *Deleted

## 2020-07-27 ENCOUNTER — Telehealth: Payer: Self-pay | Admitting: Hematology and Oncology

## 2020-07-27 NOTE — Telephone Encounter (Signed)
Scheduled appointment per 8/10 scheduling message. Left message with appointment date and time.

## 2020-08-02 ENCOUNTER — Encounter: Payer: Self-pay | Admitting: *Deleted

## 2020-08-04 ENCOUNTER — Encounter: Payer: Self-pay | Admitting: Podiatry

## 2020-08-04 ENCOUNTER — Other Ambulatory Visit: Payer: Self-pay

## 2020-08-04 ENCOUNTER — Ambulatory Visit (INDEPENDENT_AMBULATORY_CARE_PROVIDER_SITE_OTHER): Payer: PRIVATE HEALTH INSURANCE | Admitting: Podiatry

## 2020-08-04 DIAGNOSIS — B070 Plantar wart: Secondary | ICD-10-CM | POA: Diagnosis not present

## 2020-08-04 NOTE — H&P (Signed)
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  HPI  Here for follow up discussion breast reconstruction prior to planned left mastectomy. Presented following screening MMG 02/2020 with left breast calcifications. Diagnostic mammogram on 03/02/20 showed left breast calcifications spanning 5.8 cm. 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  MRI demonstrated the two sites of biopsy proven disease left UOQ with NME extending between them suspicious for invasive disease. Second look US showed no discrete mass. MR guided biopsies completed labeled left UO showed IDC with DCIS, ER/PR+, Her2-  and left lower outer showed DCIS with calcs and necrosis high grade.  Oncotype on core bx 22/8% Oncology discussed systemic chemotherapy vs ovarian function suppression. On neoadjuvant tamoxifen.  Mother with history DCIS, underwent lumpectomy/RT. Cousin with ovarian ca. Genetics negative  Current 34 C/D. Weight down 15 since beginning year by intention, goal 135 but was on program that had soy based products and she stopped these at time diagnosis.  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     Heart sounds: Normal heart sounds.  Pulmonary:     Effort: Pulmonary effort is normal.     Breath sounds: Normal breath sounds.  Abdominal:     Comments: Small volume for reconstruction  Lymphadenopathy:     Upper Body:     Right upper body: No axillary adenopathy.     Left upper body: No axillary adenopathy.  Skin:    Comments: Fitzpatrick 4    No palpable masses Grade 3 ptosis bilateral SN to nipple R 24.5 L 25 cm BW R 15.5 L 16 cm CW 13 cm Nipple to IMF R 8.5 L 10 cm    Assessment:     Left breast ca UOQ ER+    Plan:     Plan left NSM with immediate expander acellular dermis  reconstruction.  Revieweddrains, OR length, hospital stay and recovery, limitations. Discussed process of expansion and implant based risks including rupture, MRI surveillance for silicone implants, infection requiring surgery or removal, contracture. Reviewed risks mastectomy flap necrosis requiring additional surgery.Reviewedreconstructionwill be asensate and not stimulate.  Discussed use of acellular dermis in reconstruction, cadaveric source, incorporation over several weeks, risk that if has seroma or infection can act as additional nidus for infection if not incorporated.  Discussed prepectoral vs sub pectoral reconstruction. Discussed with patient and benefit of this is no animation deformity, may be less pain. Risk may be more visible rippling over upper poles, greater need of ADM. Reviewed pre pectoral would require larger amount acellular dermis, more drains. Discussed any type reconstruction also risks long term displacement implant and visible rippling. If prepectoral counseled I would recommend she be comfortable with silicone implants as more options that have less rippling. She agrees to prepectoral placement.  Reviewed reconstruction will be asensate and not stimulate. Reviewed additional risks including but not limited to risks mastectomy flap necrosis requiring additional surgery, seroma, hematoma, asymmetry, need to additional procedures, fat necrosis, DVT/PE, damage to adjacent structures, cardiopulmonary complications.  Counseled her that she has ptosis and in setting NSM risk is malposition NAC over implant. Reviewed may have more soft tissue contraction in setting TE as can control volume of this.  Reviewed asymmetry with opposite breast would be present and would need additional surgery to  address opposite breast.   Rx for [place] given. Rx for tramadol, robaxin, and Bactrim given.

## 2020-08-05 NOTE — Progress Notes (Signed)
Subjective:   Patient ID: Tracy Guerra, female   DOB: 44 y.o.   MRN: [id number]   HPI Patient presents stating that she has the lesion back on her right big toe and also she is noted on her second toe left foot.  She is due to have her left breast removed in 2 weeks with cancer and states that this was doing well but it really has occurred   ROS      Objective:  Physical Exam  Neurovascular status intact with lesion on the right plantar lateral aspect of the right hallux that upon debridement shows pinpoint bleeding pain to lateral pressure and a small lesion on the left second digit when debrided shows pinpoint bleeding.  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

## 2020-08-10 ENCOUNTER — Other Ambulatory Visit: Payer: Self-pay

## 2020-08-10 ENCOUNTER — Inpatient Hospital Stay: Payer: Self-pay | Admitting: Licensed Clinical Social Worker

## 2020-08-10 ENCOUNTER — Encounter: Payer: Self-pay | Admitting: General Practice

## 2020-08-10 DIAGNOSIS — Z170 Estrogen receptor positive status [ER+]: Secondary | ICD-10-CM

## 2020-08-10 NOTE — Progress Notes (Signed)
[place] CSW Progress Note  Holiday representative met with patient to provide coping support.  Patient scheduled for single mastectomy on 08/18/20. She has been slowly telling friends about diagnosis and receiving a lot of messages and calls that is a little overwhelming. Discussed strategies for giving updates ([place], having point people) as well as setting boundaries and making clear requests for specific help.   Discussed strategies to cope with changes post-surgery and concerns about relying on husband to do most tasks at home since they are usually split. Found ways to focus on what she can do, including expressing appreciation, and helpful thinking patterns in addition to coping with meditation, arts, music, etc.  Patient interested in speaking with chaplain about spiritual components. Request sent to [place]. [place].  Follow-up: CSW to check-in by phone after CSW returns 9/20. Will request another team member to call the week of 9/7   Edwinna Areola Gail Vendetti LCSW

## 2020-08-10 NOTE — Progress Notes (Signed)
[facility] Spiritual Care Note  Left voicemail, encouraging callback, per referral from [facility].   [place], [place], [facility] Pager [phone] Voicemail [phone]

## 2020-08-12 ENCOUNTER — Encounter (HOSPITAL_BASED_OUTPATIENT_CLINIC_OR_DEPARTMENT_OTHER): Payer: Self-pay | Admitting: Plastic Surgery

## 2020-08-12 ENCOUNTER — Other Ambulatory Visit: Payer: Self-pay

## 2020-08-13 MED ORDER — ENSURE PRE-SURGERY PO LIQD: 296.0000 mL | Freq: Once | ORAL | Status: DC

## 2020-08-13 NOTE — Progress Notes (Signed)

## 2020-08-14 ENCOUNTER — Other Ambulatory Visit (HOSPITAL_COMMUNITY): Payer: PRIVATE HEALTH INSURANCE

## 2020-08-17 ENCOUNTER — Ambulatory Visit: Payer: PRIVATE HEALTH INSURANCE | Attending: General Surgery | Admitting: Physical Therapy

## 2020-08-17 ENCOUNTER — Other Ambulatory Visit: Payer: Self-pay

## 2020-08-17 ENCOUNTER — Encounter: Payer: Self-pay | Admitting: Physical Therapy

## 2020-08-17 ENCOUNTER — Other Ambulatory Visit (HOSPITAL_COMMUNITY)
Admission: RE | Admit: 2020-08-17 | Discharge: 2020-08-17 | Disposition: A | Discharge status: Home / Self Care | Payer: PRIVATE HEALTH INSURANCE | Source: Ambulatory Visit | Attending: Plastic Surgery | Admitting: Plastic Surgery

## 2020-08-17 DIAGNOSIS — Z20822 Contact with and (suspected) exposure to covid-19: Secondary | ICD-10-CM | POA: Insufficient documentation

## 2020-08-17 DIAGNOSIS — R293 Abnormal posture: Secondary | ICD-10-CM | POA: Diagnosis not present

## 2020-08-17 DIAGNOSIS — Z01812 Encounter for preprocedural laboratory examination: Secondary | ICD-10-CM | POA: Diagnosis present

## 2020-08-17 DIAGNOSIS — C50412 Malignant neoplasm of upper-outer quadrant of left female breast: Secondary | ICD-10-CM

## 2020-08-17 LAB — SARS CORONAVIRUS 2 (TAT 6-24 HRS): SARS Coronavirus 2: NEGATIVE

## 2020-08-17 NOTE — Therapy (Signed)
[place], [place], [address] Phone: [phone]   Fax:  [phone]  Physical Therapy Evaluation  Patient Details  Name: Tracy Guerra MRN: [id number] Date of Birth: [birth year] Referring Provider (PT): Donne Hazel   Encounter Date: 08/17/2020   PT End of Session - 08/17/20 1351    Visit Number 1    Number of Visits 2   eval and 1 f/u   Date for PT Re-Evaluation 09/14/20    PT Start Time 1306    PT Stop Time 1350    PT Time Calculation (min) 44 min    Activity Tolerance Patient tolerated treatment well    Behavior During Therapy [facility] for tasks assessed/performed           Past Medical History:  Diagnosis Date   Complication of anesthesia    slow to wake   Hypothyroidism     Past Surgical History:  Procedure Laterality Date   NASAL SEPTUM SURGERY     vaginal polyp removed      There were no vitals filed for this visit.    Subjective Assessment - 08/17/20 1312    Subjective I have my surgery tomrrow.    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 No/denies              [facility] PT Assessment - 08/17/20 0001      Assessment   Medical Diagnosis left breast cancer    Referring Provider (PT) Donne Hazel    Onset Date/Surgical Date 08/18/20    Hand Dominance Right    Prior Therapy none      Precautions   Precautions Other (comment)    Precaution Comments active cancer      Restrictions   Weight Bearing Restrictions No      Balance Screen   Has the patient fallen in the past 6 months No    Has the patient had a decrease in activity level because of a fear of falling?  No    Is the patient reluctant to leave their home because of a fear of falling?  No      Home Environment   Living Environment Private residence    Living Arrangements  Spouse/significant other;Children    Available Help at Discharge Family    Type of [place]      Prior Function   Level of [place] Part time employment    Vocation Requirements pediatric dentist    Leisure pt walks for exercise and does yoga      Cognition   Overall Cognitive Status Within Functional Limits for tasks assessed      Posture/Postural Control   Posture/Postural Control Postural limitations    Postural Limitations Rounded Shoulders;Forward head      ROM / Strength   AROM / PROM / Strength AROM      AROM   AROM Assessment Site Shoulder    Right/Left Shoulder Right;Left    Right Shoulder Flexion 167 Degrees    Right Shoulder ABduction 175 Degrees    Right Shoulder Internal Rotation 65 Degrees    Right Shoulder External Rotation 90 Degrees    Left Shoulder Flexion 163 Degrees    Left Shoulder ABduction 179 Degrees    Left Shoulder Internal Rotation 65 Degrees    Left Shoulder External  Rotation 90 Degrees             LYMPHEDEMA/ONCOLOGY QUESTIONNAIRE - 08/17/20 0001      Type   Cancer Type left breast cancer      Surgeries   Mastectomy Date 08/18/20    Sentinel Lymph Node Biopsy Date 08/18/20      Lymphedema Assessments   Lymphedema Assessments Upper extremities      Right Upper Extremity Lymphedema   15 cm Proximal to Olecranon Process 29 cm    Olecranon Process 23.8 cm    15 cm Proximal to Ulnar Styloid Process 21.8 cm    Just Proximal to Ulnar Styloid Process 15.1 cm    Across Hand at [employer] 18.5 cm    At [place] of 2nd Digit 5.8 cm      Left Upper Extremity Lymphedema   15 cm Proximal to Olecranon Process 28.5 cm    Olecranon Process 24 cm    15 cm Proximal to Ulnar Styloid Process 21.5 cm    Just Proximal to Ulnar Styloid Process 14 cm    Across Hand at [employer] 27.5 cm    At [place] of 2nd Digit 5.6 cm           L-DEX FLOWSHEETS - 08/17/20 1300      L-DEX LYMPHEDEMA SCREENING   Measurement  Type Unilateral    L-DEX MEASUREMENT EXTREMITY Upper Extremity    POSITION  Standing    DOMINANT SIDE Right    At Risk Side Left    BASELINE SCORE (UNILATERAL) -0.9            The patient was assessed using the L-Dex machine today to produce a lymphedema index baseline score. The patient will be reassessed on a regular basis (typically every 3 months) to obtain new L-Dex scores. If the score is > 6.5 points away from his/her baseline score indicating onset of subclinical lymphedema, it will be recommended to wear a compression garment for 4 weeks, 12 hours per day and then be reassessed. If the score continues to be > 6.5 points from baseline at reassessment, we will initiate lymphedema treatment. Assessing in this manner has a 95% rate of preventing clinically significant lymphedema.       Objective measurements completed on examination: See above findings.      Patient will follow up at outpatient cancer rehab 3-4 weeks following surgery.  If the patient requires physical therapy at that time, a specific plan will be dictated and sent to the referring physician for approval. The patient was educated today on appropriate basic range of motion exercises to begin post operatively and the importance of attending the After Breast Cancer class following surgery.  Patient was educated today on lymphedema risk reduction practices as it pertains to recommendations that will benefit the patient immediately following surgery.  She verbalized good understanding.            PT Education - 08/17/20 1354    Education Details ABC class, anatomy and physiology of lymphatic system, lymphedema risk reduction practices, post op breast exercises    Person(s) Educated Patient    Methods Explanation;Handout    Comprehension Verbalized understanding               PT Long Term Goals - 08/17/20 1354      PT LONG TERM GOAL #1   Title Pt will return to baseline ROM measurements to allow pt to  return to PLOF.    Time  4    Period Weeks    Status New    Target Date 09/14/20           Breast Clinic Goals - 08/17/20 1353      Patient will be able to verbalize understanding of pertinent lymphedema risk reduction practices relevant to her diagnosis specifically related to skin care.   Status Achieved      Patient will be able to return demonstrate and/or verbalize understanding of the post-op home exercise program related to regaining shoulder range of motion.   Status Achieved      Patient will be able to verbalize understanding of the importance of attending the postoperative After Breast Cancer Class for further lymphedema risk reduction education and therapeutic exercise.   Status Achieved                 Plan - 08/17/20 1351    Clinical Impression Statement Pt presents to PT with newly diagnosed left breast cancer (DCIS). She will undergo a left mastectomy and SLNB on 08/18/20.Marland Kitchen Baseline ROM and SOZO measurements taken today. She will benefit from a post op PT reassessment to determine needs and in every 3 months for additonal L dex screening to detect subclinical lymphedema.    Stability/Clinical Decision Making Stable/Uncomplicated    Clinical Decision Making Low    Rehab Potential Excellent    PT Frequency --   eval and 1 f/u   PT Duration 4 weeks    PT Treatment/Interventions ADLs/Self Care Home Management;Patient/family education;Therapeutic exercise    PT Next Visit Plan reassess baselines, does she need radiation?    Consulted and Agree with Plan of Care Patient           Patient will benefit from skilled therapeutic intervention in order to improve the following deficits and impairments:  Postural dysfunction, Decreased knowledge of precautions  Visit Diagnosis: Abnormal posture  Malignant neoplasm of upper-outer quadrant of left female breast, unspecified estrogen receptor status ([place])     Problem List Patient Active Problem List   Diagnosis  Date Noted   Malignant neoplasm of upper-outer quadrant of left breast in female, estrogen receptor positive ([place]) 04/13/2020    Allyson Sabal [facility] 08/17/2020, 1:56 PM  [place], [place], [address] Phone: [phone]   Fax:  [phone]  Name: Tracy Guerra MRN: [id number] Date of Birth: Feb 01, 1976  Manus Gunning, PT 08/17/20 1:57 PM

## 2020-08-17 NOTE — Patient Instructions (Signed)
Patient was instructed today in a home exercise program today for post op shoulder range of motion. These included active assist shoulder flexion in sitting, scapular retraction, wall walking with shoulder abduction, and hands behind head external rotation.  She was encouraged to do these twice a day, holding 3 seconds and repeating 5 times when permitted by her physician. °  °

## 2020-08-18 ENCOUNTER — Ambulatory Visit (HOSPITAL_COMMUNITY)
Admission: RE | Admit: 2020-08-18 | Discharge: 2020-08-18 | Disposition: A | Discharge status: Home / Self Care | Payer: PRIVATE HEALTH INSURANCE | Source: Ambulatory Visit | Attending: General Surgery | Admitting: General Surgery

## 2020-08-18 ENCOUNTER — Encounter (HOSPITAL_BASED_OUTPATIENT_CLINIC_OR_DEPARTMENT_OTHER)
Admission: RE | Disposition: A | Discharge status: Home / Self Care | Payer: Self-pay | Source: Home / Self Care | Attending: General Surgery

## 2020-08-18 ENCOUNTER — Observation Stay (HOSPITAL_BASED_OUTPATIENT_CLINIC_OR_DEPARTMENT_OTHER)
Admission: RE | Admit: 2020-08-18 | Discharge: 2020-08-19 | Disposition: A | Discharge status: Home / Self Care | Payer: PRIVATE HEALTH INSURANCE | Attending: General Surgery | Admitting: General Surgery

## 2020-08-18 ENCOUNTER — Other Ambulatory Visit: Payer: Self-pay

## 2020-08-18 ENCOUNTER — Ambulatory Visit (HOSPITAL_BASED_OUTPATIENT_CLINIC_OR_DEPARTMENT_OTHER): Payer: PRIVATE HEALTH INSURANCE | Admitting: Anesthesiology

## 2020-08-18 ENCOUNTER — Encounter (HOSPITAL_BASED_OUTPATIENT_CLINIC_OR_DEPARTMENT_OTHER): Payer: Self-pay | Admitting: Plastic Surgery

## 2020-08-18 DIAGNOSIS — Z9012 Acquired absence of left breast and nipple: Secondary | ICD-10-CM

## 2020-08-18 DIAGNOSIS — Z79899 Other long term (current) drug therapy: Secondary | ICD-10-CM | POA: Diagnosis not present

## 2020-08-18 DIAGNOSIS — Z170 Estrogen receptor positive status [ER+]: Secondary | ICD-10-CM

## 2020-08-18 DIAGNOSIS — C50412 Malignant neoplasm of upper-outer quadrant of left female breast: Principal | ICD-10-CM | POA: Insufficient documentation

## 2020-08-18 HISTORY — PX: NIPPLE SPARING MASTECTOMY WITH SENTINEL LYMPH NODE BIOPSY: SHX6826

## 2020-08-18 HISTORY — PX: BREAST RECONSTRUCTION WITH PLACEMENT OF TISSUE EXPANDER AND ALLODERM: SHX6805

## 2020-08-18 HISTORY — DX: Other complications of anesthesia, initial encounter: T88.59XA

## 2020-08-18 LAB — POCT PREGNANCY, URINE: Preg Test, Ur: NEGATIVE

## 2020-08-18 SURGERY — BREAST RECONSTRUCTION WITH PLACEMENT OF TISSUE EXPANDER AND ALLODERM
Anesthesia: General | Site: Breast | Laterality: Left

## 2020-08-18 MED ORDER — HYDROMORPHONE HCL 1 MG/ML IJ SOLN: 0.2500 mg | INTRAMUSCULAR | Status: DC | PRN

## 2020-08-18 MED ORDER — CEFAZOLIN SODIUM-DEXTROSE 2-4 GM/100ML-% IV SOLN
2.0000 g | Freq: Three times a day (TID) | INTRAVENOUS | Status: AC
  Administered 2020-08-18 (×2): 2 g via INTRAVENOUS
  Filled 2020-08-18 (×2): qty 100

## 2020-08-18 MED ORDER — LIDOCAINE 2% (20 MG/ML) 5 ML SYRINGE
INTRAMUSCULAR | Status: AC
  Filled 2020-08-18: qty 5

## 2020-08-18 MED ORDER — LIDOCAINE-EPINEPHRINE 1 %-1:100000 IJ SOLN
INTRAMUSCULAR | Status: AC
  Filled 2020-08-18: qty 1

## 2020-08-18 MED ORDER — OXYCODONE HCL 5 MG/5ML PO SOLN: 5.0000 mg | Freq: Once | ORAL | Status: DC | PRN

## 2020-08-18 MED ORDER — MIDAZOLAM HCL 2 MG/2ML IJ SOLN
2.0000 mg | Freq: Once | INTRAMUSCULAR | Status: AC
  Administered 2020-08-18: 2 mg via INTRAVENOUS

## 2020-08-18 MED ORDER — SODIUM CHLORIDE 0.9 % IV SOLN: INTRAVENOUS | Status: DC | PRN

## 2020-08-18 MED ORDER — FENTANYL CITRATE (PF) 100 MCG/2ML IJ SOLN
INTRAMUSCULAR | Status: AC
  Filled 2020-08-18: qty 2

## 2020-08-18 MED ORDER — SCOPOLAMINE 1 MG/3DAYS TD PT72
MEDICATED_PATCH | TRANSDERMAL | Status: AC
  Filled 2020-08-18: qty 1

## 2020-08-18 MED ORDER — ONDANSETRON 4 MG PO TBDP: 4.0000 mg | ORAL_TABLET | Freq: Four times a day (QID) | ORAL | Status: DC | PRN

## 2020-08-18 MED ORDER — ACETAMINOPHEN 500 MG PO TABS
1000.0000 mg | ORAL_TABLET | ORAL | Status: AC
  Administered 2020-08-18: 1000 mg via ORAL

## 2020-08-18 MED ORDER — FENTANYL CITRATE (PF) 100 MCG/2ML IJ SOLN
INTRAMUSCULAR | Status: DC | PRN
Start: 2020-08-18 — End: 2020-08-18
  Administered 2020-08-18 (×2): 50 ug via INTRAVENOUS

## 2020-08-18 MED ORDER — DIPHENHYDRAMINE HCL 50 MG/ML IJ SOLN
INTRAMUSCULAR | Status: DC | PRN
  Administered 2020-08-18: 6.25 mg via INTRAVENOUS

## 2020-08-18 MED ORDER — OXYCODONE HCL 5 MG PO TABS: 5.0000 mg | ORAL_TABLET | Freq: Once | ORAL | Status: DC | PRN

## 2020-08-18 MED ORDER — MORPHINE SULFATE (PF) 4 MG/ML IV SOLN: 1.0000 mg | INTRAVENOUS | Status: DC | PRN

## 2020-08-18 MED ORDER — ONDANSETRON HCL 4 MG/2ML IJ SOLN: 4.0000 mg | Freq: Four times a day (QID) | INTRAMUSCULAR | Status: DC | PRN

## 2020-08-18 MED ORDER — SODIUM CHLORIDE 0.9 % IV SOLN: INTRAVENOUS | Status: DC

## 2020-08-18 MED ORDER — CEFAZOLIN SODIUM-DEXTROSE 2-4 GM/100ML-% IV SOLN
INTRAVENOUS | Status: AC
  Filled 2020-08-18: qty 100

## 2020-08-18 MED ORDER — DIPHENHYDRAMINE HCL 50 MG/ML IJ SOLN
INTRAMUSCULAR | Status: AC
  Filled 2020-08-18: qty 1

## 2020-08-18 MED ORDER — PROMETHAZINE HCL 25 MG/ML IJ SOLN: 6.2500 mg | INTRAMUSCULAR | Status: DC | PRN

## 2020-08-18 MED ORDER — POVIDONE-IODINE 10 % EX SOLN
CUTANEOUS | Status: DC | PRN
  Administered 2020-08-18: 1 via TOPICAL

## 2020-08-18 MED ORDER — ONDANSETRON HCL 4 MG/2ML IJ SOLN
INTRAMUSCULAR | Status: DC | PRN
  Administered 2020-08-18 (×2): 4 mg via INTRAVENOUS

## 2020-08-18 MED ORDER — METHYLENE BLUE 0.5 % INJ SOLN
INTRAVENOUS | Status: AC
  Filled 2020-08-18: qty 10

## 2020-08-18 MED ORDER — SIMETHICONE 80 MG PO CHEW: 40.0000 mg | CHEWABLE_TABLET | Freq: Four times a day (QID) | ORAL | Status: DC | PRN

## 2020-08-18 MED ORDER — SCOPOLAMINE 1 MG/3DAYS TD PT72
1.0000 | MEDICATED_PATCH | TRANSDERMAL | Status: DC
  Administered 2020-08-18: 1.5 mg via TRANSDERMAL

## 2020-08-18 MED ORDER — FENTANYL CITRATE (PF) 100 MCG/2ML IJ SOLN
100.0000 ug | Freq: Once | INTRAMUSCULAR | Status: AC
  Administered 2020-08-18: 100 ug via INTRAVENOUS

## 2020-08-18 MED ORDER — DOCUSATE SODIUM 100 MG PO CAPS
100.0000 mg | ORAL_CAPSULE | Freq: Two times a day (BID) | ORAL | Status: DC
  Administered 2020-08-18: 100 mg via ORAL

## 2020-08-18 MED ORDER — ACETAMINOPHEN 500 MG PO TABS
1000.0000 mg | ORAL_TABLET | Freq: Four times a day (QID) | ORAL | Status: DC
  Administered 2020-08-18 (×2): 1000 mg via ORAL
  Filled 2020-08-18 (×2): qty 2

## 2020-08-18 MED ORDER — GABAPENTIN 100 MG PO CAPS
ORAL_CAPSULE | ORAL | Status: AC
  Filled 2020-08-18: qty 1

## 2020-08-18 MED ORDER — ACETAMINOPHEN 500 MG PO TABS
ORAL_TABLET | ORAL | Status: AC
  Filled 2020-08-18: qty 2

## 2020-08-18 MED ORDER — LACTATED RINGERS IV SOLN: INTRAVENOUS | Status: DC

## 2020-08-18 MED ORDER — SODIUM CHLORIDE (PF) 0.9 % IJ SOLN
INTRAMUSCULAR | Status: AC
  Filled 2020-08-18: qty 10

## 2020-08-18 MED ORDER — MIDAZOLAM HCL 2 MG/2ML IJ SOLN
INTRAMUSCULAR | Status: AC
  Filled 2020-08-18: qty 2

## 2020-08-18 MED ORDER — TRAMADOL HCL 50 MG PO TABS
50.0000 mg | ORAL_TABLET | Freq: Four times a day (QID) | ORAL | Status: DC | PRN
  Administered 2020-08-19: 50 mg via ORAL
  Filled 2020-08-18: qty 1

## 2020-08-18 MED ORDER — TRAMADOL HCL 50 MG PO TABS
100.0000 mg | ORAL_TABLET | Freq: Four times a day (QID) | ORAL | Status: DC | PRN
  Administered 2020-08-18 (×2): 100 mg via ORAL
  Filled 2020-08-18 (×2): qty 2

## 2020-08-18 MED ORDER — TECHNETIUM TC 99M SULFUR COLLOID FILTERED
1.0000 | Freq: Once | INTRAVENOUS | Status: AC | PRN
  Administered 2020-08-18: 1 via INTRADERMAL

## 2020-08-18 MED ORDER — PROPOFOL 500 MG/50ML IV EMUL
INTRAVENOUS | Status: DC | PRN
  Administered 2020-08-18: 25 ug/kg/min via INTRAVENOUS

## 2020-08-18 MED ORDER — DEXAMETHASONE SODIUM PHOSPHATE 4 MG/ML IJ SOLN
INTRAMUSCULAR | Status: DC | PRN
  Administered 2020-08-18: 10 mg via INTRAVENOUS

## 2020-08-18 MED ORDER — KETOROLAC TROMETHAMINE 15 MG/ML IJ SOLN: 15.0000 mg | Freq: Four times a day (QID) | INTRAMUSCULAR | Status: DC | PRN

## 2020-08-18 MED ORDER — PROPOFOL 10 MG/ML IV BOLUS
INTRAVENOUS | Status: DC | PRN
  Administered 2020-08-18: 200 mg via INTRAVENOUS

## 2020-08-18 MED ORDER — SUCCINYLCHOLINE CHLORIDE 200 MG/10ML IV SOSY
PREFILLED_SYRINGE | INTRAVENOUS | Status: AC
  Filled 2020-08-18: qty 10

## 2020-08-18 MED ORDER — METHOCARBAMOL 500 MG PO TABS: 500.0000 mg | ORAL_TABLET | Freq: Three times a day (TID) | ORAL | Status: DC | PRN

## 2020-08-18 MED ORDER — CEFAZOLIN SODIUM-DEXTROSE 2-4 GM/100ML-% IV SOLN
2.0000 g | INTRAVENOUS | Status: AC
  Administered 2020-08-18: 2 g via INTRAVENOUS

## 2020-08-18 MED ORDER — BUPIVACAINE HCL (PF) 0.5 % IJ SOLN
INTRAMUSCULAR | Status: AC
  Filled 2020-08-18: qty 30

## 2020-08-18 MED ORDER — KETOROLAC TROMETHAMINE 15 MG/ML IJ SOLN
15.0000 mg | INTRAMUSCULAR | Status: AC
  Administered 2020-08-18: 15 mg via INTRAVENOUS

## 2020-08-18 MED ORDER — MEPERIDINE HCL 25 MG/ML IJ SOLN: 6.2500 mg | INTRAMUSCULAR | Status: DC | PRN

## 2020-08-18 MED ORDER — ONDANSETRON HCL 4 MG/2ML IJ SOLN
INTRAMUSCULAR | Status: AC
  Filled 2020-08-18: qty 4

## 2020-08-18 MED ORDER — DEXAMETHASONE SODIUM PHOSPHATE 10 MG/ML IJ SOLN
INTRAMUSCULAR | Status: AC
  Filled 2020-08-18: qty 1

## 2020-08-18 MED ORDER — AMISULPRIDE (ANTIEMETIC) 5 MG/2ML IV SOLN: 10.0000 mg | Freq: Once | INTRAVENOUS | Status: DC | PRN

## 2020-08-18 MED ORDER — GABAPENTIN 100 MG PO CAPS
100.0000 mg | ORAL_CAPSULE | ORAL | Status: AC
  Administered 2020-08-18: 100 mg via ORAL

## 2020-08-18 MED ORDER — EPHEDRINE 5 MG/ML INJ
INTRAVENOUS | Status: AC
  Filled 2020-08-18: qty 10

## 2020-08-18 MED ORDER — ROPIVACAINE HCL 5 MG/ML IJ SOLN
INTRAMUSCULAR | Status: DC | PRN
  Administered 2020-08-18: 30 mL via PERINEURAL

## 2020-08-18 MED ORDER — KETOROLAC TROMETHAMINE 15 MG/ML IJ SOLN
INTRAMUSCULAR | Status: AC
  Filled 2020-08-18: qty 1

## 2020-08-18 MED ORDER — MIDAZOLAM HCL 5 MG/5ML IJ SOLN
INTRAMUSCULAR | Status: DC | PRN
  Administered 2020-08-18: 1 mg via INTRAVENOUS

## 2020-08-18 MED ORDER — PHENYLEPHRINE 40 MCG/ML (10ML) SYRINGE FOR IV PUSH (FOR BLOOD PRESSURE SUPPORT)
PREFILLED_SYRINGE | INTRAVENOUS | Status: AC
  Filled 2020-08-18: qty 10

## 2020-08-18 MED ORDER — BUPIVACAINE HCL (PF) 0.25 % IJ SOLN
INTRAMUSCULAR | Status: AC
  Filled 2020-08-18: qty 30

## 2020-08-18 SURGICAL SUPPLY — 99 items
ALLODERM 16X20 PERFORATED (Tissue) ×1 IMPLANT
ALLOGRAFT PERF 16X20 1.6+/-0.4 (Tissue) ×1 IMPLANT
APPLIER CLIP 11 MED OPEN (CLIP) ×3
APPLIER CLIP 9.375 MED OPEN (MISCELLANEOUS)
BAG DECANTER FOR FLEXI CONT (MISCELLANEOUS) ×3 IMPLANT
BINDER BREAST LRG (GAUZE/BANDAGES/DRESSINGS) ×2 IMPLANT
BINDER BREAST MEDIUM (GAUZE/BANDAGES/DRESSINGS) IMPLANT
BINDER BREAST XLRG (GAUZE/BANDAGES/DRESSINGS) IMPLANT
BINDER BREAST XXLRG (GAUZE/BANDAGES/DRESSINGS) IMPLANT
BIOPATCH RED 1 DISK 7.0 (GAUZE/BANDAGES/DRESSINGS) IMPLANT
BIOPATCH RED 1IN DISK 7.0MM (GAUZE/BANDAGES/DRESSINGS)
BLADE CLIPPER SURG (BLADE) IMPLANT
BLADE SURG 10 STRL SS (BLADE) ×3 IMPLANT
BLADE SURG 15 STRL LF DISP TIS (BLADE) ×1 IMPLANT
BLADE SURG 15 STRL SS (BLADE) ×9
BNDG GAUZE ELAST 4 BULKY (GAUZE/BANDAGES/DRESSINGS) ×4 IMPLANT
CANISTER SUCT 1200ML W/VALVE (MISCELLANEOUS) ×3 IMPLANT
CHLORAPREP W/TINT 26 (MISCELLANEOUS) ×3 IMPLANT
CLIP APPLIE 11 MED OPEN (CLIP) ×1 IMPLANT
CLIP APPLIE 9.375 MED OPEN (MISCELLANEOUS) IMPLANT
CLIP VESOCCLUDE SM WIDE 6/CT (CLIP) IMPLANT
CLOSURE WOUND 1/2 X4 (GAUZE/BANDAGES/DRESSINGS)
COUNTER NEEDLE 1200 MAGNETIC (NEEDLE) IMPLANT
COVER BACK TABLE 60X90IN (DRAPES) ×3 IMPLANT
COVER MAYO STAND STRL (DRAPES) ×6 IMPLANT
COVER PROBE W GEL 5X96 (DRAPES) ×3 IMPLANT
COVER WAND RF STERILE (DRAPES) IMPLANT
DECANTER SPIKE VIAL GLASS SM (MISCELLANEOUS) IMPLANT
DERMABOND ADVANCED (GAUZE/BANDAGES/DRESSINGS) ×2
DERMABOND ADVANCED .7 DNX12 (GAUZE/BANDAGES/DRESSINGS) ×1 IMPLANT
DRAIN CHANNEL 15F RND FF W/TCR (WOUND CARE) ×2 IMPLANT
DRAIN CHANNEL 19F RND (DRAIN) ×2 IMPLANT
DRAPE INCISE IOBAN 66X45 STRL (DRAPES) ×3 IMPLANT
DRAPE TOP ARMCOVERS (MISCELLANEOUS) ×3 IMPLANT
DRAPE U-SHAPE 76X120 STRL (DRAPES) ×3 IMPLANT
DRAPE UTILITY XL STRL (DRAPES) ×3 IMPLANT
DRSG PAD ABDOMINAL 8X10 ST (GAUZE/BANDAGES/DRESSINGS) ×6 IMPLANT
DRSG TEGADERM 4X10 (GAUZE/BANDAGES/DRESSINGS) ×6 IMPLANT
DRSG TEGADERM 4X4.75 (GAUZE/BANDAGES/DRESSINGS) IMPLANT
ELECT BLADE 4.0 EZ CLEAN MEGAD (MISCELLANEOUS) ×3
ELECT COATED BLADE 2.86 ST (ELECTRODE) ×3 IMPLANT
ELECT REM PT RETURN 9FT ADLT (ELECTROSURGICAL) ×3
ELECTRODE BLDE 4.0 EZ CLN MEGD (MISCELLANEOUS) ×1 IMPLANT
ELECTRODE REM PT RTRN 9FT ADLT (ELECTROSURGICAL) ×1 IMPLANT
EVACUATOR SILICONE 100CC (DRAIN) ×4 IMPLANT
EXPANDER TISSUE FV FOURTE 400 (Prosthesis & Implant Plastic) IMPLANT
GAUZE SPONGE 4X4 12PLY STRL LF (GAUZE/BANDAGES/DRESSINGS) IMPLANT
GLOVE BIO SURGEON STRL SZ 6 (GLOVE) ×6 IMPLANT
GLOVE BIO SURGEON STRL SZ7 (GLOVE) ×3 IMPLANT
GLOVE BIOGEL PI IND STRL 7.5 (GLOVE) ×1 IMPLANT
GLOVE BIOGEL PI INDICATOR 7.5 (GLOVE) ×2
GOWN STRL REUS W/ TWL LRG LVL3 (GOWN DISPOSABLE) ×4 IMPLANT
GOWN STRL REUS W/TWL LRG LVL3 (GOWN DISPOSABLE) ×12
HEMOSTAT ARISTA ABSORB 3G PWDR (HEMOSTASIS) IMPLANT
KIT FILL SYSTEM UNIVERSAL (SET/KITS/TRAYS/PACK) ×2 IMPLANT
LIGHT WAVEGUIDE WIDE FLAT (MISCELLANEOUS) ×3 IMPLANT
MARKER SKIN DUAL TIP RULER LAB (MISCELLANEOUS) IMPLANT
NDL HYPO 25X1 1.5 SAFETY (NEEDLE) IMPLANT
NDL SAFETY ECLIPSE 18X1.5 (NEEDLE) IMPLANT
NEEDLE HYPO 18GX1.5 SHARP (NEEDLE)
NEEDLE HYPO 25X1 1.5 SAFETY (NEEDLE) IMPLANT
NS IRRIG 1000ML POUR BTL (IV SOLUTION) ×5 IMPLANT
PACK BASIN DAY SURGERY FS (CUSTOM PROCEDURE TRAY) ×3 IMPLANT
PENCIL SMOKE EVACUATOR (MISCELLANEOUS) ×3 IMPLANT
PIN SAFETY STERILE (MISCELLANEOUS) ×3 IMPLANT
PUNCH BIOPSY DERMAL 4MM (MISCELLANEOUS) IMPLANT
RETRACTOR ONETRAX LX 90X20 (MISCELLANEOUS) IMPLANT
SHEET MEDIUM DRAPE 40X70 STRL (DRAPES) ×3 IMPLANT
SLEEVE SCD COMPRESS KNEE MED (MISCELLANEOUS) ×3 IMPLANT
SPONGE LAP 18X18 RF (DISPOSABLE) ×8 IMPLANT
SPONGE LAP 4X18 RFD (DISPOSABLE) IMPLANT
STAPLER VISISTAT 35W (STAPLE) ×3 IMPLANT
STRIP CLOSURE SKIN 1/2X4 (GAUZE/BANDAGES/DRESSINGS) IMPLANT
SUT CHROMIC 4 0 PS 2 18 (SUTURE) ×6 IMPLANT
SUT ETHILON 2 0 FS 18 (SUTURE) ×3 IMPLANT
SUT MNCRL AB 4-0 PS2 18 (SUTURE) ×5 IMPLANT
SUT SILK 2 0 SH (SUTURE) IMPLANT
SUT VIC AB 2-0 SH 27 (SUTURE) ×6
SUT VIC AB 2-0 SH 27XBRD (SUTURE) ×2 IMPLANT
SUT VIC AB 3-0 54X BRD REEL (SUTURE) IMPLANT
SUT VIC AB 3-0 BRD 54 (SUTURE)
SUT VIC AB 3-0 SH 27 (SUTURE) ×3
SUT VIC AB 3-0 SH 27X BRD (SUTURE) IMPLANT
SUT VICRYL 0 CT-2 (SUTURE) ×4 IMPLANT
SUT VICRYL 3-0 CR8 SH (SUTURE) ×3 IMPLANT
SUT VICRYL 4-0 PS2 18IN ABS (SUTURE) ×2 IMPLANT
SUT VLOC 180 0 24IN GS25 (SUTURE) ×2 IMPLANT
SYR 50ML LL SCALE MARK (SYRINGE) ×3 IMPLANT
SYR BULB IRRIG 60ML STRL (SYRINGE) ×3 IMPLANT
SYR CONTROL 10ML LL (SYRINGE) IMPLANT
TAPE MEASURE VINYL STERILE (MISCELLANEOUS) ×2 IMPLANT
TISSUE EXPNDR FV FOURTE 400 (Prosthesis & Implant Plastic) ×3 IMPLANT
TOWEL GREEN STERILE FF (TOWEL DISPOSABLE) ×6 IMPLANT
TRAY DSU PREP LF (CUSTOM PROCEDURE TRAY) ×3 IMPLANT
TRAY FOLEY W/BAG SLVR 14FR LF (SET/KITS/TRAYS/PACK) IMPLANT
TUBE CONNECTING 20'X1/4 (TUBING) ×2
TUBE CONNECTING 20X1/4 (TUBING) ×3 IMPLANT
UNDERPAD 30X36 HEAVY ABSORB (UNDERPADS AND DIAPERS) ×6 IMPLANT
YANKAUER SUCT BULB TIP NO VENT (SUCTIONS) ×3 IMPLANT

## 2020-08-18 NOTE — H&P (Signed)
Tracy Guerra is an 44 y.o. female.   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 she has fh in her mom at age 24 of dcis treated with lump/rad. she also has history of ovarian cancer in a paternal cousin deceased around 71. she has genetic testing through Dr Helane Rima that is negative she had no mass or dc. she is on synthroid. she has some history breast pain related to caffeine. she underwent screening mm that shows c density breasts. this shows a 5.8 cm area of calcifications in the left uoq. 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 the anterior clip has migrated. since last visit she had possible pregnancy which is not viable. she eventually has undergone mr biopsy times two. 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 the span of dcis and what I think is small area of idc in this is 10x3.4 cm. she has elected to proceed with left nsm and immediate expander reconstruction.  Past Medical History:  Diagnosis Date  . Complication of anesthesia    slow to wake  . Hypothyroidism     Past Surgical History:  Procedure Laterality Date  . NASAL SEPTUM SURGERY    . vaginal polyp removed      Family History  Problem Relation Age of Onset  . Breast cancer Mother 63   Social History:  reports that she has never smoked. She has never used smokeless tobacco. She reports that she does not drink alcohol and does not use drugs.  Allergies:  Allergies  Allergen Reactions  . Avelox [Moxifloxacin Hcl In Nacl] Hives    Medications Prior to Admission  Medication Sig Dispense Refill  . Cholecalciferol (VITAMIN D) 2000 UNITS CAPS Take 1 capsule by mouth daily.      . fluticasone (FLONASE) 50 MCG/ACT nasal spray Place into the nose.    . Multiple  Vitamins-Minerals (ZINC PO) Take by mouth.    . Omega-3 Fatty Acids (FISH OIL PO) Take by mouth.    . Selenium 100 MCG CAPS Take 1 capsule (100 mcg total) by mouth daily.  0  . tamoxifen (NOLVADEX) 20 MG tablet Take 1 tablet (20 mg total) by mouth daily. 90 tablet 3  . Docosahexaenoic Acid (DHA COMPLETE PO) Take 1 capsule by mouth daily.      Marland Kitchen levothyroxine (SYNTHROID) 25 MCG tablet Take 1-2 tablets (25-50 mcg total) by mouth daily. Take 25 mcg 2 times a week and 50 mcg twice a week    . nystatin-triamcinolone (MYCOLOG II) cream APPLY TO THE AFFECTED AREA TWICE DAILY AS NEEDED FOR MORNING AND EVENING      Results for orders placed or performed during the hospital encounter of 08/17/20 (from the past 48 hour(s))  SARS CORONAVIRUS 2 (TAT 6-24 HRS) Nasopharyngeal Nasopharyngeal Swab     Status: None   Collection Time: 08/17/20  2:24 PM   Specimen: Nasopharyngeal Swab  Result Value Ref Range   SARS Coronavirus 2 NEGATIVE NEGATIVE    Comment: (NOTE) SARS-CoV-2 target nucleic acids are NOT DETECTED.  The SARS-CoV-2 RNA is generally detectable in upper and lower respiratory specimens during the acute phase of infection. Negative results do not preclude SARS-CoV-2 infection, do not rule out co-infections with other pathogens, and  should not be used as the sole basis for treatment or other patient management decisions. Negative results must be combined with clinical observations, patient history, and epidemiological information. The expected result is Negative.  Fact Sheet for Patients: SugarRoll.be  Fact Sheet for Healthcare Providers: https://www.woods-mathews.com/  This test is not yet approved or cleared by the [place] FDA and  has been authorized for detection and/or diagnosis of SARS-CoV-2 by FDA under an Emergency Use Authorization (EUA). This EUA will remain  in effect (meaning this test can be used) for the duration of the COVID-19  declaration under Se ction 564(b)(1) of the Act, 21 U.S.C. section 360bbb-3(b)(1), unless the authorization is terminated or revoked sooner.  Performed at [facility], [place] [address], [place], [place] [address]    No results found.  Review of Systems  All other systems reviewed and are negative.   Height 5' 4.5" (1.638 m), weight 63.5 kg, unknown if currently breastfeeding. Physical Exam  Breast Note: no left breast mass, no axillary adenopathy No right breast mass  Assessment/Plan CARCINOMA OF UPPER-OUTER QUADRANT OF LEFT FEMALE BREAST (C50.412) Story: Left NSM with left axillary sn biopsy, immediate expander reconstruction She will stop tamoxifen 7 days prior to surgery discussed again sn biopsy indication. discussed might be via im incision or separate incision. she will get sozo measurements prior to surgery. discussed risk of lymphedema postop. discussed nsm again with risks including asensate, tissue loss, pos nipple biopsy that would mean excising this once final path back. other therapies based on final results of surgery. overnight stay, covid test preop. discussed that we are doing at day surgery and should be able to proceed even if hospital is full.   Rolm Bookbinder, MD 08/18/2020, 6:40 AM

## 2020-08-18 NOTE — Anesthesia Procedure Notes (Signed)
Procedure Name: LMA Insertion Date/Time: 08/18/2020 8:42 AM Performed by: Willa Frater, CRNA Pre-anesthesia Checklist: Patient identified, Emergency Drugs available, Suction available and Patient being monitored Patient Re-evaluated:Patient Re-evaluated prior to induction Oxygen Delivery Method: Circle system utilized Preoxygenation: Pre-oxygenation with 100% oxygen Induction Type: IV induction Ventilation: Mask ventilation without difficulty LMA: LMA inserted LMA Size: 4.0 Number of attempts: 1 Airway Equipment and Method: Bite block Placement Confirmation: positive ETCO2 Tube secured with: Tape Dental Injury: Teeth and Oropharynx as per pre-operative assessment

## 2020-08-18 NOTE — Progress Notes (Signed)
Oral airway present on arrival but removed within 1 min with CRNA at bedside.

## 2020-08-18 NOTE — Transfer of Care (Signed)
Immediate Anesthesia Transfer of Care Note  Patient: Tracy Guerra  Procedure(s) Performed: LEFT BREAST RECONSTRUCTION WITH PLACEMENT OF TISSUE EXPANDER AND ALLODERM (Left Breast) LEFT NIPPLE SPARING MASTECTOMY WITH SENTINEL LYMPH NODE BIOPSY (Left Breast)  Patient Location: PACU  Anesthesia Type:GA combined with regional for post-op pain  Level of Consciousness: sedated  Airway & Oxygen Therapy: Patient Spontanous Breathing and Patient connected to face mask oxygen  Post-op Assessment: Report given to RN and Post -op Vital signs reviewed and stable  Post vital signs: Reviewed and stable  Last Vitals:  Vitals Value Taken Time  BP    Temp    Pulse 64 08/18/20 1118  Resp 0 08/18/20 1118  SpO2 100 % 08/18/20 1118  Vitals shown include unvalidated device data.  Last Pain:  Vitals:   08/18/20 0704  TempSrc: Oral  PainSc: 0-No pain         Complications: No complications documented.

## 2020-08-18 NOTE — Progress Notes (Signed)
Nuc med inj performed by [facility] med staff. Pt tol well with no additional sedation required. VSS (see flowsheet). Emotional support provided

## 2020-08-18 NOTE — Interval H&P Note (Signed)
History and Physical Interval Note:  08/18/2020 7:44 AM  Tracy Guerra  has presented today for surgery, with the diagnosis of LEFT BREAST CANCER, UPPER OUTER QUADRANT ESTROGEN RECEPTOR IS POSITIVE.  The various methods of treatment have been discussed with the patient and family. After consideration of risks, benefits and other options for treatment, the patient has consented to  Procedure(s) with comments: LEFT BREAST RECONSTRUCTION WITH PLACEMENT OF TISSUE EXPANDER AND ALLODERM (Left) LEFT NIPPLE SPARING MASTECTOMY WITH SENTINEL LYMPH NODE BIOPSY (Left) - RNFA, PEC BLOCK as a surgical intervention.  The patient's history has been reviewed, patient examined, no change in status, stable for surgery.  I have reviewed the patient's chart and labs.  Questions were answered to the patient's satisfaction.     Arnoldo Hooker Rami Budhu

## 2020-08-18 NOTE — Anesthesia Preprocedure Evaluation (Signed)
Anesthesia Evaluation  Patient identified by MRN, date of birth, ID band Patient awake    Reviewed: Allergy & Precautions, H&P , NPO status , Patient's Chart, lab work & pertinent test results, reviewed documented beta blocker date and time   History of Anesthesia Complications Negative for: history of anesthetic complications  Airway Mallampati: I  TM Distance: >3 FB Neck ROM: full    Dental  (+) Teeth Intact   Pulmonary neg pulmonary ROS,    breath sounds clear to auscultation       Cardiovascular negative cardio ROS   Rhythm:regular Rate:Normal     Neuro/Psych negative neurological ROS  negative psych ROS   GI/Hepatic negative GI ROS, Neg liver ROS,   Endo/Other  Hypothyroidism   Renal/GU negative Renal ROS     Musculoskeletal   Abdominal   Peds  Hematology negative hematology ROS (+)   Anesthesia Other Findings Breast Cancer  Reproductive/Obstetrics                             Anesthesia Physical  Anesthesia Plan  ASA: III  Anesthesia Plan: General   Post-op Pain Management:  Regional for Post-op pain   Induction: Intravenous  PONV Risk Score and Plan: 3 and Ondansetron, Dexamethasone, Midazolam and Treatment may vary due to age or medical condition  Airway Management Planned: LMA and Oral ETT  Additional Equipment:   Intra-op Plan:   Post-operative Plan: Extubation in OR  Informed Consent: I have reviewed the patients History and Physical, chart, labs and discussed the procedure including the risks, benefits and alternatives for the proposed anesthesia with the patient or authorized representative who has indicated his/her understanding and acceptance.     Dental advisory given  Plan Discussed with: CRNA  Anesthesia Plan Comments:         Anesthesia Quick Evaluation

## 2020-08-18 NOTE — Anesthesia Postprocedure Evaluation (Signed)
Anesthesia Post Note  Patient: Ahlam Piscitelli  Procedure(s) Performed: LEFT BREAST RECONSTRUCTION WITH PLACEMENT OF TISSUE EXPANDER AND ALLODERM (Left Breast) LEFT NIPPLE SPARING MASTECTOMY WITH SENTINEL LYMPH NODE BIOPSY (Left Breast)     Patient location during evaluation: PACU Anesthesia Type: General Level of consciousness: awake and alert Pain management: pain level controlled Vital Signs Assessment: post-procedure vital signs reviewed and stable Respiratory status: spontaneous breathing, nonlabored ventilation and respiratory function stable Cardiovascular status: blood pressure returned to baseline and stable Postop Assessment: no apparent nausea or vomiting Anesthetic complications: no   No complications documented.  Last Vitals:  Vitals:   08/18/20 1215 08/18/20 1218  BP: 94/64   Pulse: 61 64  Resp: 15 14  Temp:    SpO2: 100% 100%    Last Pain:  Vitals:   08/18/20 1215  TempSrc:   PainSc: 0-No pain                 Lynda Rainwater

## 2020-08-18 NOTE — Anesthesia Procedure Notes (Signed)
Anesthesia Regional Block: Pectoralis block   Pre-Anesthetic Checklist: ,, timeout performed, Correct Patient, Correct Site, Correct Laterality, Correct Procedure, Correct Position, site marked, Risks and benefits discussed,  Surgical consent,  Pre-op evaluation,  At surgeon's request and post-op pain management  Laterality: Left  Prep: chloraprep       Needles:  Injection technique: Single-shot  Needle Type: Stimiplex     Needle Length: 9cm  Needle Gauge: 21     Additional Needles:   Procedures:,,,, ultrasound used (permanent image in chart),,,,  Narrative:  Start time: 08/18/2020 8:07 AM End time: 08/18/2020 8:12 AM Injection made incrementally with aspirations every 5 mL.  Performed by: Personally  Anesthesiologist: Lynda Rainwater, MD

## 2020-08-18 NOTE — Op Note (Signed)
Preoperative diagnosis: Stage I left breast cancer Postoperative diagnosis: Same as above Procedure: 1.  Left nipple sparing mastectomy 2.  Left deep axillary sentinel lymph node biopsy Surgeon: Dr. Serita Grammes Assistant: Carlena Hurl, PA-C Estimated blood loss: 30 cc Drains: Per plastic surgery Specimens: 1.  Left breast tissue marked short superior, long lateral, double stitch marks nipple areolar complex 2.  Left nipple biopsy 3.  Left deep axillary sentinel lymph nodes with highest count of 50-27 Complications: None Special count was correct completion Disposition recovery stable condition  Indications:44 yof who presents with new left breast dcis. she has fh in her mom at age 68 of dcis treated with lump/rad. she also has history of ovarian cancer in a paternal cousin deceased around 5. she has genetic testing through Dr Helane Rima that is negative she had no mass or dc. she underwent screening mm that shows c density breasts. this shows a 5.8 cm area of calcifications in the left uoq. 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 the anterior clip has migrated. since last visit she had possible pregnancy which is not viable. she eventually has undergone mr biopsy times two. 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 the span of dcis and what I think is small area of idc in this is 10x3.4 cm. she has elected to proceed with left nsm and immediate expander reconstruction.  Procedure: After informed consent was obtained the patient first underwent a pectoral block as well as injection of technetium in the standard periareolar fashion.  She was given cefazolin.  SCDs were in place.  She was given Toradol, Tylenol, and gabapentin.  She was then placed under general anesthesia without complication.  She was prepped and draped in the standard sterile surgical fashion.  A surgical timeout was  then performed.  I marked out an incision that began 9 cm from the sternum.  I carried this in the inframammary fold 9 cm for its length.  I dissected down to the muscle.  I then did the posterior flap first and remove the pectoralis fashion from the pectoralis muscle to the parasternal region, latissimus, and clavicle superiorly.  I then created the anterior flap with a combination of the facelift scissors and cautery.  I did this to the same landmarks.  The breast tissue was then removed.  There was no injury to the nipple areola or the skin.  I then marked it as above and passed off the table.  I then inverted the nipple and removed all of the tissue inside the nipple with a 15 blade.  This was passed off the table as a second specimen.  I then elected to make a separate incision to do the sentinel lymph node.  I made a 2-1/2 cm incision below her axillary hairline.  I dissected through the axillary fascia.  There were several small normal-appearing nodes.  The highest count was listed as above.  The background radioactivity was about 10.  I then obtained hemostasis.  I closed the axillary incision with 2-0 Vicryl, 3-0 Vicryl, and 4-0 Monocryl.  The case was then turned over to plastic surgery for reconstruction.

## 2020-08-18 NOTE — Op Note (Signed)
Operative Note   DATE OF OPERATION: 9.1.21  LOCATION: [facility]-observation  SURGICAL DIVISION: Plastic Surgery  PREOPERATIVE DIAGNOSES:  1. Left breast cancer UOQ ER+  POSTOPERATIVE DIAGNOSES:  same  PROCEDURE:  1. Left breast reconstruction with tissue expander 2. Acellular dermis (Alloderm) to left chest 300 cm2  SURGEON: Irene Limbo MD MBA  ASSISTANT: none  ANESTHESIA:  General.   EBL: 35 ml for entire procedure  COMPLICATIONS: None immediate.   INDICATIONS FOR PROCEDURE:  The patient, Tracy Guerra, is a 44 y.o. female born on 1976-08-31, is here for immediate prepectoral expander ADM reconstruction following left nipple sparing mastectomy.   FINDINGS: Natrelle 133S FV-12 T-400 ml tissue expander placed, initial fill volume 300 ml air. SN [id number]  DESCRIPTION OF PROCEDURE:  The patient's operative site was marked with the patient in the preoperative area. The patient was taken to the operating room. SCDs were placed and IV antibiotics were given. The patient's operative site was prepped and draped in a sterile fashion. A time out was performed and all information was confirmed to be correct.   Following completion of mastectomy, the cavity was irrigated with salinesolution containing Ancef, gentamicin, and bacitracin. Hemostasis was ensured.A 19 Fr drain was placed in subcutaneous position laterally and a 15 Fr drain placed along inframammary fold. Each secured to skin with 2-0 nylon. Cavity irrigated with Betadinesaline solution. The tissue expanders were prepared on back table prior in insertion. The expander was filled with air.Perforated acellular dermiswasdraped over anterior surface expander. The ADM was then secured to itself over posterior surface of expanderwith 4-0 chromic. Redundant folds acellular dermis excised so that the ADM layflat without folds over air filled expander.The expander was secured tofascia over lateral sternal borderwith a 0  vicryl. Thelateral tab wasalso secured to pectoralis muscle with 0-vicryl. The ADM was secured to pectoralis muscle and chest wall along inferior border at inframammary foldwith 0 V-lock suture.Laterally the mastectomy flap over posterior axillary line was advanced anteriorly and the subcutaneous tissue and superficial fascia was secured to pectoralisand serratusmuscleswith 0-vicryl. Skin closure completedwith 3-0 vicryl in fascial layer and 4-0 vicryl in dermis. Skin closure completed with 4-0 monocryl subcuticular and tissue adhesive. Patient brought to sitting position.The mastectomy flaps were redrapedto position NAC appropriately over expander. Patient returned to supine position.Tegaderm dressings applied followed bydry dressing,breast binder.  The patient was allowed to wake from anesthesia, extubated and taken to the recovery room in satisfactory condition.   SPECIMENS: none  DRAINS: 15 and 19 Fr JP in left breast reconstruction

## 2020-08-18 NOTE — Progress Notes (Signed)
Assisted Dr. Miller with left, ultrasound guided, pectoralis block. Side rails up, monitors on throughout procedure. See vital signs in flow sheet. Tolerated Procedure well. 

## 2020-08-19 ENCOUNTER — Encounter (HOSPITAL_BASED_OUTPATIENT_CLINIC_OR_DEPARTMENT_OTHER): Payer: Self-pay | Admitting: Plastic Surgery

## 2020-08-19 ENCOUNTER — Encounter: Payer: Self-pay | Admitting: General Practice

## 2020-08-19 DIAGNOSIS — C50412 Malignant neoplasm of upper-outer quadrant of left female breast: Secondary | ICD-10-CM | POA: Diagnosis not present

## 2020-08-19 NOTE — Discharge Instructions (Signed)
[facility], [place] [phone]  MASTECTOMY: POST OP INSTRUCTIONS Take 400 mg of ibuprofen every 8 hours or 650 mg tylenol every 6 hours for next 72 hours then as needed. Use ice several times daily also. Always review your discharge instruction sheet given to you by the facility where your surgery was performed. IF YOU HAVE DISABILITY OR FAMILY LEAVE FORMS, YOU MUST BRING THEM TO THE OFFICE FOR PROCESSING.   DO NOT GIVE THEM TO YOUR DOCTOR. A prescription for pain medication may be given to you upon discharge.  Take your pain medication as prescribed, if needed.  If narcotic pain medicine is not needed, then you may take acetaminophen (Tylenol), naprosyn (Alleve) or ibuprofen (Advil) as needed. 1. Take your usually prescribed medications unless otherwise directed. 2. If you need a refill on your pain medication, please contact your pharmacy.  They will contact our office to request authorization.  Prescriptions will not be filled after 5pm or on week-ends. 3. You should follow a light diet the first few days after arrival home, such as soup and crackers, etc.  Resume your normal diet the day after surgery. 4. Most patients will experience some swelling and bruising on the chest and underarm.  Ice packs will help.  Swelling and bruising can take several days to resolve. Wear the binder day and night until you return to the office.  5. It is common to experience some constipation if taking pain medication after surgery.  Increasing fluid intake and taking a stool softener (such as Colace) will usually help or prevent this problem from occurring.  A mild laxative (Milk of Magnesia or Miralax) should be taken according to package instructions if there are no bowel movements after 48 hours. 6. Unless discharge instructions indicate otherwise, leave your bandage dry and in place until your next appointment in 3-5 days.  You may take a limited sponge bath.  No tube baths or showers until the  drains are removed.  You may have steri-strips (small skin tapes) in place directly over the incision.  These strips should be left on the skin for 7-10 days. If you have glue it will come off in next couple week.  Any sutures will be removed at an office visit 7. DRAINS:  If you have drains in place, it is important to keep a list of the amount of drainage produced each day in your drains.  Before leaving the hospital, you should be instructed on drain care.  Call our office if you have any questions about your drains. I will remove your drains when they put out less than 30 cc or ml for 2 consecutive days. 8. ACTIVITIES:  You may resume regular (light) daily activities beginning the next day--such as daily self-care, walking, climbing stairs--gradually increasing activities as tolerated.  You may have sexual intercourse when it is comfortable.  Refrain from any heavy lifting or straining until approved by your doctor. a. You may drive when you are no longer taking prescription pain medication, you can comfortably wear a seatbelt, and you can safely maneuver your car and apply brakes. b. RETURN TO WORK:  __________________________________________________________ 9. You should see your doctor in the office for a follow-up appointment approximately 3-5 days after your surgery.  Your doctor's nurse will typically make your follow-up appointment when she calls you with your pathology report.  Expect your pathology report 3-4business days after surgery. 10. OTHER INSTRUCTIONS: ______________________________________________________________________________________________ ____________________________________________________________________________________________ WHEN TO CALL YOUR DR [place]: 1. Fever over 101.0 2.  Nausea and/or vomiting 3. Extreme swelling or bruising 4. Continued bleeding from incision. 5. Increased pain, redness, or drainage from the incision. The clinic staff is available to answer your  questions during regular business hours.  Please don't hesitate to call and ask to speak to one of the nurses for clinical concerns.  If you have a medical emergency, go to the nearest emergency room or call 911.  A surgeon from [facility] is always on call at the hospital. [address], [place], [place], [place]  [address] ? [address], [place], [place]   [address] [phone] ? [phone] ? FAX [phone] Web site: [email or web address]   Tramadol taken at 5:06am  About my Jackson-Pratt Bulb Drain  What is a Jackson-Pratt bulb? A Jackson-Pratt is a soft, round device used to collect drainage. It is connected to a long, thin drainage catheter, which is held in place by one or two small stiches near your surgical incision site. When the bulb is squeezed, it forms a vacuum, forcing the drainage to empty into the bulb.  Emptying the Jackson-Pratt bulb- To empty the bulb: 1. Release the plug on the top of the bulb. 2. Pour the bulb's contents into a measuring container which your nurse will provide. 3. Record the time emptied and amount of drainage. Empty the drain(s) as often as your     doctor or nurse recommends.  Date                  Time                    Amount (Drain 1)                 Amount (Drain 2)  _____________________________________________________________________  _____________________________________________________________________  _____________________________________________________________________  _____________________________________________________________________  _____________________________________________________________________  _____________________________________________________________________  _____________________________________________________________________  _____________________________________________________________________  Squeezing the Jackson-Pratt Bulb- To squeeze the bulb: 1. Make sure the  plug at the top of the bulb is open. 2. Squeeze the bulb tightly in your fist. You will hear air squeezing from the bulb. 3. Replace the plug while the bulb is squeezed. 4. Use a safety pin to attach the bulb to your clothing. This will keep the catheter from     pulling at the bulb insertion site.  When to call your doctor- Call your doctor if:  Drain site becomes red, swollen or hot.  You have a fever greater than 101 degrees F.  There is oozing at the drain site.  Drain falls out (apply a guaze bandage over the drain hole and secure it with tape).  Drainage increases daily not related to activity patterns. (You will usually have more drainage when you are active than when you are resting.)  Drainage has a bad odor.

## 2020-08-19 NOTE — Progress Notes (Signed)
Patient ID: Tracy Guerra, female   DOB: Apr 11, 1976, 44 y.o.   MRN: [id number] Doing great following am, will dc home

## 2020-08-19 NOTE — Discharge Summary (Signed)
Physician Discharge Summary  Patient ID: Tracy Guerra MRN: [id number] DOB/AGE: 46-Jun-1977 46 y.o.  Admit date: 08/18/2020 Discharge date: 08/19/2020  Admission Diagnoses: Left breast cancer UOQ ER+  Discharge Diagnoses:  same  Discharged Condition: stable  Hospital Course: Post operatively patient did well with no nausea, pain controlled with oral medication, ambulatory with minimal assist and tolerating diet. Instructed on bathing and dressings.  Treatments: surgery: left nipple sparing mastectomy sentinel node biopsy left breast reconstruction with tissue expander acelluar dermis 9.1.21  Discharge Exam: Blood pressure 107/67, pulse 79, temperature 98 F (36.7 C), resp. rate 16, height 5' 4.5" (1.638 m), weight 63.7 kg, last menstrual period 07/29/2020, SpO2 100 %, unknown if currently breastfeeding. Incision/Wound:  Chest left incisions intact dry chest soft Tegaderms in place skin flap viable JPs serosanguinous  Disposition: Discharge disposition: 01-Home or Self Care       Discharge Instructions     Call MD for:  redness, tenderness, or signs of infection (pain, swelling, bleeding, redness, odor or green/yellow discharge around incision site)   Complete by: As directed    Call MD for:  temperature >100.5   Complete by: As directed    Discharge instructions   Complete by: As directed    Ok to remove dressings and shower am 9.3.21 Soap and water ok, pat Tegaderms dry. Do not remove Tegaderms. No creams or ointments over incisions. Do not let drains dangle in shower, attach to lanyard or similar.Strip and record drains twice daily and bring log to clinic visit.  Breast binder or soft compression bra all other times.  Ok to raise arms above shoulders for bathing and dressing.  No house yard work or exercise until cleared by MD.   Recommend ibuprofen with meals to aid with pain control. Also ok to use Tylenol for pain. Ice packs to chest as desired for comfort. Recommend  Miralax or Dulcolax as needed for constipation. Patient received all Rx preop.   Driving Restrictions   Complete by: As directed    No driving for 2 weeks then no driving if taking prescription pain medication.   Lifting restrictions   Complete by: As directed    No lifting > 5 lbs until cleared by MD   Resume previous diet   Complete by: As directed       Allergies as of 08/19/2020       Reactions   Avelox [moxifloxacin Hcl In Nacl] Hives        Medication List     TAKE these medications    DHA COMPLETE PO Take 1 capsule by mouth daily.   FISH OIL PO Take by mouth.   fluticasone 50 MCG/ACT nasal spray Commonly known as: FLONASE Place into the nose.   levothyroxine 25 MCG tablet Commonly known as: SYNTHROID Take 1-2 tablets (25-50 mcg total) by mouth daily. Take 25 mcg 2 times a week and 50 mcg twice a week   nystatin-triamcinolone cream Commonly known as: MYCOLOG II APPLY TO THE AFFECTED AREA TWICE DAILY AS NEEDED FOR MORNING AND EVENING   Selenium 100 MCG Caps Take 1 capsule (100 mcg total) by mouth daily.   tamoxifen 20 MG tablet Commonly known as: NOLVADEX Take 1 tablet (20 mg total) by mouth daily.   Vitamin D 50 MCG (2000 UT) Caps Take 1 capsule by mouth daily.   ZINC PO Take by mouth.        Follow-up Information     Rolm Bookbinder, MD In 2 weeks.  Specialty: General Surgery Contact information: [address] [place] [address] [phone]         Irene Limbo, MD In 1 week.   Specialty: Plastic Surgery Why: as scheduled Contact information: [place] [address] [place]  [address] [phone]                 Signed: Irene Limbo 08/19/2020, 7:07 AM

## 2020-08-19 NOTE — Progress Notes (Signed)
[facility] Note  Received and returned follow-up call from [place]. Left voicemail encouraging callback when she feels able.   [place], [place], [facility] Pager [phone] Voicemail [phone]

## 2020-08-20 ENCOUNTER — Encounter: Payer: Self-pay | Admitting: General Practice

## 2020-08-20 LAB — SURGICAL PATHOLOGY

## 2020-08-20 NOTE — Progress Notes (Signed)
[place] Note  Met with Tracy Guerra for an hourlong session in my office. She is working hard to understand and make meaning of her cancer experience from spiritual and religious perspectives, including Jainism and Christianity. Brainstormed resources and shared books that might complement her journey. We plan to keep in touch as she explores ways to integrate both faith backgrounds into her personal and family practices while coping with her recovery and treatment plan from here.   [place], [place], [facility] Pager [phone] Voicemail [phone]

## 2020-08-24 ENCOUNTER — Encounter: Payer: Self-pay | Admitting: *Deleted

## 2020-08-25 NOTE — Progress Notes (Signed)
Patient Care Team: Reynold Bowen, MD as PCP - General (Endocrinology) Mauro Kaufmann, RN as Oncology Nurse Navigator Rockwell Germany, RN as Oncology Nurse Navigator  DIAGNOSIS:    ICD-10-CM   1. Malignant neoplasm of upper-outer quadrant of left breast in female, estrogen receptor positive ([place])  C50.412 MM DIAG BREAST TOMO UNI RIGHT   Z17.0     SUMMARY OF ONCOLOGIC HISTORY: Oncology History  Malignant neoplasm of upper-outer quadrant of left breast in female, estrogen receptor positive ([place])  03/02/2020 Initial Diagnosis   Screening mammogram revealed left breast calcifications 5.8 cm overall.  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    03/28/2020 Breast MRI   two sites of biopsy proven disease in the upper outer left breast with subtle non-mass enhancement extending between them suspicious for invasive disease.  Additional 1 cm nodule between the 2 biopsy sites.  Total span 10 cm   05/19/2020 Initial Biopsy   Left breast biopsy UOQ: Grade 3 IDC with DCIS, LOQ: DCIS with calcifications and necrosis, invasive cancer ER 95%, PR 80%, Ki-67 30%, HER-2 negative (1+)   05/24/2020 Cancer Staging   Staging form: Breast, AJCC 8th Edition - Clinical: Stage IA (cT1b, cN0, cM0, G3, ER+, PR+, HER2-) - Signed by Nicholas Lose, MD on 05/24/2020   08/18/2020 Surgery   Left mastectomy with recontruction Don[facility]el & [name]): 1 cm IDC, grade 3, with high grade DCIS, clear margins, 3 left axillary lymph nodes negative for carcinoma.     CHIEF COMPLIANT: Follow-up s/p left mastectomy   INTERVAL HISTORY: Tracy Guerra is a 44 y.o. with above-mentioned history of left breast DCIS currently on neoadjuvant antiestrogen therapy with tamoxifen. She underwent a left mastectomy with recontruction on 08/18/20 with Dr. Donne Hazel and Dr. Iran Planas for which pathology showed invasive ductal carcinoma, grade 3, with high grade DCIS, clear margins, 3 left axillary lymph nodes negative for  carcinoma. She presents to the clinic today to discuss the pathology report and treatment plan.    ALLERGIES:  is allergic to avelox [moxifloxacin hcl in nacl].  MEDICATIONS:  Current Outpatient Medications  Medication Sig Dispense Refill  . Cholecalciferol (VITAMIN D) 2000 UNITS CAPS Take 1 capsule by mouth daily.      . Docosahexaenoic Acid (DHA COMPLETE PO) Take 1 capsule by mouth daily.      . fluticasone (FLONASE) 50 MCG/ACT nasal spray Place into the nose.    . levothyroxine (SYNTHROID) 25 MCG tablet Take 1-2 tablets (25-50 mcg total) by mouth daily. Take 25 mcg 2 times a week and 50 mcg twice a week    . Multiple Vitamins-Minerals (ZINC PO) Take by mouth.    . nystatin-triamcinolone (MYCOLOG II) cream APPLY TO THE AFFECTED AREA TWICE DAILY AS NEEDED FOR MORNING AND EVENING    . Omega-3 Fatty Acids (FISH OIL PO) Take by mouth.    . Selenium 100 MCG CAPS Take 1 capsule (100 mcg total) by mouth daily.  0  . tamoxifen (NOLVADEX) 20 MG tablet Take 1 tablet (20 mg total) by mouth daily. 90 tablet 3   No current facility-administered medications for this visit.    PHYSICAL EXAMINATION: ECOG PERFORMANCE STATUS: 1 - Symptomatic but completely ambulatory  Vitals:   08/26/20 1529  BP: 110/74  Pulse: 76  Resp: 18  Temp: 98 F (36.7 C)  SpO2: 100%   Filed Weights   08/26/20 1529  Weight: 144 lb 1.6 oz (65.4 kg)    LABORATORY DATA:  I have  reviewed the data as listed No flowsheet data found.  Lab Results  Component Value Date   WBC 6.4 09/07/2018   HGB 12.7 09/07/2018   HCT 38.2 09/07/2018   MCV 97.0 09/07/2018   PLT 245 09/07/2018   NEUTROABS 3.6 09/07/2018    ASSESSMENT & PLAN:  Malignant neoplasm of upper-outer quadrant of left breast in female, estrogen receptor positive ([place]) 08/18/2020:Left mastectomy with recontruction [facility]el & [name]): 1 cm IDC, grade 3, with high grade DCIS, clear margins, 3 left axillary lymph nodes negative for carcinoma.  ER 95%, PR 80%,  HER-2 negative, Ki-67 30% Oncotype DX recurrence score: 22: Distant recurrence at 9 years: 8%  Pathology counseling: I discussed the final pathology report of the patient provided  a copy of this report. I discussed the margins as well as lymph node surgeries. We also discussed the final staging along with previously performed ER/PR and HER-2/neu testing.  Treatment plan: 1.  Given the Oncotype score is 22, after discussing the risks and benefits we decided that she would not receive chemotherapy. 2. no role of adjuvant radiation since she had a mastectomy with clean margins. 3.  Continue with antiestrogen therapy with tamoxifen x10 years: Currently at 10 mg daily  Tamoxifen toxicities: Hot flashes Mood swings Muscle stiffness initially   Return to clinic in 6 months for follow-up.    Orders Placed This Encounter  Procedures  . MM DIAG BREAST TOMO UNI RIGHT    Standing Status:   Future    Standing Expiration Date:   08/26/2021    Order Specific Question:   Reason for Exam (SYMPTOM  OR DIAGNOSIS REQUIRED)    Answer:   Annual mammogram with H/O left breast cancer S/P mastectomy    Order Specific Question:   Is the patient pregnant?    Answer:   No    Order Specific Question:   Preferred imaging location?    Answer:   [facility]   The patient has a good understanding of the overall plan. she agrees with it. she will call with any problems that may develop before the next visit here.  Total time spent: 30 mins including face to face time and time spent for planning, charting and coordination of care  Nicholas Lose, MD 08/26/2020  I, Cloyde Reams Dorshimer, am acting as scribe for Dr. Nicholas Lose.  I have reviewed the above documentation for accuracy and completeness, and I agree with the above.

## 2020-08-26 ENCOUNTER — Encounter: Payer: Self-pay | Admitting: *Deleted

## 2020-08-26 ENCOUNTER — Other Ambulatory Visit: Payer: Self-pay

## 2020-08-26 ENCOUNTER — Inpatient Hospital Stay: Payer: PRIVATE HEALTH INSURANCE | Attending: Hematology and Oncology | Admitting: Hematology and Oncology

## 2020-08-26 DIAGNOSIS — C50412 Malignant neoplasm of upper-outer quadrant of left female breast: Secondary | ICD-10-CM | POA: Diagnosis not present

## 2020-08-26 DIAGNOSIS — Z9012 Acquired absence of left breast and nipple: Secondary | ICD-10-CM | POA: Diagnosis not present

## 2020-08-26 DIAGNOSIS — Z79810 Long term (current) use of selective estrogen receptor modulators (SERMs): Secondary | ICD-10-CM | POA: Diagnosis not present

## 2020-08-26 DIAGNOSIS — D0512 Intraductal carcinoma in situ of left breast: Secondary | ICD-10-CM | POA: Diagnosis present

## 2020-08-26 DIAGNOSIS — Z170 Estrogen receptor positive status [ER+]: Secondary | ICD-10-CM | POA: Diagnosis not present

## 2020-08-26 NOTE — Assessment & Plan Note (Signed)
08/18/2020:Left mastectomy with recontruction [facility] [name]): 1 cm IDC, grade 3, with high grade DCIS, clear margins, 3 left axillary lymph nodes negative for carcinoma.  ER 95%, PR 80%, HER-2 negative, Ki-67 30% Oncotype DX recurrence score: 22: Distant recurrence at 9 years: 8%  Pathology counseling: I discussed the final pathology report of the patient provided  a copy of this report. I discussed the margins as well as lymph node surgeries. We also discussed the final staging along with previously performed ER/PR and HER-2/neu testing.  Treatment plan: 1.  Given the Oncotype score is 22, after discussing the risks and benefits we decided that she would not receive chemotherapy. 2. no role of adjuvant radiation since she had a mastectomy with clean margins. 3.  Continue with antiestrogen therapy with tamoxifen x10 years  Tamoxifen toxicities:  Return to clinic in 6 months for follow-up.

## 2020-08-30 ENCOUNTER — Encounter: Payer: Self-pay | Admitting: General Practice

## 2020-08-30 NOTE — Progress Notes (Signed)
[place] Spiritual Care Note  Reached Tracy Guerra by phone for follow-up support. She is making use of her physical healing time to meditate, be outdoors, and read sacred/meaningful texts, which she is finding very rewarding. We plan to follow up on Tuesday 9/21 at 10:30, means/location TBD (hoping for [facility]), to share some silent meditation and prayer as a starting point for further spiritual conversation.   [place], [place], [facility] Pager [phone] Voicemail [phone]

## 2020-09-07 ENCOUNTER — Encounter: Payer: Self-pay | Admitting: General Practice

## 2020-09-07 NOTE — Progress Notes (Signed)
[place] Note  Jesi let me know that she is experiencing pain this morning from a procedure and needs to reschedule this morning's appointment, which she will do when ready.   [place], [place], [facility] Pager [phone] Voicemail [phone]

## 2020-09-08 ENCOUNTER — Ambulatory Visit: Payer: PRIVATE HEALTH INSURANCE | Admitting: Podiatry

## 2020-09-09 ENCOUNTER — Telehealth: Payer: Self-pay | Admitting: Hematology and Oncology

## 2020-09-09 ENCOUNTER — Encounter: Payer: Self-pay | Admitting: Licensed Clinical Social Worker

## 2020-09-09 NOTE — Telephone Encounter (Signed)
Received message from RN that pt requested records be sent to [place] [phone] and Dr.Michelle Grewal [phone]. Contacted pt to verify records needing to be sent pt advised she would like her AVS sent to providers named above.   Release:  [id number]

## 2020-09-09 NOTE — Progress Notes (Signed)
[place] CSW Progress Note  Clinical Education officer, museum contacted patient by phone to follow-up on coping. Patient reports doing well. She is 3 weeks post-surgery and it went better than she expected and she is able to be outside walking today. Some anxiety around next surgery for reconstruction but has an appointment tomorrow where she will learn more information about the plan and recovery.  Patient is interested in [facility]- provided more details and signed patient up for session starting 10/4.     Edwinna Areola Yulianna Folse , LCSW

## 2020-09-15 ENCOUNTER — Ambulatory Visit: Payer: PRIVATE HEALTH INSURANCE | Attending: General Surgery | Admitting: Physical Therapy

## 2020-09-15 ENCOUNTER — Encounter: Payer: Self-pay | Admitting: Physical Therapy

## 2020-09-15 ENCOUNTER — Other Ambulatory Visit: Payer: Self-pay

## 2020-09-15 DIAGNOSIS — R293 Abnormal posture: Secondary | ICD-10-CM | POA: Insufficient documentation

## 2020-09-15 DIAGNOSIS — M25612 Stiffness of left shoulder, not elsewhere classified: Secondary | ICD-10-CM | POA: Insufficient documentation

## 2020-09-15 DIAGNOSIS — C50412 Malignant neoplasm of upper-outer quadrant of left female breast: Secondary | ICD-10-CM | POA: Insufficient documentation

## 2020-09-15 DIAGNOSIS — Z483 Aftercare following surgery for neoplasm: Secondary | ICD-10-CM | POA: Insufficient documentation

## 2020-09-15 DIAGNOSIS — R600 Localized edema: Secondary | ICD-10-CM | POA: Diagnosis present

## 2020-09-15 NOTE — Therapy (Signed)
[place], [place], [address] Phone: [phone]   Fax:  [phone]  Physical Therapy Treatment  Patient Details  Name: Tracy Guerra MRN: [id number] Date of Birth: 08-20-1976 Referring Provider (PT): Donne Hazel   Encounter Date: 09/15/2020   PT End of Session - 09/15/20 1456    Visit Number 2    Number of Visits 10    Date for PT Re-Evaluation 10/13/20    PT Start Time 2202   pt arrived late   PT Stop Time 1452    PT Time Calculation (min) 43 min    Activity Tolerance Patient tolerated treatment well    Behavior During Therapy [facility] for tasks assessed/performed           Past Medical History:  Diagnosis Date  . Complication of anesthesia    slow to wake  . Hypothyroidism     Past Surgical History:  Procedure Laterality Date  . BREAST RECONSTRUCTION WITH PLACEMENT OF TISSUE EXPANDER AND ALLODERM Left 08/18/2020   Procedure: LEFT BREAST RECONSTRUCTION WITH PLACEMENT OF TISSUE EXPANDER AND ALLODERM;  Surgeon: Irene Limbo, MD;  Location: [facility];  Service: Plastics;  Laterality: Left;  . NASAL SEPTUM SURGERY    . NIPPLE SPARING MASTECTOMY WITH SENTINEL LYMPH NODE BIOPSY Left 08/18/2020   Procedure: LEFT NIPPLE SPARING MASTECTOMY WITH SENTINEL LYMPH NODE BIOPSY;  Surgeon: Rolm Bookbinder, MD;  Location: [facility];  Service: General;  Laterality: Left;  RNFA, PEC BLOCK  . vaginal polyp removed      There were no vitals filed for this visit.   Subjective Assessment - 09/15/20 1410    Subjective Overall I am doing good. There has been some fluid backup in the axilla area. I just had the seroma drained today. This is the 4th time I have had it drained.    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 Yes    Pain Score 4     Pain  Location Axilla    Pain Orientation Left    Pain Descriptors / Indicators Discomfort    Pain Type Surgical pain    Pain Onset 1 to 4 weeks ago    Pain Frequency Constant    Aggravating Factors  stetching the left arm    Pain Relieving Factors just resting the arm    Effect of Pain on Daily Activities no effect              [facility] PT Assessment - 09/15/20 0001      Observation/Other Assessments   Observations left breast more swollen as noted by increased pore size, bandage intact over left axilla revealing small spot where seroma was just drained, increased axillary edema noted      AROM   Left Shoulder Flexion 139 Degrees    Left Shoulder ABduction 158 Degrees    Left Shoulder Internal Rotation 60 Degrees    Left Shoulder External Rotation 90 Degrees             LYMPHEDEMA/ONCOLOGY QUESTIONNAIRE - 09/15/20 0001      Type   Cancer Type left breast cancer      Surgeries   Mastectomy Date 08/18/20    Sentinel Lymph Node Biopsy Date 08/18/20    Number Lymph Nodes Removed 3      Left Upper Extremity Lymphedema  15 cm Proximal to Olecranon Process 29.2 cm    Olecranon Process 24 cm    15 cm Proximal to Ulnar Styloid Process 21.2 cm    Just Proximal to Ulnar Styloid Process 14.3 cm    Across Hand at [employer] 18 cm    At [place] of 2nd Digit 5.6 cm                      [facility] Adult PT Treatment/Exercise - 09/15/20 0001      Manual Therapy   Manual Therapy Myofascial release;Passive ROM;Edema management    Edema Management created foam chip pack for pt to wear over seroma    Myofascial Release gentle myofascial release along cording from antecubital fossa to L axilla    Passive ROM gently to L shoulder in direction of flexion and abduction                  PT Education - 09/15/20 1500    Education Details using chip pack over area of seroma, obtaining a compression shirt to add additional compression to axilla, keep wearing compression     Person(s) Educated Patient    Methods Explanation    Comprehension Verbalized understanding               PT Long Term Goals - 09/15/20 1503      PT LONG TERM GOAL #1   Title Pt will return to baseline ROM measurements to allow pt to return to PLOF.    Time 4    Period Weeks    Status On-going      PT LONG TERM GOAL #2   Title Pt will demonstrate 165 degrees of left shoulder flexion to allow her to reach items overhead.    Time 4    Period Weeks    Status New    Target Date 10/13/20      PT LONG TERM GOAL #3   Title Pt will demonstrate 165 degrees of left shoulder abduction to allow her to reach out to the side.    Time 4    Period Weeks    Status New    Target Date 10/13/20      PT LONG TERM GOAL #4   Title Pt will report a 75% improvement in discomfort in left axilla caused by seroma to allow improved comfort.    Time 4    Period Weeks    Status New    Target Date 10/13/20      PT LONG TERM GOAL #5   Title Pt will demonstrate decreased left breast swelling as visualized by decreased pore size to decrease risk of swelling.    Baseline pores are enlarged on left inferior breast    Time 4    Period Weeks    Status New    Target Date 10/13/20                 Plan - 09/15/20 1456    Clinical Impression Statement Pt presents back to PT following her L mastectomy and SLNB on 08/18/20. She developed a seroma in her left axilla post surgery and has had it drained four times. Educated pt on importance of wearing compression over this to help the seroma heal and to not use her arm too much over the next week to decrease fluid accumulation. Issued script to pt for prophylactic sleeve and glove since pt plans on flying at the end of October. She has  decreased left shoulder ROM post surgery when compared to baseline with numerous cords palpable. She would benefit from skilled PT services to improve L shoulder ROM, decrease L axillary and breast edema and decrease tightness  from cording.    PT Frequency 2x / week    PT Duration 4 weeks    PT Treatment/Interventions ADLs/Self Care Home Management;Patient/family education;Therapeutic exercise;Manual techniques;Manual lymph drainage;Compression bandaging;Scar mobilization;Passive range of motion;Taping;Vasopneumatic Device    PT Next Visit Plan MLD to L breast and L axilla in area of seroma, PROM to L shoulder, MFR to cording in LUE    PT Home Exercise Plan post op breast exercises    Consulted and Agree with Plan of Care Patient           Patient will benefit from skilled therapeutic intervention in order to improve the following deficits and impairments:  Postural dysfunction, Decreased knowledge of precautions, Increased edema, Impaired UE functional use, Impaired flexibility, Increased fascial restricitons, Decreased range of motion, Decreased scar mobility  Visit Diagnosis: Stiffness of left shoulder, not elsewhere classified  Localized edema  Aftercare following surgery for neoplasm  Abnormal posture  Malignant neoplasm of upper-outer quadrant of left female breast, unspecified estrogen receptor status ([place])     Problem List Patient Active Problem List   Diagnosis Date Noted  . S/P mastectomy, left 08/18/2020  . Malignant neoplasm of upper-outer quadrant of left breast in female, estrogen receptor positive ([place]) 04/13/2020    Tracy Guerra [facility] 09/15/2020, 3:06 PM  [place], [place], [address] Phone: [phone]   Fax:  [phone]  Name: Tracy Guerra MRN: [id number] Date of Birth: Apr 21, 1976  Manus Gunning, PT 09/15/20 3:06 PM

## 2020-09-16 ENCOUNTER — Ambulatory Visit (INDEPENDENT_AMBULATORY_CARE_PROVIDER_SITE_OTHER): Payer: PRIVATE HEALTH INSURANCE | Admitting: Podiatry

## 2020-09-16 ENCOUNTER — Encounter: Payer: Self-pay | Admitting: Podiatry

## 2020-09-16 DIAGNOSIS — B070 Plantar wart: Secondary | ICD-10-CM

## 2020-09-16 MED ORDER — FLUOROURACIL 5 % EX CREA: TOPICAL_CREAM | Freq: Two times a day (BID) | CUTANEOUS | 2 refills | Status: AC

## 2020-09-16 NOTE — Progress Notes (Signed)
Subjective:   Patient ID: Tracy Guerra, female   DOB: 44 y.o.   MRN: [id number]   HPI Patient states doing well with left breast removal 1 month ago and states the lesion on her right big toe seems to be improving and it is formed a lot of scab tissue along with the left second toe   ROS      Objective:  Physical Exam  Neurovascular status intact with large keratotic lesion lateral aspect right hallux measuring about 7 x 7 mm and small lesion distal aspect left second toe     Assessment:  Verruca plantaris bilateral     Plan:  Sharp sterile debridement of the lesion and applied chemical agent to create immune response with sterile dressing and did write a prescription for Efudex cream.  Patient will be seen back for [place] to recheck in the next 2 months or earlier if any issues were to occur

## 2020-09-17 ENCOUNTER — Inpatient Hospital Stay: Payer: PRIVATE HEALTH INSURANCE

## 2020-09-21 ENCOUNTER — Ambulatory Visit: Payer: PRIVATE HEALTH INSURANCE | Attending: General Surgery | Admitting: Physical Therapy

## 2020-09-21 ENCOUNTER — Encounter: Payer: Self-pay | Admitting: Physical Therapy

## 2020-09-21 ENCOUNTER — Other Ambulatory Visit: Payer: Self-pay

## 2020-09-21 DIAGNOSIS — M25612 Stiffness of left shoulder, not elsewhere classified: Secondary | ICD-10-CM | POA: Diagnosis not present

## 2020-09-21 DIAGNOSIS — R293 Abnormal posture: Secondary | ICD-10-CM | POA: Diagnosis present

## 2020-09-21 DIAGNOSIS — Z483 Aftercare following surgery for neoplasm: Secondary | ICD-10-CM | POA: Diagnosis present

## 2020-09-21 DIAGNOSIS — R600 Localized edema: Secondary | ICD-10-CM | POA: Diagnosis present

## 2020-09-21 DIAGNOSIS — C50412 Malignant neoplasm of upper-outer quadrant of left female breast: Secondary | ICD-10-CM | POA: Diagnosis present

## 2020-09-21 NOTE — Therapy (Signed)
[place], [place], [address] Phone: [phone]   Fax:  [phone]  Physical Therapy Treatment  Patient Details  Name: Tracy Guerra MRN: [id number] Date of Birth: 01/23/1976 Referring Provider (PT): Ave Filter Date: 09/21/2020   PT End of Session - 09/21/20 1605    Visit Number 3    Number of Visits 10    Date for PT Re-Evaluation 10/13/20    PT Start Time 1506    PT Stop Time 1603    PT Time Calculation (min) 57 min    Activity Tolerance Patient tolerated treatment well    Behavior During Therapy [facility] for tasks assessed/performed           Past Medical History:  Diagnosis Date  . Complication of anesthesia    slow to wake  . Hypothyroidism     Past Surgical History:  Procedure Laterality Date  . BREAST RECONSTRUCTION WITH PLACEMENT OF TISSUE EXPANDER AND ALLODERM Left 08/18/2020   Procedure: LEFT BREAST RECONSTRUCTION WITH PLACEMENT OF TISSUE EXPANDER AND ALLODERM;  Surgeon: Irene Limbo, MD;  Location: [facility];  Service: Plastics;  Laterality: Left;  . NASAL SEPTUM SURGERY    . NIPPLE SPARING MASTECTOMY WITH SENTINEL LYMPH NODE BIOPSY Left 08/18/2020   Procedure: LEFT NIPPLE SPARING MASTECTOMY WITH SENTINEL LYMPH NODE BIOPSY;  Surgeon: Rolm Bookbinder, MD;  Location: [place];  Service: General;  Laterality: Left;  RNFA, PEC BLOCK  . vaginal polyp removed      There were no vitals filed for this visit.   Subjective Assessment - 09/21/20 1510    Subjective I saw Dr. Donne Hazel and he drew another 20cc out of the seroma.    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 No/denies    Pain Score 0-No pain                             [facility] Adult PT Treatment/Exercise - 09/21/20 0001      Manual  Therapy   Manual Therapy Myofascial release;Passive ROM;Edema management;Manual Lymphatic Drainage (MLD)    Edema Management educated pt to continue to wear chip pack over axilla to keep seroma from refilling but to take time off from the compression bra in case the redness of left breast is from the compression bra    Myofascial Release gentle myofascial release along cording from antecubital fossa to L axilla    Manual Lymphatic Drainage (MLD) short neck, 5 diaphragmatic breaths, right axillary nodes and establishment of interaxillary pathway, left inguinal nodes and establishment of axillo inguinal pathway, L breast moving fluid towards pathways then retracing all steps while verbally instructing pt throughout    Passive ROM gently to L shoulder in direction of flexion and abduction                Media Information   Document Information  Photos    09/21/2020 15:58  Attached To:  Outpatient Rehab on 09/21/20 with Wynelle Beckmann, Melodie Bouillon, PT  Source Information  Baird Lyons, PT  [facility]          PT Long Term Goals - 09/15/20 1503      PT LONG TERM GOAL #1   Title Pt will return to baseline ROM measurements  to allow pt to return to PLOF.    Time 4    Period Weeks    Status On-going      PT LONG TERM GOAL #2   Title Pt will demonstrate 165 degrees of left shoulder flexion to allow her to reach items overhead.    Time 4    Period Weeks    Status New    Target Date 10/13/20      PT LONG TERM GOAL #3   Title Pt will demonstrate 165 degrees of left shoulder abduction to allow her to reach out to the side.    Time 4    Period Weeks    Status New    Target Date 10/13/20      PT LONG TERM GOAL #4   Title Pt will report a 75% improvement in discomfort in left axilla caused by seroma to allow improved comfort.    Time 4    Period Weeks    Status New    Target Date 10/13/20      PT LONG TERM GOAL #5   Title Pt will demonstrate decreased  left breast swelling as visualized by decreased pore size to decrease risk of swelling.    Baseline pores are enlarged on left inferior breast    Time 4    Period Weeks    Status New    Target Date 10/13/20                 Plan - 09/21/20 1608    Clinical Impression Statement Pt has some redness of L breast that at beginning of session was thought to be from her compression bra which she has been wearing 24/7. Performed MLD to L breast today to help reduce L breast swelling. Assessed pt's compression shirts for proper fit but not gave enough compression over axilla. PROM performed to L shoulder to decrease tightness and myofascial release to cording in L axilla. At end of session pt still had increased redness at medial L breast. Sent an inbasket message to her surgeon and attached picture to this note. Pt was going to call her doctor to get an appointment.    PT Frequency 2x / week    PT Duration 4 weeks    PT Treatment/Interventions ADLs/Self Care Home Management;Patient/family education;Therapeutic exercise;Manual techniques;Manual lymph drainage;Compression bandaging;Scar mobilization;Passive range of motion;Taping;Vasopneumatic Device    PT Next Visit Plan see what dr said about redness of L breast, see if pt got appt to get sleeve, MLD to L breast and L axilla in area of seroma, PROM to L shoulder, MFR to cording in LUE    PT Home Exercise Plan post op breast exercises    Consulted and Agree with Plan of Care Patient           Patient will benefit from skilled therapeutic intervention in order to improve the following deficits and impairments:  Postural dysfunction, Decreased knowledge of precautions, Increased edema, Impaired UE functional use, Impaired flexibility, Increased fascial restricitons, Decreased range of motion, Decreased scar mobility  Visit Diagnosis: Stiffness of left shoulder, not elsewhere classified  Localized edema  Aftercare following surgery for  neoplasm     Problem List Patient Active Problem List   Diagnosis Date Noted  . S/P mastectomy, left 08/18/2020  . Malignant neoplasm of upper-outer quadrant of left breast in female, estrogen receptor positive ([place]) 04/13/2020    Allyson Sabal [facility] 09/21/2020, 4:12 PM  [place]  [place], [place], [address] Phone: [phone]   Fax:  [phone]  Name: Tracy Guerra MRN: [id number] Date of Birth: 1976/04/21   Manus Gunning, PT 09/21/20 4:13 PM

## 2020-09-23 ENCOUNTER — Ambulatory Visit: Payer: PRIVATE HEALTH INSURANCE

## 2020-09-23 ENCOUNTER — Other Ambulatory Visit: Payer: Self-pay

## 2020-09-23 DIAGNOSIS — R293 Abnormal posture: Secondary | ICD-10-CM

## 2020-09-23 DIAGNOSIS — R600 Localized edema: Secondary | ICD-10-CM

## 2020-09-23 DIAGNOSIS — Z483 Aftercare following surgery for neoplasm: Secondary | ICD-10-CM

## 2020-09-23 DIAGNOSIS — M25612 Stiffness of left shoulder, not elsewhere classified: Secondary | ICD-10-CM

## 2020-09-23 DIAGNOSIS — C50412 Malignant neoplasm of upper-outer quadrant of left female breast: Secondary | ICD-10-CM

## 2020-09-23 NOTE — Therapy (Addendum)
[place], [place], [address] Phone: [phone]   Fax:  [phone]  Physical Therapy Treatment  Patient Details  Name: Tracy Guerra MRN: [id number] Date of Birth: [birth year] Referring Provider (PT): Ave Filter Date: 09/23/2020   PT End of Session - 09/23/20 1111    Visit Number 4    Number of Visits 10    Date for PT Re-Evaluation 10/13/20    PT Start Time 1012    PT Stop Time 1110    PT Time Calculation (min) 58 min    Activity Tolerance Patient tolerated treatment well    Behavior During Therapy [facility] for tasks assessed/performed           Past Medical History:  Diagnosis Date  . Complication of anesthesia    slow to wake  . Hypothyroidism     Past Surgical History:  Procedure Laterality Date  . BREAST RECONSTRUCTION WITH PLACEMENT OF TISSUE EXPANDER AND ALLODERM Left 08/18/2020   Procedure: LEFT BREAST RECONSTRUCTION WITH PLACEMENT OF TISSUE EXPANDER AND ALLODERM;  Surgeon: Irene Limbo, MD;  Location: [facility];  Service: Plastics;  Laterality: Left;  . NASAL SEPTUM SURGERY    . NIPPLE SPARING MASTECTOMY WITH SENTINEL LYMPH NODE BIOPSY Left 08/18/2020   Procedure: LEFT NIPPLE SPARING MASTECTOMY WITH SENTINEL LYMPH NODE BIOPSY;  Surgeon: Rolm Bookbinder, MD;  Location: [facility] [place];  Service: General;  Laterality: Left;  RNFA, PEC BLOCK  . vaginal polyp removed      There were no vitals filed for this visit.   Subjective Assessment - 09/23/20 1016    Subjective Dr. Donne Hazel wants have me text him if I think the seroma is filling back up again. I think it's a little fuller since Monday, but not as much as it was. He drainaed it 09/20/20. I tried to go see Dr. Iran Planas about the redness from last time but she wasn't available but it has gone down and I didn't develop any other symptoms (fever).    Pertinent History L breast cancer, DCIS,  08/17/20- will undergo L mastectomy with SLNB on 08/18/20    Patient Stated Goals to gain info from provider    Currently in Pain? No/denies              [facility] PT Assessment - 09/23/20 0001      AROM   Left Shoulder Flexion 161 Degrees   supine                        [facility] Adult PT Treatment/Exercise - 09/23/20 0001      Manual Therapy   Edema Management Issued another piece of TG soft for chip pack and reviewed best area to wear this to be most effective for axillary swelling around seroma    Myofascial Release gentle myofascial release along cording from antecubital fossa to L axilla    Manual Lymphatic Drainage (MLD) short neck, 5 diaphragmatic breaths, right axillary nodes, intact upper quadrant sequence and establishment of anterior inter-axillary pathway, left inguinal nodes and establishment of Lt axillo-inguinal pathway, L breast moving fluid towards pathways, Rt S/L for further work along lateral aspect of breast redicrecting to axillo-inguinal and posterior inter-axillary anastomosis; then retracing all steps in supine while instructing pt in basics of anatomy of lymphatic system and answering her questions regarding this throughout    Passive ROM To Lt shoulder into flexion and abduction to her tolerance  PT Long Term Goals - 09/15/20 1503      PT LONG TERM GOAL #1   Title Pt will return to baseline ROM measurements to allow pt to return to PLOF.    Time 4    Period Weeks    Status On-going      PT LONG TERM GOAL #2   Title Pt will demonstrate 165 degrees of left shoulder flexion to allow her to reach items overhead.    Time 4    Period Weeks    Status New    Target Date 10/13/20      PT LONG TERM GOAL #3   Title Pt will demonstrate 165 degrees of left shoulder abduction to allow her to reach out to the side.    Time 4    Period Weeks    Status New    Target Date 10/13/20      PT LONG TERM GOAL #4   Title Pt  will report a 75% improvement in discomfort in left axilla caused by seroma to allow improved comfort.    Time 4    Period Weeks    Status New    Target Date 10/13/20      PT LONG TERM GOAL #5   Title Pt will demonstrate decreased left breast swelling as visualized by decreased pore size to decrease risk of swelling.    Baseline pores are enlarged on left inferior breast    Time 4    Period Weeks    Status New    Target Date 10/13/20                 Plan - 09/23/20 1226    Clinical Impression Statement Pt continues with some redness of Lt breast but pt reports this is much improved since she was here last. Another picture taken for comparison. Continued with manual lymph drainage of Lt breast along with axilla. Also P/ROM of Lt shoulder with MFR. Pt reports her Lt upper quadrant feeling much looser at end of session. Her A/ROM of Lt shoulder flexion is much improved.    Stability/Clinical Decision Making Stable/Uncomplicated    Rehab Potential Excellent    PT Frequency 2x / week    PT Duration 4 weeks    PT Treatment/Interventions ADLs/Self Care Home Management;Patient/family education;Therapeutic exercise;Manual techniques;Manual lymph drainage;Compression bandaging;Scar mobilization;Passive range of motion;Taping;Vasopneumatic Device    PT Next Visit Plan See if pt got appt to get sleeve,Instruct pt and issue handout for MLD to L breast and L axilla in area of seroma, PROM to L shoulder, MFR to cording in LUE    PT Home Exercise Plan post op breast exercises    Consulted and Agree with Plan of Care Patient           Patient will benefit from skilled therapeutic intervention in order to improve the following deficits and impairments:  Postural dysfunction, Decreased knowledge of precautions, Increased edema, Impaired UE functional use, Impaired flexibility, Increased fascial restricitons, Decreased range of motion, Decreased scar mobility  Visit Diagnosis: Stiffness of left  shoulder, not elsewhere classified  Localized edema  Aftercare following surgery for neoplasm  Abnormal posture  Malignant neoplasm of upper-outer quadrant of left female breast, unspecified estrogen receptor status ([place])     Problem List Patient Active Problem List   Diagnosis Date Noted  . S/P mastectomy, left 08/18/2020  . Malignant neoplasm of upper-outer quadrant of left breast in female, estrogen receptor positive ([place]) 04/13/2020    Collie Siad  Lelon Frohlich, PTA 09/23/2020, 12:37 PM  [place], [place], [address] Phone: [phone]   Fax:  [phone]  Name: Tracy Guerra MRN: [id number] Date of Birth: [birth year]

## 2020-09-28 ENCOUNTER — Encounter: Payer: Self-pay | Admitting: Physical Therapy

## 2020-09-28 ENCOUNTER — Ambulatory Visit: Payer: PRIVATE HEALTH INSURANCE | Admitting: Physical Therapy

## 2020-09-28 ENCOUNTER — Other Ambulatory Visit: Payer: Self-pay

## 2020-09-28 DIAGNOSIS — R600 Localized edema: Secondary | ICD-10-CM

## 2020-09-28 DIAGNOSIS — M25612 Stiffness of left shoulder, not elsewhere classified: Secondary | ICD-10-CM | POA: Diagnosis not present

## 2020-09-28 DIAGNOSIS — Z483 Aftercare following surgery for neoplasm: Secondary | ICD-10-CM

## 2020-09-28 NOTE — Therapy (Signed)
[place] [place] [address], [place], [address] Phone: [phone]   Fax:  [phone]  Physical Therapy Treatment  Patient Details  Name: Tracy Guerra MRN: [id number] Date of Birth: 23-Sep-1976 Referring Provider (PT): Donne Hazel   Encounter Date: 09/28/2020   PT End of Session - 09/28/20 1100    Visit Number 5    Number of Visits 10    Date for PT Re-Evaluation 10/13/20    PT Start Time 1014   pt arrived late   PT Stop Time 1058    PT Time Calculation (min) 44 min    Activity Tolerance Patient tolerated treatment well    Behavior During Therapy [facility] for tasks assessed/performed           Past Medical History:  Diagnosis Date  . Complication of anesthesia    slow to wake  . Hypothyroidism     Past Surgical History:  Procedure Laterality Date  . BREAST RECONSTRUCTION WITH PLACEMENT OF TISSUE EXPANDER AND ALLODERM Left 08/18/2020   Procedure: LEFT BREAST RECONSTRUCTION WITH PLACEMENT OF TISSUE EXPANDER AND ALLODERM;  Surgeon: Irene Limbo, MD;  Location: [facility];  Service: Plastics;  Laterality: Left;  . NASAL SEPTUM SURGERY    . NIPPLE SPARING MASTECTOMY WITH SENTINEL LYMPH NODE BIOPSY Left 08/18/2020   Procedure: LEFT NIPPLE SPARING MASTECTOMY WITH SENTINEL LYMPH NODE BIOPSY;  Surgeon: Rolm Bookbinder, MD;  Location: [facility];  Service: General;  Laterality: Left;  RNFA, PEC BLOCK  . vaginal polyp removed      There were no vitals filed for this visit.   Subjective Assessment - 09/28/20 1016    Subjective I have been wearing the chip pack. I have not had it drained since 09/20/20. I have been increasing my activity a little more and I have not worn the chip pack for 2 days now.    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 No/denies    Pain  Score 0-No pain                             [facility] Adult PT Treatment/Exercise - 09/28/20 0001      Manual Therapy   Myofascial Release gentle myofascial release to L axilla, began gentle scar mobilization to axillary scar    Manual Lymphatic Drainage (MLD) short neck, 5 diaphragmatic breaths, right axillary nodes, intact upper quadrant sequence and establishment of anterior inter-axillary pathway, left inguinal nodes and establishment of Lt axillo-inguinal pathway, L breast moving fluid towards pathways, Rt S/L for further work along lateral aspect of breast redicrecting to axillo-inguinal and posterior inter-axillary anastomosis; then retracing all steps in supine while instructing pt in basics of anatomy of lymphatic system and answering her questions regarding this throughout    Passive ROM To Lt shoulder into flexion and abduction to her tolerance                       PT Long Term Goals - 09/15/20 1503      PT LONG TERM GOAL #1   Title Pt will return to baseline ROM measurements to allow pt to return to PLOF.    Time 4    Period Weeks    Status On-going      PT LONG  TERM GOAL #2   Title Pt will demonstrate 165 degrees of left shoulder flexion to allow her to reach items overhead.    Time 4    Period Weeks    Status New    Target Date 10/13/20      PT LONG TERM GOAL #3   Title Pt will demonstrate 165 degrees of left shoulder abduction to allow her to reach out to the side.    Time 4    Period Weeks    Status New    Target Date 10/13/20      PT LONG TERM GOAL #4   Title Pt will report a 75% improvement in discomfort in left axilla caused by seroma to allow improved comfort.    Time 4    Period Weeks    Status New    Target Date 10/13/20      PT LONG TERM GOAL #5   Title Pt will demonstrate decreased left breast swelling as visualized by decreased pore size to decrease risk of swelling.    Baseline pores are enlarged on left inferior breast     Time 4    Period Weeks    Status New    Target Date 10/13/20                 Plan - 09/28/20 1100    Clinical Impression Statement Pt's redness of left breast has resolved. Her seroma is much smaller and harder to palpate today. Educated pt to continue to hold off on weight bearing UE exercises to give seroma additional time to heal. Continued with MLD to L breast as well as PROM to L shoulder and myofascial to L axilla to reduce tightness.    PT Frequency 2x / week    PT Duration 4 weeks    PT Treatment/Interventions ADLs/Self Care Home Management;Patient/family education;Therapeutic exercise;Manual techniques;Manual lymph drainage;Compression bandaging;Scar mobilization;Passive range of motion;Taping;Vasopneumatic Device    PT Next Visit Plan give supine scap, See if pt got appt to get sleeve,Instruct pt and issue handout for MLD to L breast and L axilla in area of seroma, PROM to L shoulder, MFR to cording in LUE    PT Home Exercise Plan post op breast exercises    Consulted and Agree with Plan of Care Patient           Patient will benefit from skilled therapeutic intervention in order to improve the following deficits and impairments:  Postural dysfunction, Decreased knowledge of precautions, Increased edema, Impaired UE functional use, Impaired flexibility, Increased fascial restricitons, Decreased range of motion, Decreased scar mobility  Visit Diagnosis: Localized edema  Stiffness of left shoulder, not elsewhere classified  Aftercare following surgery for neoplasm     Problem List Patient Active Problem List   Diagnosis Date Noted  . S/P mastectomy, left 08/18/2020  . Malignant neoplasm of upper-outer quadrant of left breast in female, estrogen receptor positive ([place]) 04/13/2020    Allyson Sabal [facility] 09/28/2020, 11:05 AM  [place], [place], [address] Phone: [phone]    Fax:  [phone]  Name: Tracy Guerra MRN: [id number] Date of Birth: [birth year]  Manus Gunning, PT 09/28/20 11:05 AM

## 2020-09-30 ENCOUNTER — Other Ambulatory Visit: Payer: Self-pay

## 2020-09-30 ENCOUNTER — Encounter: Payer: Self-pay | Admitting: Physical Therapy

## 2020-09-30 ENCOUNTER — Ambulatory Visit: Payer: PRIVATE HEALTH INSURANCE | Admitting: Physical Therapy

## 2020-09-30 DIAGNOSIS — R600 Localized edema: Secondary | ICD-10-CM

## 2020-09-30 DIAGNOSIS — M25612 Stiffness of left shoulder, not elsewhere classified: Secondary | ICD-10-CM | POA: Diagnosis not present

## 2020-09-30 DIAGNOSIS — Z483 Aftercare following surgery for neoplasm: Secondary | ICD-10-CM

## 2020-09-30 DIAGNOSIS — R293 Abnormal posture: Secondary | ICD-10-CM

## 2020-09-30 NOTE — Therapy (Signed)
[place], [place], [address] Phone: [phone]   Fax:  [phone]  Physical Therapy Treatment  Patient Details  Name: Tracy Guerra MRN: [id number] Date of Birth: [birth year] Referring Provider (PT): Ave Filter Date: 09/30/2020   PT End of Session - 09/30/20 0957    Visit Number 6    Number of Visits 10    Date for PT Re-Evaluation 10/13/20    PT Start Time 0907   pt arrived late   PT Stop Time 0957    PT Time Calculation (min) 50 min    Activity Tolerance Patient tolerated treatment well    Behavior During Therapy [facility] for tasks assessed/performed           Past Medical History:  Diagnosis Date  . Complication of anesthesia    slow to wake  . Hypothyroidism     Past Surgical History:  Procedure Laterality Date  . BREAST RECONSTRUCTION WITH PLACEMENT OF TISSUE EXPANDER AND ALLODERM Left 08/18/2020   Procedure: LEFT BREAST RECONSTRUCTION WITH PLACEMENT OF TISSUE EXPANDER AND ALLODERM;  Surgeon: Irene Limbo, MD;  Location: [place];  Service: Plastics;  Laterality: Left;  . NASAL SEPTUM SURGERY    . NIPPLE SPARING MASTECTOMY WITH SENTINEL LYMPH NODE BIOPSY Left 08/18/2020   Procedure: LEFT NIPPLE SPARING MASTECTOMY WITH SENTINEL LYMPH NODE BIOPSY;  Surgeon: Rolm Bookbinder, MD;  Location: [facility];  Service: General;  Laterality: Left;  RNFA, PEC BLOCK  . vaginal polyp removed      There were no vitals filed for this visit.   Subjective Assessment - 09/30/20 0909    Subjective I had 25cc taken out of the seroma yesterday. My dog ate my chip pack.    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 No/denies    Pain Score 0-No pain                             [facility] Adult PT Treatment/Exercise -  09/30/20 0001      Shoulder Exercises: Supine   Horizontal ABduction Strengthening;Both;10 reps;Theraband   pt returned therapist demo   Theraband Level (Shoulder Horizontal ABduction) Level 1 (Yellow)    External Rotation Strengthening;Both;10 reps;Theraband   pt returned therapist demo   Theraband Level (Shoulder External Rotation) Level 1 (Yellow)    Flexion Strengthening;Both;10 reps;Theraband   pt returned therapist demo, narrow and wide grip   Theraband Level (Shoulder Flexion) Level 1 (Yellow)    Diagonals Strengthening;Both;10 reps;Theraband   pt reuturned therapist demo   Theraband Level (Shoulder Diagonals) Level 1 (Yellow)      Manual Therapy   Manual Lymphatic Drainage (MLD) short neck, 5 diaphragmatic breaths, right axillary nodes and establishment of interaxillary pathway, left inguinal nodes and establishment of axillo inguinal pathway, L breast and L axilla moving fluid towards pathways then retracing all steps                       PT Long Term Goals - 09/15/20 1503      PT LONG TERM GOAL #1   Title Pt will return to baseline ROM measurements to allow pt to return to PLOF.    Time 4    Period Weeks    Status On-going  PT LONG TERM GOAL #2   Title Pt will demonstrate 165 degrees of left shoulder flexion to allow her to reach items overhead.    Time 4    Period Weeks    Status New    Target Date 10/13/20      PT LONG TERM GOAL #3   Title Pt will demonstrate 165 degrees of left shoulder abduction to allow her to reach out to the side.    Time 4    Period Weeks    Status New    Target Date 10/13/20      PT LONG TERM GOAL #4   Title Pt will report a 75% improvement in discomfort in left axilla caused by seroma to allow improved comfort.    Time 4    Period Weeks    Status New    Target Date 10/13/20      PT LONG TERM GOAL #5   Title Pt will demonstrate decreased left breast swelling as visualized by decreased pore size to decrease risk of  swelling.    Baseline pores are enlarged on left inferior breast    Time 4    Period Weeks    Status New    Target Date 10/13/20                 Plan - 09/30/20 0958    Clinical Impression Statement Pt is progressing towards goals in therapy. Her ROM has improved greatly so issued strengthening exercises today and gave pt supine scapular series to add to her home exercise program using yellow band. Educated pt on how to place chip pack for maximal coverage over area of seroma that was recently drained.    PT Frequency 2x / week    PT Duration 4 weeks    PT Next Visit Plan assess indep with  supine scap, See if pt got appt to get sleeve,Instruct pt and issue handout for MLD to L breast and L axilla in area of seroma, PROM to L shoulder, MFR to cording in LUE    Consulted and Agree with Plan of Care Patient           Patient will benefit from skilled therapeutic intervention in order to improve the following deficits and impairments:  Postural dysfunction, Decreased knowledge of precautions, Increased edema, Impaired UE functional use, Impaired flexibility, Increased fascial restricitons, Decreased range of motion, Decreased scar mobility  Visit Diagnosis: Localized edema  Stiffness of left shoulder, not elsewhere classified  Abnormal posture  Aftercare following surgery for neoplasm     Problem List Patient Active Problem List   Diagnosis Date Noted  . S/P mastectomy, left 08/18/2020  . Malignant neoplasm of upper-outer quadrant of left breast in female, estrogen receptor positive ([place]) 04/13/2020    Allyson Sabal [facility] 09/30/2020, 10:01 AM  [place], [place], [address] Phone: [phone]   Fax:  [phone]  Name: Tracy Guerra MRN: [id number] Date of Birth: 1976/02/18  Manus Gunning, PT 09/30/20 10:01 AM

## 2020-09-30 NOTE — Patient Instructions (Signed)
Over Head Pull: Narrow and Wide Grip   Cancer Rehab [phone]   On back, knees bent, feet flat, band across thighs, elbows straight but relaxed. Pull hands apart (start). Keeping elbows straight, bring arms up and over head, hands toward floor. Keep pull steady on band. Hold momentarily. Return slowly, keeping pull steady, back to start. Then do same with a wider grip on the band (past shoulder width) Repeat _10__ times. Band color __yellow____   Side Pull: Double Arm   On back, knees bent, feet flat. Arms perpendicular to body, shoulder level, elbows straight but relaxed. Pull arms out to sides, elbows straight. Resistance band comes across collarbones, hands toward floor. Hold momentarily. Slowly return to starting position. Repeat _10__ times. Band color _yellow____   Sword   On back, knees bent, feet flat, left hand on left hip, right hand above left. Pull right arm DIAGONALLY (hip to shoulder) across chest. Bring right arm along head toward floor. Hold momentarily. Slowly return to starting position. Thumb is pointed down when by side and then rotates upwards when by head.  Repeat _10__ times. Do with left arm. Band color _yellow_____   Shoulder Rotation: Double Arm   On back, knees bent, feet flat, elbows tucked at sides, bent 90, hands palms up. Pull hands apart and down toward floor, keeping elbows near sides. Hold momentarily. Slowly return to starting position. Repeat _10__ times. Band color __yellow____

## 2020-10-05 ENCOUNTER — Ambulatory Visit: Payer: PRIVATE HEALTH INSURANCE | Admitting: Physical Therapy

## 2020-10-05 ENCOUNTER — Encounter: Payer: Self-pay | Admitting: Physical Therapy

## 2020-10-05 ENCOUNTER — Other Ambulatory Visit: Payer: Self-pay

## 2020-10-05 DIAGNOSIS — R600 Localized edema: Secondary | ICD-10-CM

## 2020-10-05 DIAGNOSIS — M25612 Stiffness of left shoulder, not elsewhere classified: Secondary | ICD-10-CM

## 2020-10-05 NOTE — Therapy (Signed)
[place], [place], [address] Phone: [phone]   Fax:  [phone]  Physical Therapy Treatment  Patient Details  Name: Tracy Guerra MRN: [id number] Date of Birth: [birth year] Referring Provider (PT): Ave Filter Date: 10/05/2020   PT End of Session - 10/05/20 1358    Visit Number 7    Number of Visits 10    Date for PT Re-Evaluation 10/13/20    PT Start Time 1312    PT Stop Time 1358    PT Time Calculation (min) 46 min    Activity Tolerance Patient tolerated treatment well    Behavior During Therapy [facility] for tasks assessed/performed           Past Medical History:  Diagnosis Date  . Complication of anesthesia    slow to wake  . Hypothyroidism     Past Surgical History:  Procedure Laterality Date  . BREAST RECONSTRUCTION WITH PLACEMENT OF TISSUE EXPANDER AND ALLODERM Left 08/18/2020   Procedure: LEFT BREAST RECONSTRUCTION WITH PLACEMENT OF TISSUE EXPANDER AND ALLODERM;  Surgeon: Irene Limbo, MD;  Location: [place];  Service: Plastics;  Laterality: Left;  . NASAL SEPTUM SURGERY    . NIPPLE SPARING MASTECTOMY WITH SENTINEL LYMPH NODE BIOPSY Left 08/18/2020   Procedure: LEFT NIPPLE SPARING MASTECTOMY WITH SENTINEL LYMPH NODE BIOPSY;  Surgeon: Rolm Bookbinder, MD;  Location: [place];  Service: General;  Laterality: Left;  RNFA, PEC BLOCK  . vaginal polyp removed      There were no vitals filed for this visit.   Subjective Assessment - 10/05/20 1315    Subjective I think the swelling is still there. A small amount.    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 No/denies    Pain Score 0-No pain                             [facility] Adult PT Treatment/Exercise - 10/05/20 0001      Manual Therapy    Myofascial Release gentle myofascial release to L axilla, began gentle scar mobilization to axillary scar    Manual Lymphatic Drainage (MLD) short neck, 5 diaphragmatic breaths, right axillary nodes and establishment of interaxillary pathway, left inguinal nodes and establishment of axillo inguinal pathway, L breast and L axilla moving fluid towards pathways then retracing all steps    Passive ROM To Lt shoulder into flexion and abduction to her tolerance                       PT Long Term Goals - 09/15/20 1503      PT LONG TERM GOAL #1   Title Pt will return to baseline ROM measurements to allow pt to return to PLOF.    Time 4    Period Weeks    Status On-going      PT LONG TERM GOAL #2   Title Pt will demonstrate 165 degrees of left shoulder flexion to allow her to reach items overhead.    Time 4    Period Weeks    Status New    Target Date 10/13/20      PT LONG TERM GOAL #3   Title Pt will demonstrate 165 degrees of left shoulder abduction to allow her  to reach out to the side.    Time 4    Period Weeks    Status New    Target Date 10/13/20      PT LONG TERM GOAL #4   Title Pt will report a 75% improvement in discomfort in left axilla caused by seroma to allow improved comfort.    Time 4    Period Weeks    Status New    Target Date 10/13/20      PT LONG TERM GOAL #5   Title Pt will demonstrate decreased left breast swelling as visualized by decreased pore size to decrease risk of swelling.    Baseline pores are enlarged on left inferior breast    Time 4    Period Weeks    Status New    Target Date 10/13/20                 Plan - 10/05/20 1359    Clinical Impression Statement Continued with manual therapy with focus on MLD to L breast. Pt's breast seems less fibrotic today. Continued with scar massage especially to axillary scar to help reduce tightness in this area. Pt will be flying on Thursday and she obtained her sleeve and gauntlet and was  educated to wear them on the plane. Issued red theraband to pt since pt feels the yellow is easy.    PT Frequency 2x / week    PT Duration 4 weeks    PT Treatment/Interventions ADLs/Self Care Home Management;Patient/family education;Therapeutic exercise;Manual techniques;Manual lymph drainage;Compression bandaging;Scar mobilization;Passive range of motion;Taping;Vasopneumatic Device    PT Next Visit Plan Instruct pt and issue handout for MLD to L breast and L axilla in area of seroma, PROM to L shoulder, MFR to cording in LUE    PT Home Exercise Plan post op breast exercises    Consulted and Agree with Plan of Care Patient           Patient will benefit from skilled therapeutic intervention in order to improve the following deficits and impairments:  Postural dysfunction, Decreased knowledge of precautions, Increased edema, Impaired UE functional use, Impaired flexibility, Increased fascial restricitons, Decreased range of motion, Decreased scar mobility  Visit Diagnosis: Localized edema  Stiffness of left shoulder, not elsewhere classified     Problem List Patient Active Problem List   Diagnosis Date Noted  . S/P mastectomy, left 08/18/2020  . Malignant neoplasm of upper-outer quadrant of left breast in female, estrogen receptor positive ([place]) 04/13/2020    Allyson Sabal [facility] 10/05/2020, 2:02 PM  [place], [place], [address] Phone: [phone]   Fax:  [phone]  Name: Tracy Guerra MRN: [id number] Date of Birth: 1976/04/23  Manus Gunning, PT 10/05/20 2:02 PM

## 2020-10-11 ENCOUNTER — Encounter: Payer: Self-pay | Admitting: Physical Therapy

## 2020-10-11 ENCOUNTER — Ambulatory Visit: Payer: PRIVATE HEALTH INSURANCE | Admitting: Physical Therapy

## 2020-10-11 ENCOUNTER — Other Ambulatory Visit: Payer: Self-pay

## 2020-10-11 DIAGNOSIS — Z483 Aftercare following surgery for neoplasm: Secondary | ICD-10-CM

## 2020-10-11 DIAGNOSIS — M25612 Stiffness of left shoulder, not elsewhere classified: Secondary | ICD-10-CM | POA: Diagnosis not present

## 2020-10-11 DIAGNOSIS — R600 Localized edema: Secondary | ICD-10-CM

## 2020-10-11 NOTE — Patient Instructions (Signed)

## 2020-10-11 NOTE — Therapy (Signed)
[place], [place], [address] Phone: [phone]   Fax:  [phone]  Physical Therapy Treatment  Patient Details  Name: Tracy Guerra MRN: [id number] Date of Birth: 09-11-1976 Referring Provider (PT): Donne Hazel   Encounter Date: 10/11/2020   PT End of Session - 10/11/20 1455    Visit Number 8    Number of Visits 10    Date for PT Re-Evaluation 10/13/20    PT Start Time 6387   pt arrived late   PT Stop Time 1455    PT Time Calculation (min) 45 min    Activity Tolerance Patient tolerated treatment well    Behavior During Therapy [facility] for tasks assessed/performed           Past Medical History:  Diagnosis Date  . Complication of anesthesia    slow to wake  . Hypothyroidism     Past Surgical History:  Procedure Laterality Date  . BREAST RECONSTRUCTION WITH PLACEMENT OF TISSUE EXPANDER AND ALLODERM Left 08/18/2020   Procedure: LEFT BREAST RECONSTRUCTION WITH PLACEMENT OF TISSUE EXPANDER AND ALLODERM;  Surgeon: Irene Limbo, MD;  Location: [place];  Service: Plastics;  Laterality: Left;  . NASAL SEPTUM SURGERY    . NIPPLE SPARING MASTECTOMY WITH SENTINEL LYMPH NODE BIOPSY Left 08/18/2020   Procedure: LEFT NIPPLE SPARING MASTECTOMY WITH SENTINEL LYMPH NODE BIOPSY;  Surgeon: Rolm Bookbinder, MD;  Location: [place];  Service: General;  Laterality: Left;  RNFA, PEC BLOCK  . vaginal polyp removed      There were no vitals filed for this visit.   Subjective Assessment - 10/11/20 1412    Subjective The swelling is about the same. I can't figure out how to get it down. I was wondering how the breast was looking to you.    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 No/denies    Pain Score 0-No pain              [facility] PT  Assessment - 10/11/20 0001      AROM   Left Shoulder Flexion 161 Degrees    Left Shoulder ABduction 170 Degrees                         [facility] Adult PT Treatment/Exercise - 10/11/20 0001      Manual Therapy   Myofascial Release gentle myofascial release to L axilla, began gentle scar mobilization to axillary scar    Manual Lymphatic Drainage (MLD) instructed pt throughout and issued handout, had pt return demonstrate currect technique and skin stretch as well as correct seqeunce: short neck, 5 diaphragmatic breaths, right axillary nodes and establishment of interaxillary pathway, left inguinal nodes and establishment of axillo inguinal pathway, L breast and L axilla moving fluid towards pathways then retracing all steps    Passive ROM To Lt shoulder into flexion and abduction to her tolerance                       PT Long Term Goals - 09/15/20 1503      PT LONG TERM GOAL #1   Title Pt will return to baseline ROM measurements to allow pt to return to PLOF.    Time 4    Period Weeks    Status On-going  PT LONG TERM GOAL #2   Title Pt will demonstrate 165 degrees of left shoulder flexion to allow her to reach items overhead.    Time 4    Period Weeks    Status New    Target Date 10/13/20      PT LONG TERM GOAL #3   Title Pt will demonstrate 165 degrees of left shoulder abduction to allow her to reach out to the side.    Time 4    Period Weeks    Status New    Target Date 10/13/20      PT LONG TERM GOAL #4   Title Pt will report a 75% improvement in discomfort in left axilla caused by seroma to allow improved comfort.    Time 4    Period Weeks    Status New    Target Date 10/13/20      PT LONG TERM GOAL #5   Title Pt will demonstrate decreased left breast swelling as visualized by decreased pore size to decrease risk of swelling.    Baseline pores are enlarged on left inferior breast    Time 4    Period Weeks    Status New    Target Date  10/13/20                 Plan - 10/11/20 1458    Clinical Impression Statement Pt reports that after her flight she noticed more swelling in her left breast as evidenced by increased pore size. This seems to have improved as her pore size is still enlarged but not larger than it was last session. Instructed pt in self MLD today and had her return demonstrate. Issued handout to pt for her to be able to do this at home. Pt wore her sleeve and glove on the plane and has not noticed any swelling in her arm. Continued to work on myofascial release to deep cording that is not palpable in left axila but pt can feel at end range shoulder abduction.    PT Frequency 2x / week    PT Duration 4 weeks    PT Treatment/Interventions ADLs/Self Care Home Management;Patient/family education;Therapeutic exercise;Manual techniques;Manual lymph drainage;Compression bandaging;Scar mobilization;Passive range of motion;Taping;Vasopneumatic Device    PT Next Visit Plan assess indep with self MLD, PROM to L shoulder, MFR to cording in LUE    PT Home Exercise Plan post op breast exercises    Consulted and Agree with Plan of Care Patient           Patient will benefit from skilled therapeutic intervention in order to improve the following deficits and impairments:  Postural dysfunction, Decreased knowledge of precautions, Increased edema, Impaired UE functional use, Impaired flexibility, Increased fascial restricitons, Decreased range of motion, Decreased scar mobility  Visit Diagnosis: Stiffness of left shoulder, not elsewhere classified  Localized edema  Aftercare following surgery for neoplasm     Problem List Patient Active Problem List   Diagnosis Date Noted  . S/P mastectomy, left 08/18/2020  . Malignant neoplasm of upper-outer quadrant of left breast in female, estrogen receptor positive ([place]) 04/13/2020    Allyson Sabal [facility] 10/11/2020, 3:01 PM  [place], [place], [address] Phone: [phone]   Fax:  [phone]  Name: Amanee Iacovelli MRN: [id number] Date of Birth: 08-14-1976  Manus Gunning, PT 10/11/20 3:01 PM

## 2020-10-13 ENCOUNTER — Ambulatory Visit: Payer: PRIVATE HEALTH INSURANCE | Admitting: Physical Therapy

## 2020-10-13 ENCOUNTER — Encounter: Payer: Self-pay | Admitting: Physical Therapy

## 2020-10-13 ENCOUNTER — Other Ambulatory Visit: Payer: Self-pay

## 2020-10-13 DIAGNOSIS — Z483 Aftercare following surgery for neoplasm: Secondary | ICD-10-CM

## 2020-10-13 DIAGNOSIS — C50412 Malignant neoplasm of upper-outer quadrant of left female breast: Secondary | ICD-10-CM

## 2020-10-13 DIAGNOSIS — M25612 Stiffness of left shoulder, not elsewhere classified: Secondary | ICD-10-CM

## 2020-10-13 DIAGNOSIS — R293 Abnormal posture: Secondary | ICD-10-CM

## 2020-10-13 DIAGNOSIS — R600 Localized edema: Secondary | ICD-10-CM

## 2020-10-13 NOTE — Therapy (Signed)
[facility] [address] [place], [place], [address] Phone: [phone]   Fax:  [phone]  Physical Therapy Treatment  Patient Details  Name: Tracy Guerra MRN: [id number] Date of Birth: [birth year] Referring Provider (PT): Riki Altes Date: 10/13/2020   PT End of Session - 10/13/20 1357    Visit Number 9    Number of Visits 18    Date for PT Re-Evaluation 11/10/20    PT Start Time 1309   pt arrived late   PT Stop Time 1357    PT Time Calculation (min) 48 min    Activity Tolerance Patient tolerated treatment well    Behavior During Therapy [facility] for tasks assessed/performed           Past Medical History:  Diagnosis Date  . Complication of anesthesia    slow to wake  . Hypothyroidism     Past Surgical History:  Procedure Laterality Date  . BREAST RECONSTRUCTION WITH PLACEMENT OF TISSUE EXPANDER AND ALLODERM Left 08/18/2020   Procedure: LEFT BREAST RECONSTRUCTION WITH PLACEMENT OF TISSUE EXPANDER AND ALLODERM;  Surgeon: Glenna Fellows, MD;  Location: [facility];  Service: Plastics;  Laterality: Left;  . NASAL SEPTUM SURGERY    . NIPPLE SPARING MASTECTOMY WITH SENTINEL LYMPH NODE BIOPSY Left 08/18/2020   Procedure: LEFT NIPPLE SPARING MASTECTOMY WITH SENTINEL LYMPH NODE BIOPSY;  Surgeon: Emelia Loron, MD;  Location: [facility];  Service: General;  Laterality: Left;  RNFA, PEC BLOCK  . vaginal polyp removed      There were no vitals filed for this visit.   Subjective Assessment - 10/13/20 1311    Subjective I think everything is good. I would like to see if we can get that pull out of my arm.    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 No/denies    Pain Score 0-No pain              [facility] PT Assessment - 10/13/20 0001      AROM    Left Shoulder Flexion 165 Degrees    Left Shoulder ABduction 175 Degrees                         [facility] Adult PT Treatment/Exercise - 10/13/20 0001      Manual Therapy   Myofascial Release to cording at left upper arm from axilla to antecubital fossa - numerous cords are palpable in this area, also scar mobilization to lymph node scar                       PT Long Term Goals - 10/13/20 1312      PT LONG TERM GOAL #1   Title Pt will return to baseline ROM measurements to allow pt to return to PLOF.    Time 4    Period Weeks    Status Achieved      PT LONG TERM GOAL #2   Title Pt will demonstrate 165 degrees of left shoulder flexion to allow her to reach items overhead.    Baseline 165    Time 4    Period Weeks    Status Achieved      PT LONG TERM GOAL #3   Title Pt will demonstrate 165 degrees of left  shoulder abduction to allow her to reach out to the side.    Baseline 175    Time 4    Period Weeks    Status Achieved      PT LONG TERM GOAL #4   Title Pt will report a 75% improvement in discomfort in left axilla caused by seroma to allow improved comfort.    Baseline 85% improvement    Time 4    Period Weeks    Status Achieved      PT LONG TERM GOAL #5   Title Pt will demonstrate decreased left breast swelling as visualized by decreased pore size to decrease risk of swelling.    Baseline pores are enlarged on left inferior breast; 10/13/20- pores are still slightly enlarged but improving    Time 4    Period Weeks    Status On-going      Additional Long Term Goals   Additional Long Term Goals Yes      PT LONG TERM GOAL #6   Title Pt will report no pulling sensation throughout arm during L shoulder abduction to allow improved comfort.    Time 4    Period Weeks    Status New    Target Date 11/10/20      PT LONG TERM GOAL #7   Title Pt will report a 75% improvement in scar tissue to allow improved comfort.    Time 4    Period Weeks     Status New    Target Date 11/10/20                 Plan - 10/13/20 1401    Clinical Impression Statement Assessed pt's progress towards goals in therapy. Pt has met ROM goals for therapy but is having a pulling sensation at end range abduction due to numerous cords in left upper arm from axilla to antecubital fossa. Added a new goal to address this. Also pt has increased scar tissue in left axilla so added a goal to address this. Will focus on cording, scar mobilization and MLD going forward. Pt would benefit from additional skilled PT services to address this.    PT Frequency 2x / week    PT Duration 4 weeks    PT Treatment/Interventions ADLs/Self Care Home Management;Patient/family education;Therapeutic exercise;Manual techniques;Manual lymph drainage;Compression bandaging;Scar mobilization;Passive range of motion;Taping;Vasopneumatic Device    PT Next Visit Plan MFR to cording, MLD to L breast and seroma, scar moblization- do SOZO screening at last appt before her next surgery- cancel appt in Dec and add one 3 months from her surgery date    Consulted and Agree with Plan of Care Patient           Patient will benefit from skilled therapeutic intervention in order to improve the following deficits and impairments:  Postural dysfunction, Decreased knowledge of precautions, Increased edema, Impaired UE functional use, Impaired flexibility, Increased fascial restricitons, Decreased range of motion, Decreased scar mobility  Visit Diagnosis: Stiffness of left shoulder, not elsewhere classified  Aftercare following surgery for neoplasm  Localized edema  Abnormal posture  Malignant neoplasm of upper-outer quadrant of left female breast, unspecified estrogen receptor status ([place])     Problem List Patient Active Problem List   Diagnosis Date Noted  . S/P mastectomy, left 08/18/2020  . Malignant neoplasm of upper-outer quadrant of left breast in female, estrogen receptor positive  ([place]) 04/13/2020    Allyson Sabal [facility] 10/13/2020, 2:06 PM  [place] [phone]  [place], [place], [address] Phone: [phone]   Fax:  [phone]  Name: Tracy Guerra MRN: [id number] Date of Birth: [birth year]  Manus Gunning, PT 10/13/20 2:06 PM

## 2020-10-18 ENCOUNTER — Encounter: Payer: Self-pay | Admitting: General Practice

## 2020-10-18 NOTE — Progress Notes (Signed)
[facility] Spiritual Care Note  Left voicemail of support and care, encouraging callback.   [place], [place], [facility] Pager [phone] Voicemail [phone]

## 2020-10-19 ENCOUNTER — Other Ambulatory Visit: Payer: Self-pay

## 2020-10-19 ENCOUNTER — Encounter: Payer: Self-pay | Admitting: Physical Therapy

## 2020-10-19 ENCOUNTER — Ambulatory Visit: Payer: PRIVATE HEALTH INSURANCE | Attending: General Surgery | Admitting: Physical Therapy

## 2020-10-19 DIAGNOSIS — M25612 Stiffness of left shoulder, not elsewhere classified: Secondary | ICD-10-CM

## 2020-10-19 DIAGNOSIS — R600 Localized edema: Secondary | ICD-10-CM | POA: Diagnosis present

## 2020-10-19 DIAGNOSIS — Z483 Aftercare following surgery for neoplasm: Secondary | ICD-10-CM | POA: Diagnosis present

## 2020-10-19 NOTE — Therapy (Signed)
[place], [place], [address] Phone: [phone]   Fax:  [phone]  Physical Therapy Treatment  Patient Details  Name: Tracy Guerra MRN: [id number] Date of Birth: 1976/12/05 Referring Provider (PT): Donne Hazel   Encounter Date: 10/19/2020   PT End of Session - 10/19/20 1002    Visit Number 10    Number of Visits 18    Date for PT Re-Evaluation 11/10/20    PT Start Time 0910   pt arrived late   PT Stop Time 0953    PT Time Calculation (min) 43 min    Activity Tolerance Patient tolerated treatment well    Behavior During Therapy [facility] for tasks assessed/performed           Past Medical History:  Diagnosis Date  . Complication of anesthesia    slow to wake  . Hypothyroidism     Past Surgical History:  Procedure Laterality Date  . BREAST RECONSTRUCTION WITH PLACEMENT OF TISSUE EXPANDER AND ALLODERM Left 08/18/2020   Procedure: LEFT BREAST RECONSTRUCTION WITH PLACEMENT OF TISSUE EXPANDER AND ALLODERM;  Surgeon: Irene Limbo, MD;  Location: [place];  Service: Plastics;  Laterality: Left;  . NASAL SEPTUM SURGERY    . NIPPLE SPARING MASTECTOMY WITH SENTINEL LYMPH NODE BIOPSY Left 08/18/2020   Procedure: LEFT NIPPLE SPARING MASTECTOMY WITH SENTINEL LYMPH NODE BIOPSY;  Surgeon: Rolm Bookbinder, MD;  Location: [place];  Service: General;  Laterality: Left;  RNFA, PEC BLOCK  . vaginal polyp removed      There were no vitals filed for this visit.   Subjective Assessment - 10/19/20 0911    Subjective My shoulder was fine. The cording is similar. I feel like maybe the cord has released a little bit.    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 No/denies    Pain Score 0-No pain                             [facility]  Adult PT Treatment/Exercise - 10/19/20 0001      Manual Therapy   Myofascial Release to cording at left upper arm from axilla to antecubital fossa- only 1 small cord palpable at axilla but no other cords palpable    Manual Lymphatic Drainage (MLD) short neck, 5 diaphragmatic breaths, right axillary nodes and establishment of interaxillary pathway, left inguinal nodes and establishment of axillo inguinal pathway, L breast and L axilla moving fluid towards pathways then retracing all steps                       PT Long Term Goals - 10/13/20 1312      PT LONG TERM GOAL #1   Title Pt will return to baseline ROM measurements to allow pt to return to PLOF.    Time 4    Period Weeks    Status Achieved      PT LONG TERM GOAL #2   Title Pt will demonstrate 165 degrees of left shoulder flexion to allow her to reach items overhead.    Baseline 165    Time 4    Period Weeks    Status Achieved      PT LONG TERM GOAL #3   Title Pt will demonstrate 165 degrees  of left shoulder abduction to allow her to reach out to the side.    Baseline 175    Time 4    Period Weeks    Status Achieved      PT LONG TERM GOAL #4   Title Pt will report a 75% improvement in discomfort in left axilla caused by seroma to allow improved comfort.    Baseline 85% improvement    Time 4    Period Weeks    Status Achieved      PT LONG TERM GOAL #5   Title Pt will demonstrate decreased left breast swelling as visualized by decreased pore size to decrease risk of swelling.    Baseline pores are enlarged on left inferior breast; 10/13/20- pores are still slightly enlarged but improving    Time 4    Period Weeks    Status On-going      Additional Long Term Goals   Additional Long Term Goals Yes      PT LONG TERM GOAL #6   Title Pt will report no pulling sensation throughout arm during L shoulder abduction to allow improved comfort.    Time 4    Period Weeks    Status New    Target Date 11/10/20        PT LONG TERM GOAL #7   Title Pt will report a 75% improvement in scar tissue to allow improved comfort.    Time 4    Period Weeks    Status New    Target Date 11/10/20                 Plan - 10/19/20 1002    Clinical Impression Statement Pt feels that her cording has improved from last session. Only 1 small cord palpable in left axilla today which is an improvement from last session in which numerous cords were palpable. Minimal stretch from cording felt at end range. Continued with MLD to L breast to reduce swelling.    PT Frequency 2x / week    PT Duration 4 weeks    PT Treatment/Interventions ADLs/Self Care Home Management;Patient/family education;Therapeutic exercise;Manual techniques;Manual lymph drainage;Compression bandaging;Scar mobilization;Passive range of motion;Taping;Vasopneumatic Device    PT Next Visit Plan MFR to cording, MLD to L breast and seroma, scar moblization- do SOZO screening at last appt before her next surgery- cancel appt in Dec and add one 3 months from her surgery date    PT Home Exercise Plan post op breast exercises    Consulted and Agree with Plan of Care Patient           Patient will benefit from skilled therapeutic intervention in order to improve the following deficits and impairments:  Postural dysfunction, Decreased knowledge of precautions, Increased edema, Impaired UE functional use, Impaired flexibility, Increased fascial restricitons, Decreased range of motion, Decreased scar mobility  Visit Diagnosis: Stiffness of left shoulder, not elsewhere classified  Aftercare following surgery for neoplasm  Localized edema     Problem List Patient Active Problem List   Diagnosis Date Noted  . S/P mastectomy, left 08/18/2020  . Malignant neoplasm of upper-outer quadrant of left breast in female, estrogen receptor positive ([place]) 04/13/2020    Allyson Sabal [facility] 10/19/2020, 10:04 AM  [place], [place], [address] Phone: [phone]   Fax:  [phone]  Name: Tracy Guerra MRN: [id number] Date of Birth: 04/04/1976  Manus Gunning, PT 10/19/20 10:05 AM

## 2020-10-19 NOTE — H&P (Signed)
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  HPI  8.5 weeks post op. Course complicated by axillary seroma drained x 6. Scheduled for second stage breast reconstruction next month.  Presented following screening MMG 02/2020 with left breast calcifications. Diagnostic mammogram on 03/02/20 showed left breast calcifications spanning 5.8 cm. 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  MRI demonstrated the two sites of biopsy proven disease left UOQ with NME extending between them suspicious for invasive disease. Second look US showed no discrete mass. MR guided biopsies completed labeled left UO showed IDC with DCIS, ER/PR+, Her2-  and left lower outer showed DCIS with calcs and necrosis high grade.  Oncotype on core bx 22/8% Oncology discussed systemic chemotherapy vs ovarian function suppression. Maintained on tamoxifen prior to surgery. Met with Oncology 9.9.21 and plan continue tamoxifen.  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  Mother with history DCIS, underwent lumpectomy/RT. Cousin with ovarian ca. Genetics negative  Prior 32 C/D. 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     Heart sounds: Normal heart sounds.  Pulmonary:     Effort: Pulmonary effort is normal.     Breath sounds: Normal breath sounds.   Abd: soft no hernias  Chest: scars maturing,  edema mastectomy flap continues to diminish Left axilla with very small swelling no cellulitis SN to nipple R 24.5 L 22 cm BW R 15.5  Nipple to IMF R 8.5 L  Assessment:     Left breast ca UOQ ER+ S/p left NSM, SLN, prepectoral TE/ADM (Alloderm) reconstruction    Plan:     Hold tamoxifen week prior to surgery.  Plan removal  left chest tissue expander and placement implant, right breast mastopexy.   As in prepectoral position recommend silicone implants , HCG or capacity filled implants. Reviewed saline vs silicone, shaped vs round.HCG or capacity filled silicone implants may offer reduced risk visible rippling. Reviewed MRI or USsurveillance for rupture with silicone implants. Reviewed 4th generation, capacity filled 4th generation, and HCG implants vs saline implants. Reviewed risks AP flipping that may be more noticeable with 5th generation implants, may require surgery to correct.Reviewed textured vs smooth, former associated with ALCL risk.Reviewed size in part guided by width chest, cannot assure her cup size.Patient has elected for silicone plan smooth round/  Over right chest plan right mastopexy. Reviewed anchor type scars possible drain. Discussed recurrent ptosis and NAC asymmetry over time, aging, reviewed risks NAC necrosis. Have previously discussed optio right augmentation if she desires larger volume and more upper pole fullness which will be present over left implant reconstruction. Declines augmentation. Goal to preserve right breast volume, counseled do not anticipate significant change in cup size with her mastopexy, goal to preserve volume and lift breast.   Reviewed purpose of fat grafting to aid with contour limit visible rippling. Reviewed donor site pain need for compression variable take of graft fat necrosis that presents as masses.Declines at this time.  Completed ASPS consent for implant placement including physician patient checklist.  Rx for tramadol, Bactrim and Robaxin given.  Natrelle 133S FV-12 T-400 ml tissue expander placed,  fill volume 390 ml saline

## 2020-10-22 ENCOUNTER — Encounter: Payer: PRIVATE HEALTH INSURANCE | Admitting: Rehabilitation

## 2020-10-26 ENCOUNTER — Other Ambulatory Visit: Payer: Self-pay

## 2020-10-26 ENCOUNTER — Ambulatory Visit: Payer: PRIVATE HEALTH INSURANCE | Admitting: Physical Therapy

## 2020-10-26 ENCOUNTER — Encounter: Payer: Self-pay | Admitting: Physical Therapy

## 2020-10-26 DIAGNOSIS — R600 Localized edema: Secondary | ICD-10-CM

## 2020-10-26 DIAGNOSIS — M25612 Stiffness of left shoulder, not elsewhere classified: Secondary | ICD-10-CM | POA: Diagnosis not present

## 2020-10-26 DIAGNOSIS — Z483 Aftercare following surgery for neoplasm: Secondary | ICD-10-CM

## 2020-10-26 NOTE — Therapy (Signed)
[place], [place], [address] Phone: [phone]   Fax:  [phone]  Physical Therapy Treatment  Patient Details  Name: Tracy Guerra MRN: [id number] Date of Birth: 01/24/1976 Referring Provider (PT): Ave Filter Date: 10/26/2020   PT End of Session - 10/26/20 1459    Visit Number 11    Number of Visits 18    Date for PT Re-Evaluation 11/10/20    PT Start Time 1406    PT Stop Time 1456    PT Time Calculation (min) 50 min    Activity Tolerance Patient tolerated treatment well    Behavior During Therapy [facility] for tasks assessed/performed           Past Medical History:  Diagnosis Date  . Complication of anesthesia    slow to wake  . Hypothyroidism     Past Surgical History:  Procedure Laterality Date  . BREAST RECONSTRUCTION WITH PLACEMENT OF TISSUE EXPANDER AND ALLODERM Left 08/18/2020   Procedure: LEFT BREAST RECONSTRUCTION WITH PLACEMENT OF TISSUE EXPANDER AND ALLODERM;  Surgeon: Irene Limbo, MD;  Location: [place];  Service: Plastics;  Laterality: Left;  . NASAL SEPTUM SURGERY    . NIPPLE SPARING MASTECTOMY WITH SENTINEL LYMPH NODE BIOPSY Left 08/18/2020   Procedure: LEFT NIPPLE SPARING MASTECTOMY WITH SENTINEL LYMPH NODE BIOPSY;  Surgeon: Rolm Bookbinder, MD;  Location: [place];  Service: General;  Laterality: Left;  RNFA, PEC BLOCK  . vaginal polyp removed      There were no vitals filed for this visit.   Subjective Assessment - 10/26/20 1408    Subjective I started for the first time feeling pain. I missed Friday's appointment because I was not feeling well.    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 No/denies    Pain Score 0-No pain              [facility] PT Assessment - 10/26/20 0001       Observation/Other Assessments   Observations large palpable possible seroma present in left axilla                          [facility] Adult PT Treatment/Exercise - 10/26/20 0001      Manual Therapy   Myofascial Release to cording from axilla to antecubital fossa - numerous cords palapable- at least 7. No releases noted but cords did become less palapable by end of session    Passive ROM to left shoulder in to flexion and abduction with prolonged holds                       PT Long Term Goals - 10/13/20 1312      PT LONG TERM GOAL #1   Title Pt will return to baseline ROM measurements to allow pt to return to PLOF.    Time 4    Period Weeks    Status Achieved      PT LONG TERM GOAL #2   Title Pt will demonstrate 165 degrees of left shoulder flexion to allow her to reach items overhead.    Baseline 165    Time 4    Period Weeks    Status Achieved      PT LONG TERM GOAL #3  Title Pt will demonstrate 165 degrees of left shoulder abduction to allow her to reach out to the side.    Baseline 175    Time 4    Period Weeks    Status Achieved      PT LONG TERM GOAL #4   Title Pt will report a 75% improvement in discomfort in left axilla caused by seroma to allow improved comfort.    Baseline 85% improvement    Time 4    Period Weeks    Status Achieved      PT LONG TERM GOAL #5   Title Pt will demonstrate decreased left breast swelling as visualized by decreased pore size to decrease risk of swelling.    Baseline pores are enlarged on left inferior breast; 10/13/20- pores are still slightly enlarged but improving    Time 4    Period Weeks    Status On-going      Additional Long Term Goals   Additional Long Term Goals Yes      PT LONG TERM GOAL #6   Title Pt will report no pulling sensation throughout arm during L shoulder abduction to allow improved comfort.    Time 4    Period Weeks    Status New    Target Date 11/10/20      PT LONG TERM GOAL  #7   Title Pt will report a 75% improvement in scar tissue to allow improved comfort.    Time 4    Period Weeks    Status New    Target Date 11/10/20                 Plan - 10/26/20 1500    Clinical Impression Statement Pt presents to PT today with pain in her axilla that is intermittent but she reports she has not been having that since surgery. She also feels the area under her axilla is more firm to the touch. Therapist was able to palpate possible seroma in left axilla that has increased in size since pt's last appointment. Pt to follow up with her plastic surgeon regarding this. There are also numerous cords palpable from axilla to antecubital fossa so spent increased time on this area to release cording.    PT Frequency 2x / week    PT Duration 4 weeks    PT Treatment/Interventions ADLs/Self Care Home Management;Patient/family education;Therapeutic exercise;Manual techniques;Manual lymph drainage;Compression bandaging;Scar mobilization;Passive range of motion;Taping;Vasopneumatic Device    PT Next Visit Plan see what dr said about seroma, MFR to cording, MLD to L breast and seroma, scar moblization- do SOZO screening at last appt before her next surgery- cancel appt in Dec and add one 3 months from her surgery date    PT Home Exercise Plan post op breast exercises    Consulted and Agree with Plan of Care Patient           Patient will benefit from skilled therapeutic intervention in order to improve the following deficits and impairments:  Postural dysfunction, Decreased knowledge of precautions, Increased edema, Impaired UE functional use, Impaired flexibility, Increased fascial restricitons, Decreased range of motion, Decreased scar mobility  Visit Diagnosis: Stiffness of left shoulder, not elsewhere classified  Aftercare following surgery for neoplasm  Localized edema     Problem List Patient Active Problem List   Diagnosis Date Noted  . S/P mastectomy, left  08/18/2020  . Malignant neoplasm of upper-outer quadrant of left breast in female, estrogen receptor positive ([place]) 04/13/2020  Allyson Sabal [facility] 10/26/2020, 3:05 PM  [place], [place], [address] Phone: [phone]   Fax:  [phone]  Name: Esther Bradstreet MRN: [id number] Date of Birth: Dec 29, 1975  Manus Gunning, PT 10/26/20 3:05 PM

## 2020-10-27 ENCOUNTER — Encounter: Payer: Self-pay | Admitting: Rehabilitation

## 2020-10-27 ENCOUNTER — Ambulatory Visit: Payer: PRIVATE HEALTH INSURANCE | Admitting: Podiatry

## 2020-10-28 ENCOUNTER — Other Ambulatory Visit: Payer: Self-pay

## 2020-10-28 ENCOUNTER — Encounter (HOSPITAL_BASED_OUTPATIENT_CLINIC_OR_DEPARTMENT_OTHER): Payer: Self-pay | Admitting: Plastic Surgery

## 2020-10-29 ENCOUNTER — Ambulatory Visit: Payer: PRIVATE HEALTH INSURANCE | Admitting: Rehabilitation

## 2020-11-02 ENCOUNTER — Other Ambulatory Visit: Payer: Self-pay

## 2020-11-02 ENCOUNTER — Other Ambulatory Visit (HOSPITAL_COMMUNITY): Payer: PRIVATE HEALTH INSURANCE

## 2020-11-02 ENCOUNTER — Encounter: Payer: PRIVATE HEALTH INSURANCE | Admitting: Physical Therapy

## 2020-11-02 ENCOUNTER — Encounter: Payer: Self-pay | Admitting: Physical Therapy

## 2020-11-02 ENCOUNTER — Ambulatory Visit: Payer: PRIVATE HEALTH INSURANCE | Admitting: Physical Therapy

## 2020-11-02 DIAGNOSIS — M25612 Stiffness of left shoulder, not elsewhere classified: Secondary | ICD-10-CM

## 2020-11-02 DIAGNOSIS — Z483 Aftercare following surgery for neoplasm: Secondary | ICD-10-CM

## 2020-11-02 NOTE — Therapy (Signed)
[place], [place], [address] Phone: [phone]   Fax:  [phone]  Physical Therapy Treatment  Patient Details  Name: Tracy Guerra MRN: [id number] Date of Birth: [birth year] Referring Provider (PT): Ave Filter Date: 11/02/2020   PT End of Session - 11/02/20 0958    Visit Number 12    Number of Visits 18    Date for PT Re-Evaluation 11/10/20    PT Start Time 0904    PT Stop Time 0958    PT Time Calculation (min) 54 min    Activity Tolerance Patient tolerated treatment well    Behavior During Therapy [facility] for tasks assessed/performed           Past Medical History:  Diagnosis Date  . Complication of anesthesia    slow to wake  . Hypothyroidism     Past Surgical History:  Procedure Laterality Date  . BREAST RECONSTRUCTION WITH PLACEMENT OF TISSUE EXPANDER AND ALLODERM Left 08/18/2020   Procedure: LEFT BREAST RECONSTRUCTION WITH PLACEMENT OF TISSUE EXPANDER AND ALLODERM;  Surgeon: Irene Limbo, MD;  Location: [place];  Service: Plastics;  Laterality: Left;  . NASAL SEPTUM SURGERY    . NIPPLE SPARING MASTECTOMY WITH SENTINEL LYMPH NODE BIOPSY Left 08/18/2020   Procedure: LEFT NIPPLE SPARING MASTECTOMY WITH SENTINEL LYMPH NODE BIOPSY;  Surgeon: Rolm Bookbinder, MD;  Location: [place];  Service: General;  Laterality: Left;  RNFA, PEC BLOCK  . vaginal polyp removed      There were no vitals filed for this visit.   Subjective Assessment - 11/02/20 0913    Subjective The cultures did not come back with anything. It is still red under the armpit and I am on antibiotics.    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 Yes    Pain Score 5     Pain Location Arm    Pain Orientation Left    Pain Descriptors / Indicators  Tightness    Pain Type Acute pain    Pain Onset 1 to 4 weeks ago    Pain Frequency Constant    Aggravating Factors  moving the arm                  L-DEX FLOWSHEETS - 11/02/20 0900      L-DEX LYMPHEDEMA SCREENING   Measurement Type Unilateral    L-DEX MEASUREMENT EXTREMITY Upper Extremity    DOMINANT SIDE Right    At Risk Side Left    BASELINE SCORE (UNILATERAL) -0.9    L-DEX SCORE (UNILATERAL) 0.6    VALUE CHANGE (UNILAT) 1.5                     [facility] Adult PT Treatment/Exercise - 11/02/20 0001      Manual Therapy   Myofascial Release to cording from axilla to antecubital fossa - numerous cords palapable. No releases noted but cords did become less palapable by end of session                       PT Long Term Goals - 10/13/20 1312      PT LONG TERM GOAL #1   Title Pt will return to baseline ROM measurements to allow pt to return to PLOF.    Time 4  Period Weeks    Status Achieved      PT LONG TERM GOAL #2   Title Pt will demonstrate 165 degrees of left shoulder flexion to allow her to reach items overhead.    Baseline 165    Time 4    Period Weeks    Status Achieved      PT LONG TERM GOAL #3   Title Pt will demonstrate 165 degrees of left shoulder abduction to allow her to reach out to the side.    Baseline 175    Time 4    Period Weeks    Status Achieved      PT LONG TERM GOAL #4   Title Pt will report a 75% improvement in discomfort in left axilla caused by seroma to allow improved comfort.    Baseline 85% improvement    Time 4    Period Weeks    Status Achieved      PT LONG TERM GOAL #5   Title Pt will demonstrate decreased left breast swelling as visualized by decreased pore size to decrease risk of swelling.    Baseline pores are enlarged on left inferior breast; 10/13/20- pores are still slightly enlarged but improving    Time 4    Period Weeks    Status On-going      Additional Long Term Goals   Additional Long  Term Goals Yes      PT LONG TERM GOAL #6   Title Pt will report no pulling sensation throughout arm during L shoulder abduction to allow improved comfort.    Time 4    Period Weeks    Status New    Target Date 11/10/20      PT LONG TERM GOAL #7   Title Pt will report a 75% improvement in scar tissue to allow improved comfort.    Time 4    Period Weeks    Status New    Target Date 11/10/20                 Plan - 11/02/20 1002    Clinical Impression Statement Pt was seen by her plastic surgeon last week because she began running a fever and the area of her seroma turned red. She has been placed on antiobiotics but the test came back negative for infection. No fluid could be pulled off. This was her last PT appointment prior to her implant placement surgery on the left and lift on the right. Focused on stetching area of cording from axilla to wrist with most time spent at antecubital fossa. Cording improved greatly today with treatment and pt felt improved comfort. Did SOZO screening today which did not show any subclinical lymphedema. There was a 1.5 change from baseline which is much less than the 6.5 change for subclinical lymphedema.    PT Frequency 2x / week    PT Duration 4 weeks    PT Treatment/Interventions ADLs/Self Care Home Management;Patient/family education;Therapeutic exercise;Manual techniques;Manual lymph drainage;Compression bandaging;Scar mobilization;Passive range of motion;Taping;Vasopneumatic Device    PT Next Visit Plan make sure pt has SOZO appt for mid Feb, reassess cording and ROM after surgery    PT Home Exercise Plan post op breast exercises    Consulted and Agree with Plan of Care Patient           Patient will benefit from skilled therapeutic intervention in order to improve the following deficits and impairments:  Postural dysfunction, Decreased knowledge of precautions, Increased edema, Impaired UE  functional use, Impaired flexibility, Increased  fascial restricitons, Decreased range of motion, Decreased scar mobility  Visit Diagnosis: Stiffness of left shoulder, not elsewhere classified  Aftercare following surgery for neoplasm     Problem List Patient Active Problem List   Diagnosis Date Noted  . S/P mastectomy, left 08/18/2020  . Malignant neoplasm of upper-outer quadrant of left breast in female, estrogen receptor positive ([place]) 04/13/2020    Allyson Sabal [facility] 11/02/2020, 12:13 PM  [place], [place], [address] Phone: [phone]   Fax:  [phone]  Name: Breely Panik MRN: [id number] Date of Birth: [birth year]  Manus Gunning, PT 11/02/20 12:13 PM

## 2020-11-02 NOTE — Progress Notes (Signed)

## 2020-11-04 ENCOUNTER — Other Ambulatory Visit (HOSPITAL_COMMUNITY)
Admission: RE | Admit: 2020-11-04 | Discharge: 2020-11-04 | Disposition: A | Discharge status: Home / Self Care | Payer: PRIVATE HEALTH INSURANCE | Source: Ambulatory Visit | Attending: Plastic Surgery | Admitting: Plastic Surgery

## 2020-11-04 DIAGNOSIS — Z20822 Contact with and (suspected) exposure to covid-19: Secondary | ICD-10-CM | POA: Insufficient documentation

## 2020-11-04 DIAGNOSIS — Z01812 Encounter for preprocedural laboratory examination: Secondary | ICD-10-CM | POA: Insufficient documentation

## 2020-11-05 ENCOUNTER — Other Ambulatory Visit: Payer: Self-pay

## 2020-11-05 ENCOUNTER — Encounter (HOSPITAL_BASED_OUTPATIENT_CLINIC_OR_DEPARTMENT_OTHER)
Admission: RE | Disposition: A | Discharge status: Home / Self Care | Payer: Self-pay | Source: Home / Self Care | Attending: Plastic Surgery

## 2020-11-05 ENCOUNTER — Ambulatory Visit (HOSPITAL_BASED_OUTPATIENT_CLINIC_OR_DEPARTMENT_OTHER)
Admission: RE | Admit: 2020-11-05 | Discharge: 2020-11-05 | Disposition: A | Discharge status: Home / Self Care | Payer: PRIVATE HEALTH INSURANCE | Attending: Plastic Surgery | Admitting: Plastic Surgery

## 2020-11-05 ENCOUNTER — Ambulatory Visit (HOSPITAL_BASED_OUTPATIENT_CLINIC_OR_DEPARTMENT_OTHER): Payer: PRIVATE HEALTH INSURANCE | Admitting: Anesthesiology

## 2020-11-05 ENCOUNTER — Encounter (HOSPITAL_BASED_OUTPATIENT_CLINIC_OR_DEPARTMENT_OTHER): Payer: Self-pay | Admitting: Plastic Surgery

## 2020-11-05 DIAGNOSIS — Z45812 Encounter for adjustment or removal of left breast implant: Secondary | ICD-10-CM | POA: Diagnosis not present

## 2020-11-05 DIAGNOSIS — Z170 Estrogen receptor positive status [ER+]: Secondary | ICD-10-CM | POA: Diagnosis not present

## 2020-11-05 DIAGNOSIS — C50412 Malignant neoplasm of upper-outer quadrant of left female breast: Secondary | ICD-10-CM | POA: Diagnosis not present

## 2020-11-05 DIAGNOSIS — Z9012 Acquired absence of left breast and nipple: Secondary | ICD-10-CM | POA: Insufficient documentation

## 2020-11-05 HISTORY — PX: MASTOPEXY: SHX5358

## 2020-11-05 HISTORY — PX: REMOVAL OF TISSUE EXPANDER AND PLACEMENT OF IMPLANT: SHX6457

## 2020-11-05 LAB — POCT PREGNANCY, URINE: Preg Test, Ur: NEGATIVE

## 2020-11-05 LAB — SARS CORONAVIRUS 2 (TAT 6-24 HRS): SARS Coronavirus 2: NEGATIVE

## 2020-11-05 SURGERY — REMOVAL, TISSUE EXPANDER, BREAST, WITH IMPLANT INSERTION
Anesthesia: General | Site: Breast | Laterality: Right

## 2020-11-05 MED ORDER — FENTANYL CITRATE (PF) 100 MCG/2ML IJ SOLN
INTRAMUSCULAR | Status: AC
  Filled 2020-11-05: qty 2

## 2020-11-05 MED ORDER — ONDANSETRON HCL 4 MG/2ML IJ SOLN
INTRAMUSCULAR | Status: AC
  Filled 2020-11-05: qty 2

## 2020-11-05 MED ORDER — PHENYLEPHRINE 40 MCG/ML (10ML) SYRINGE FOR IV PUSH (FOR BLOOD PRESSURE SUPPORT)
PREFILLED_SYRINGE | INTRAVENOUS | Status: AC
  Filled 2020-11-05: qty 10

## 2020-11-05 MED ORDER — VANCOMYCIN HCL IN DEXTROSE 1-5 GM/200ML-% IV SOLN
1000.0000 mg | INTRAVENOUS | Status: AC
  Administered 2020-11-05: 1000 mg via INTRAVENOUS

## 2020-11-05 MED ORDER — MEPERIDINE HCL 25 MG/ML IJ SOLN: 6.2500 mg | INTRAMUSCULAR | Status: DC | PRN

## 2020-11-05 MED ORDER — GABAPENTIN 300 MG PO CAPS
ORAL_CAPSULE | ORAL | Status: AC
  Filled 2020-11-05: qty 1

## 2020-11-05 MED ORDER — AMOXICILLIN-POT CLAVULANATE 875-125 MG PO TABS: 1.0000 | ORAL_TABLET | Freq: Two times a day (BID) | ORAL | 0 refills | Status: DC

## 2020-11-05 MED ORDER — AMISULPRIDE (ANTIEMETIC) 5 MG/2ML IV SOLN: 10.0000 mg | Freq: Once | INTRAVENOUS | Status: DC | PRN

## 2020-11-05 MED ORDER — HYDROMORPHONE HCL 1 MG/ML IJ SOLN
0.2500 mg | INTRAMUSCULAR | Status: DC | PRN
  Administered 2020-11-05: 0.25 mg via INTRAVENOUS

## 2020-11-05 MED ORDER — BUPIVACAINE-EPINEPHRINE (PF) 0.25% -1:200000 IJ SOLN
INTRAMUSCULAR | Status: AC
  Filled 2020-11-05: qty 60

## 2020-11-05 MED ORDER — FENTANYL CITRATE (PF) 100 MCG/2ML IJ SOLN
INTRAMUSCULAR | Status: DC | PRN
  Administered 2020-11-05: 25 ug via INTRAVENOUS
  Administered 2020-11-05 (×2): 50 ug via INTRAVENOUS
  Administered 2020-11-05: 25 ug via INTRAVENOUS
  Administered 2020-11-05: 50 ug via INTRAVENOUS

## 2020-11-05 MED ORDER — EPHEDRINE 5 MG/ML INJ
INTRAVENOUS | Status: AC
  Filled 2020-11-05: qty 10

## 2020-11-05 MED ORDER — GABAPENTIN 300 MG PO CAPS
300.0000 mg | ORAL_CAPSULE | ORAL | Status: AC
  Administered 2020-11-05: 300 mg via ORAL

## 2020-11-05 MED ORDER — DEXAMETHASONE SODIUM PHOSPHATE 10 MG/ML IJ SOLN
INTRAMUSCULAR | Status: AC
  Filled 2020-11-05: qty 1

## 2020-11-05 MED ORDER — KETOROLAC TROMETHAMINE 30 MG/ML IJ SOLN
INTRAMUSCULAR | Status: DC | PRN
  Administered 2020-11-05: 30 mg via INTRAVENOUS

## 2020-11-05 MED ORDER — PROPOFOL 10 MG/ML IV BOLUS
INTRAVENOUS | Status: DC | PRN
  Administered 2020-11-05: 150 mg via INTRAVENOUS

## 2020-11-05 MED ORDER — SUCCINYLCHOLINE CHLORIDE 200 MG/10ML IV SOSY
PREFILLED_SYRINGE | INTRAVENOUS | Status: AC
  Filled 2020-11-05: qty 10

## 2020-11-05 MED ORDER — CELECOXIB 200 MG PO CAPS
200.0000 mg | ORAL_CAPSULE | ORAL | Status: AC
  Administered 2020-11-05: 200 mg via ORAL

## 2020-11-05 MED ORDER — SCOPOLAMINE 1 MG/3DAYS TD PT72
MEDICATED_PATCH | TRANSDERMAL | Status: DC | PRN
  Administered 2020-11-05: 1 via TRANSDERMAL

## 2020-11-05 MED ORDER — CHLORHEXIDINE GLUCONATE CLOTH 2 % EX PADS: 6.0000 | MEDICATED_PAD | Freq: Once | CUTANEOUS | Status: DC

## 2020-11-05 MED ORDER — SODIUM CHLORIDE 0.9 % IV SOLN
INTRAVENOUS | Status: DC | PRN
  Administered 2020-11-05: 1000 mL

## 2020-11-05 MED ORDER — DEXAMETHASONE SODIUM PHOSPHATE 4 MG/ML IJ SOLN
INTRAMUSCULAR | Status: DC | PRN
  Administered 2020-11-05: 10 mg via INTRAVENOUS

## 2020-11-05 MED ORDER — CELECOXIB 200 MG PO CAPS
ORAL_CAPSULE | ORAL | Status: AC
  Filled 2020-11-05: qty 1

## 2020-11-05 MED ORDER — LIDOCAINE 2% (20 MG/ML) 5 ML SYRINGE
INTRAMUSCULAR | Status: AC
  Filled 2020-11-05: qty 5

## 2020-11-05 MED ORDER — ACETAMINOPHEN 500 MG PO TABS
1000.0000 mg | ORAL_TABLET | ORAL | Status: AC
  Administered 2020-11-05: 1000 mg via ORAL

## 2020-11-05 MED ORDER — OXYCODONE HCL 5 MG PO TABS: 5.0000 mg | ORAL_TABLET | Freq: Once | ORAL | Status: DC | PRN

## 2020-11-05 MED ORDER — VANCOMYCIN HCL IN DEXTROSE 1-5 GM/200ML-% IV SOLN
INTRAVENOUS | Status: AC
  Filled 2020-11-05: qty 200

## 2020-11-05 MED ORDER — OXYCODONE HCL 5 MG/5ML PO SOLN: 5.0000 mg | Freq: Once | ORAL | Status: DC | PRN

## 2020-11-05 MED ORDER — LIDOCAINE HCL (CARDIAC) PF 100 MG/5ML IV SOSY
PREFILLED_SYRINGE | INTRAVENOUS | Status: DC | PRN
  Administered 2020-11-05: 40 mg via INTRAVENOUS

## 2020-11-05 MED ORDER — PHENYLEPHRINE HCL (PRESSORS) 10 MG/ML IV SOLN
INTRAVENOUS | Status: DC | PRN
  Administered 2020-11-05: 80 ug via INTRAVENOUS
  Administered 2020-11-05: 40 ug via INTRAVENOUS
  Administered 2020-11-05: 80 ug via INTRAVENOUS
  Administered 2020-11-05: 40 ug via INTRAVENOUS
  Administered 2020-11-05: 80 ug via INTRAVENOUS

## 2020-11-05 MED ORDER — LACTATED RINGERS IV SOLN: INTRAVENOUS | Status: DC

## 2020-11-05 MED ORDER — DIPHENHYDRAMINE HCL 50 MG/ML IJ SOLN
INTRAMUSCULAR | Status: DC | PRN
  Administered 2020-11-05: 6.25 mg via INTRAVENOUS

## 2020-11-05 MED ORDER — HYDROMORPHONE HCL 1 MG/ML IJ SOLN
INTRAMUSCULAR | Status: AC
  Filled 2020-11-05: qty 0.5

## 2020-11-05 MED ORDER — MIDAZOLAM HCL 5 MG/5ML IJ SOLN
INTRAMUSCULAR | Status: DC | PRN
  Administered 2020-11-05: 2 mg via INTRAVENOUS

## 2020-11-05 MED ORDER — ONDANSETRON HCL 4 MG/2ML IJ SOLN
INTRAMUSCULAR | Status: DC | PRN
  Administered 2020-11-05: 4 mg via INTRAVENOUS

## 2020-11-05 MED ORDER — PROPOFOL 500 MG/50ML IV EMUL
INTRAVENOUS | Status: DC | PRN
  Administered 2020-11-05: 25 ug/kg/min via INTRAVENOUS

## 2020-11-05 MED ORDER — PROMETHAZINE HCL 25 MG/ML IJ SOLN: 6.2500 mg | INTRAMUSCULAR | Status: DC | PRN

## 2020-11-05 MED ORDER — MIDAZOLAM HCL 2 MG/2ML IJ SOLN
INTRAMUSCULAR | Status: AC
  Filled 2020-11-05: qty 2

## 2020-11-05 MED ORDER — BUPIVACAINE-EPINEPHRINE 0.25% -1:200000 IJ SOLN
INTRAMUSCULAR | Status: DC | PRN
  Administered 2020-11-05: 20 mL

## 2020-11-05 MED ORDER — ACETAMINOPHEN 500 MG PO TABS
ORAL_TABLET | ORAL | Status: AC
  Filled 2020-11-05: qty 2

## 2020-11-05 MED ORDER — DIPHENHYDRAMINE HCL 50 MG/ML IJ SOLN
INTRAMUSCULAR | Status: AC
  Filled 2020-11-05: qty 1

## 2020-11-05 SURGICAL SUPPLY — 108 items
ADH SKN CLS APL DERMABOND .7 (GAUZE/BANDAGES/DRESSINGS) ×6
APL PRP STRL LF DISP 70% ISPRP (MISCELLANEOUS) ×6
BAG DECANTER FOR FLEXI CONT (MISCELLANEOUS) ×8 IMPLANT
BINDER ABDOMINAL 10 UNV 27-48 (MISCELLANEOUS) IMPLANT
BINDER ABDOMINAL 12 SM 30-45 (SOFTGOODS) IMPLANT
BINDER BREAST LRG (GAUZE/BANDAGES/DRESSINGS) ×4 IMPLANT
BINDER BREAST MEDIUM (GAUZE/BANDAGES/DRESSINGS) IMPLANT
BINDER BREAST XLRG (GAUZE/BANDAGES/DRESSINGS) IMPLANT
BINDER BREAST XXLRG (GAUZE/BANDAGES/DRESSINGS) IMPLANT
BLADE SURG 10 STRL SS (BLADE) ×8 IMPLANT
BLADE SURG 11 STRL SS (BLADE) IMPLANT
BLADE SURG 15 STRL LF DISP TIS (BLADE) IMPLANT
BLADE SURG 15 STRL SS (BLADE)
BNDG GAUZE ELAST 4 BULKY (GAUZE/BANDAGES/DRESSINGS) ×8 IMPLANT
CANISTER LIPO FAT HARVEST (MISCELLANEOUS) IMPLANT
CANISTER SUCT 1200ML W/VALVE (MISCELLANEOUS) ×4 IMPLANT
CHLORAPREP W/TINT 26 (MISCELLANEOUS) ×8 IMPLANT
COVER BACK TABLE 60X90IN (DRAPES) ×4 IMPLANT
COVER MAYO STAND STRL (DRAPES) ×4 IMPLANT
COVER WAND RF STERILE (DRAPES) IMPLANT
DECANTER SPIKE VIAL GLASS SM (MISCELLANEOUS) IMPLANT
DERMABOND ADVANCED (GAUZE/BANDAGES/DRESSINGS) ×2
DERMABOND ADVANCED .7 DNX12 (GAUZE/BANDAGES/DRESSINGS) ×6 IMPLANT
DRAIN CHANNEL 15F RND FF W/TCR (WOUND CARE) IMPLANT
DRAIN CHANNEL 19F RND (DRAIN) IMPLANT
DRAPE INCISE IOBAN 66X45 STRL (DRAPES) ×4 IMPLANT
DRAPE SURG 17X23 STRL (DRAPES) ×4 IMPLANT
DRAPE TOP ARMCOVERS (MISCELLANEOUS) ×4 IMPLANT
DRAPE U-SHAPE 76X120 STRL (DRAPES) ×4 IMPLANT
DRAPE UTILITY XL STRL (DRAPES) ×4 IMPLANT
DRSG PAD ABDOMINAL 8X10 ST (GAUZE/BANDAGES/DRESSINGS) ×8 IMPLANT
DRSG TEGADERM 2-3/8X2-3/4 SM (GAUZE/BANDAGES/DRESSINGS) ×4 IMPLANT
ELECT BLADE 4.0 EZ CLEAN MEGAD (MISCELLANEOUS) ×4
ELECT COATED BLADE 2.86 ST (ELECTRODE) ×4 IMPLANT
ELECT REM PT RETURN 9FT ADLT (ELECTROSURGICAL) ×4
ELECTRODE BLDE 4.0 EZ CLN MEGD (MISCELLANEOUS) ×3 IMPLANT
ELECTRODE REM PT RTRN 9FT ADLT (ELECTROSURGICAL) ×3 IMPLANT
EVACUATOR SILICONE 100CC (DRAIN) IMPLANT
EXTRACTOR CANIST REVOLVE STRL (CANNISTER) IMPLANT
FUNNEL KELLER 2 DISP (MISCELLANEOUS) ×4 IMPLANT
GAUZE SPONGE 4X4 12PLY STRL (GAUZE/BANDAGES/DRESSINGS) IMPLANT
GAUZE SPONGE 4X4 12PLY STRL LF (GAUZE/BANDAGES/DRESSINGS) IMPLANT
GLOVE BIO SURGEON STRL SZ 6 (GLOVE) ×12 IMPLANT
GLOVE BIO SURGEON STRL SZ7 (GLOVE) ×4 IMPLANT
GLOVE BIOGEL PI IND STRL 7.0 (GLOVE) ×3 IMPLANT
GLOVE BIOGEL PI IND STRL 7.5 (GLOVE) ×3 IMPLANT
GLOVE BIOGEL PI INDICATOR 7.0 (GLOVE) ×1
GLOVE BIOGEL PI INDICATOR 7.5 (GLOVE) ×1
GLOVE SURG SS PI 7.0 STRL IVOR (GLOVE) ×4 IMPLANT
GOWN STRL REUS W/ TWL LRG LVL3 (GOWN DISPOSABLE) ×6 IMPLANT
GOWN STRL REUS W/TWL LRG LVL3 (GOWN DISPOSABLE) ×12 IMPLANT
IMPL BREAST P5.1XFULL RND 415 (Breast) ×3 IMPLANT
IMPL BRST P5.1XFULL RND 415CC (Breast) ×3 IMPLANT
IMPLANT BREAST GEL 415CC (Breast) ×4 IMPLANT
IV NS 1000ML (IV SOLUTION)
IV NS 1000ML BAXH (IV SOLUTION) IMPLANT
IV NS 500ML (IV SOLUTION)
IV NS 500ML BAXH (IV SOLUTION) IMPLANT
KIT FILL SYSTEM UNIVERSAL (SET/KITS/TRAYS/PACK) IMPLANT
LINER CANISTER 1000CC FLEX (MISCELLANEOUS) ×4 IMPLANT
MARKER SKIN DUAL TIP RULER LAB (MISCELLANEOUS) IMPLANT
NDL SAFETY ECLIPSE 18X1.5 (NEEDLE) IMPLANT
NEEDLE FILTER BLUNT 18X 1/2SAF (NEEDLE) ×1
NEEDLE FILTER BLUNT 18X1 1/2 (NEEDLE) ×3 IMPLANT
NEEDLE HYPO 18GX1.5 SHARP (NEEDLE)
NEEDLE HYPO 25X1 1.5 SAFETY (NEEDLE) ×4 IMPLANT
NS IRRIG 1000ML POUR BTL (IV SOLUTION) ×4 IMPLANT
PACK BASIN DAY SURGERY FS (CUSTOM PROCEDURE TRAY) ×4 IMPLANT
PAD ALCOHOL SWAB (MISCELLANEOUS) ×4 IMPLANT
PENCIL SMOKE EVACUATOR (MISCELLANEOUS) ×4 IMPLANT
PIN SAFETY STERILE (MISCELLANEOUS) IMPLANT
SHEET MEDIUM DRAPE 40X70 STRL (DRAPES) ×8 IMPLANT
SIZER BREAST REUSE 415CC (SIZER) ×4
SIZER BREAST REUSE 445CC (SIZER) ×4
SIZER BREAST REUSE 485CC (SIZER) ×4
SIZER BREAST REUSE SRF 450CC (SIZER) ×4
SIZER BRST REUSE 445CC (SIZER) ×3 IMPLANT
SIZER BRST REUSE 485CC (SIZER) ×3 IMPLANT
SIZER BRST REUSE P5.1X 415CC (SIZER) ×3 IMPLANT
SIZER BRST REUSE SRF 450CC (SIZER) ×3 IMPLANT
SLEEVE SCD COMPRESS KNEE MED (MISCELLANEOUS) ×4 IMPLANT
SPONGE LAP 18X18 RF (DISPOSABLE) ×12 IMPLANT
STAPLER VISISTAT 35W (STAPLE) ×4 IMPLANT
STRIP CLOSURE SKIN 1/2X4 (GAUZE/BANDAGES/DRESSINGS) ×4 IMPLANT
SUT ETHILON 2 0 FS 18 (SUTURE) IMPLANT
SUT MNCRL AB 4-0 PS2 18 (SUTURE) ×8 IMPLANT
SUT PDS AB 2-0 CT2 27 (SUTURE) ×4 IMPLANT
SUT SILK 2 0 SH (SUTURE) IMPLANT
SUT VIC AB 3-0 PS1 18 (SUTURE) ×12
SUT VIC AB 3-0 PS1 18XBRD (SUTURE) ×9 IMPLANT
SUT VIC AB 3-0 SH 27 (SUTURE) ×4
SUT VIC AB 3-0 SH 27X BRD (SUTURE) ×3 IMPLANT
SUT VICRYL 4-0 PS2 18IN ABS (SUTURE) ×8 IMPLANT
SYR 10ML LL (SYRINGE) IMPLANT
SYR 20ML LL LF (SYRINGE) ×4 IMPLANT
SYR 50ML LL SCALE MARK (SYRINGE) IMPLANT
SYR BULB IRRIG 60ML STRL (SYRINGE) IMPLANT
SYR CONTROL 10ML LL (SYRINGE) ×4 IMPLANT
SYR TB 1ML LL NO SAFETY (SYRINGE) ×4 IMPLANT
SYR TOOMEY IRRIG 70ML (MISCELLANEOUS)
SYRINGE TOOMEY IRRIG 70ML (MISCELLANEOUS) IMPLANT
TAPE MEASURE VINYL STERILE (MISCELLANEOUS) IMPLANT
TOWEL GREEN STERILE FF (TOWEL DISPOSABLE) ×8 IMPLANT
TUBE CONNECTING 20X1/4 (TUBING) ×8 IMPLANT
TUBING INFILTRATION IT-10001 (TUBING) IMPLANT
TUBING SET GRADUATE ASPIR 12FT (MISCELLANEOUS) ×4 IMPLANT
UNDERPAD 30X36 HEAVY ABSORB (UNDERPADS AND DIAPERS) ×8 IMPLANT
YANKAUER SUCT BULB TIP NO VENT (SUCTIONS) ×4 IMPLANT

## 2020-11-05 NOTE — Anesthesia Preprocedure Evaluation (Signed)
Anesthesia Evaluation  Patient identified by MRN, date of birth, ID band Patient awake    Reviewed: Allergy & Precautions, H&P , NPO status , Patient's Chart, lab work & pertinent test results, reviewed documented beta blocker date and time   History of Anesthesia Complications Negative for: history of anesthetic complications  Airway Mallampati: I  TM Distance: >3 FB Neck ROM: full    Dental  (+) Teeth Intact   Pulmonary neg pulmonary ROS,    breath sounds clear to auscultation       Cardiovascular negative cardio ROS   Rhythm:regular Rate:Normal     Neuro/Psych negative neurological ROS  negative psych ROS   GI/Hepatic negative GI ROS, Neg liver ROS,   Endo/Other  Hypothyroidism   Renal/GU negative Renal ROS     Musculoskeletal   Abdominal   Peds  Hematology negative hematology ROS (+)   Anesthesia Other Findings Breast Cancer  Reproductive/Obstetrics                             Anesthesia Physical  Anesthesia Plan  ASA: II  Anesthesia Plan: General   Post-op Pain Management:    Induction: Intravenous  PONV Risk Score and Plan: 3 and Ondansetron, Dexamethasone, Midazolam and Treatment may vary due to age or medical condition  Airway Management Planned: LMA  Additional Equipment:   Intra-op Plan:   Post-operative Plan: Extubation in OR  Informed Consent: I have reviewed the patients History and Physical, chart, labs and discussed the procedure including the risks, benefits and alternatives for the proposed anesthesia with the patient or authorized representative who has indicated his/her understanding and acceptance.     Dental advisory given  Plan Discussed with: CRNA  Anesthesia Plan Comments:         Anesthesia Quick Evaluation

## 2020-11-05 NOTE — Anesthesia Postprocedure Evaluation (Signed)
Anesthesia Post Note  Patient: Tracy Guerra  Procedure(s) Performed: REMOVAL OF LEFT TISSUE EXPANDER AND PLACEMENT OF IMPLANT (Left Breast) RIGHT MASTOPEXY (Right Breast)     Patient location during evaluation: PACU Anesthesia Type: General Level of consciousness: awake and alert Pain management: pain level controlled Vital Signs Assessment: post-procedure vital signs reviewed and stable Respiratory status: spontaneous breathing, nonlabored ventilation, respiratory function stable and patient connected to nasal cannula oxygen Cardiovascular status: blood pressure returned to baseline and stable Postop Assessment: no apparent nausea or vomiting Anesthetic complications: no   No complications documented.  Last Vitals:  Vitals:   11/05/20 1530 11/05/20 1545  BP: 115/78 (!) 112/94  Pulse: 76 76  Resp: 14 16  Temp:  36.6 C  SpO2: 100% 100%    Last Pain:  Vitals:   11/05/20 1545  PainSc: [place]

## 2020-11-05 NOTE — Interval H&P Note (Signed)
History and Physical Interval Note:  11/05/2020 10:36 AM  Tracy Guerra  has presented today for surgery, with the diagnosis of history breast cancer, acquired absence left breast.  The various methods of treatment have been discussed with the patient and family. After consideration of risks, benefits and other options for treatment, the patient has consented to removal left chest tissue expander and placement silicone implant, right breast mastopexy, possible incisions and drainage left axilla a surgical intervention.  The patient's history has been reviewed, patient examined, no change in status, stable for surgery.  I have reviewed the patient's chart and labs.  Questions were answered to the patient's satisfaction.     Arnoldo Hooker Bernie Fobes

## 2020-11-05 NOTE — Anesthesia Procedure Notes (Signed)
Procedure Name: LMA Insertion Date/Time: 11/05/2020 11:38 AM Performed by: Willa Frater, CRNA Pre-anesthesia Checklist: Patient identified, Emergency Drugs available, Suction available and Patient being monitored Patient Re-evaluated:Patient Re-evaluated prior to induction Oxygen Delivery Method: Circle system utilized Preoxygenation: Pre-oxygenation with 100% oxygen Induction Type: IV induction Ventilation: Mask ventilation without difficulty LMA: LMA inserted LMA Size: 4.0 Number of attempts: 1 Airway Equipment and Method: Bite block Placement Confirmation: positive ETCO2 Tube secured with: Tape Dental Injury: Teeth and Oropharynx as per pre-operative assessment

## 2020-11-05 NOTE — Discharge Instructions (Signed)
Next dose of Tylenol can be given at 5:00pm if needed. Next dose of NSAIDS (Ibuprofen/Motrin/Aleve) can be given at 8:00pm if needed.   Post Anesthesia Home Care Instructions  Activity: Get plenty of rest for the remainder of the day. A responsible individual must stay with you for 24 hours following the procedure.  For the next 24 hours, DO NOT: -Drive a car -Paediatric nurse -Drink alcoholic beverages -Take any medication unless instructed by your physician -Make any legal decisions or sign important papers.  Meals: Start with liquid foods such as gelatin or soup. Progress to regular foods as tolerated. Avoid greasy, spicy, heavy foods. If nausea and/or vomiting occur, drink only clear liquids until the nausea and/or vomiting subsides. Call your physician if vomiting continues.  Special Instructions/Symptoms: Your throat may feel dry or sore from the anesthesia or the breathing tube placed in your throat during surgery. If this causes discomfort, gargle with warm salt water. The discomfort should disappear within 24 hours.  If you had a scopolamine patch placed behind your ear for the management of post- operative nausea and/or vomiting:  1. The medication in the patch is effective for 72 hours, after which it should be removed.  Wrap patch in a tissue and discard in the trash. Wash hands thoroughly with soap and water. 2. You may remove the patch earlier than 72 hours if you experience unpleasant side effects which may include dry mouth, dizziness or visual disturbances. 3. Avoid touching the patch. Wash your hands with soap and water after contact with the patch.     Post Anesthesia Home Care Instructions  Activity: Get plenty of rest for the remainder of the day. A responsible individual must stay with you for 24 hours following the procedure.  For the next 24 hours, DO NOT: -Drive a car -Paediatric nurse -Drink alcoholic beverages -Take any medication unless instructed by  your physician -Make any legal decisions or sign important papers.  Meals: Start with liquid foods such as gelatin or soup. Progress to regular foods as tolerated. Avoid greasy, spicy, heavy foods. If nausea and/or vomiting occur, drink only clear liquids until the nausea and/or vomiting subsides. Call your physician if vomiting continues.  Special Instructions/Symptoms: Your throat may feel dry or sore from the anesthesia or the breathing tube placed in your throat during surgery. If this causes discomfort, gargle with warm salt water. The discomfort should disappear within 24 hours.  If you had a scopolamine patch placed behind your ear for the management of post- operative nausea and/or vomiting:  1. The medication in the patch is effective for 72 hours, after which it should be removed.  Wrap patch in a tissue and discard in the trash. Wash hands thoroughly with soap and water. 2. You may remove the patch earlier than 72 hours if you experience unpleasant side effects which may include dry mouth, dizziness or visual disturbances. 3. Avoid touching the patch. Wash your hands with soap and water after contact with the patch.

## 2020-11-05 NOTE — Op Note (Signed)
Operative Note   DATE OF OPERATION: 11.19.21  LOCATION: [facility]-outpatient  SURGICAL DIVISION: Plastic Surgery  PREOPERATIVE DIAGNOSES:  1. History breast cancer 2. Acquired absence breast   POSTOPERATIVE DIAGNOSES:  same  PROCEDURE:  1. Removal left chest tissue expander placement silicone implant 2. Right breast mastopexy  SURGEON: Irene Limbo MD MBA  ASSISTANT: none  ANESTHESIA:  General.   EBL: 40 ml  COMPLICATIONS: None immediate.   INDICATIONS FOR PROCEDURE:  The patient, Tracy Guerra, is a 44 y.o. female born on 05/04/1976, is here for staged breast reconstruction following left nipple sparing mastectomy with prepectoral tissue expander acellular dermis reconstruction.    FINDINGS: Right mastopexy 15 gComplete incorporation ADM noted. Natrelle Soft Touch Smooth Round Full Projection 415 ml implant placed in left chest. REF [id number]  DESCRIPTION OF PROCEDURE:  The patient's operative site was marked with the patient in the preoperative area including anterior axillary lines, chest midline, breast meridians. The location of new nipple areolar complex was markedon rightsymmetricwith left. With aid of Wise pattern marker, location of new nipple areolar complex and vertical limbs (6cm) were markedby displacement of breasts along meridian. The patient was taken to the operating room. SCDs were placed and IV antibiotics were given. The patient's operative site was prepped and draped in a sterile fashion. A time out was performed and all information was confirmed to be correct. Incision made in left chest inframammary fold scar and carried through superficial fascia and acellular dermis. Expander removed. Superior and medial capsulotomies performed. Scoring anterior capsule lower pole completed. Excision skin margin at inframammary fold completed. Sizer placed.   Over right breast, in supine position lateral limbs for resection marked. Areola incisedwith  38 mm diameter marker. Superior pedicle marked and de epithelialized. Pedicle developed to chest wall. Medial and lateral pillars elevated.Central lower pole breast tissue maintained on inferior pedicle dermoglandular pedicle. Caudal extent pedicle rotated beneath pedicle and secured to chest wall as auto augmentation flap with interrupted 2-0 PDS suture. Inferior dermoglandular pedicle also advanced superiorly beneath superior pedicle as auto augmentation flap and secured to chest wall with 2-0 PDS suture.Patient tailor tacked closed.  Patient brought to upright sitting position and assessed for symmetry. A Full Profile 415 ml implant selected for left chest. Patent returned to supine position. Right breastcavityirrigated and hemostasis obtained.Local anesthetic infiltrated. Closure completedwith3-0 vicryl to approximate dermis along inframammary fold and vertical limb. NAC inset with 4-0 vicryl in dermis. Skin closure completed with 4-0 monocryl subcuticular throughout.  Left chest cavity irrigated withsaline followed by betadine, Ancef, gentamicinsolution.Hemostasis ensured. The implant was placed inleftchest with aid Jake Michaelis funnel,andimplant orientation ensured. Closure completed with 3-0 vicryl to closesuperficial fascia and ADMover implant.4-0 vicryl used to close dermis followed by 4-0 monocryl subcuticular.  Tissue adhesive applied to incisions. Dry dressing and breast binder applied.The patient was allowed to wake from anesthesia, extubated and taken to the recovery room in satisfactory condition.   SPECIMENS: right mastopexy  DRAINS: none

## 2020-11-05 NOTE — Transfer of Care (Signed)
Immediate Anesthesia Transfer of Care Note  Patient: Tracy Guerra  Procedure(s) Performed: REMOVAL OF LEFT TISSUE EXPANDER AND PLACEMENT OF IMPLANT (Left Breast) RIGHT MASTOPEXY (Right Breast)  Patient Location: PACU  Anesthesia Type:General  Level of Consciousness: awake, drowsy and patient cooperative  Airway & Oxygen Therapy: Patient Spontanous Breathing and Patient connected to face mask oxygen  Post-op Assessment: Report given to RN and Post -op Vital signs reviewed and stable  Post vital signs: Reviewed and stable  Last Vitals:  Vitals Value Taken Time  BP    Temp    Pulse    Resp    SpO2      Last Pain: There were no vitals filed for this visit.       Complications: No complications documented.

## 2020-11-07 ENCOUNTER — Encounter (HOSPITAL_BASED_OUTPATIENT_CLINIC_OR_DEPARTMENT_OTHER): Payer: Self-pay | Admitting: Plastic Surgery

## 2020-11-08 LAB — SURGICAL PATHOLOGY

## 2020-11-09 NOTE — Addendum Note (Signed)
Addendum  created 11/09/20 2010 by Albertha Ghee, MD   [place] recorded in [place], [place] filed

## 2020-11-16 ENCOUNTER — Ambulatory Visit: Payer: PRIVATE HEALTH INSURANCE | Admitting: Physical Therapy

## 2020-11-23 ENCOUNTER — Other Ambulatory Visit: Payer: Self-pay

## 2020-11-23 ENCOUNTER — Encounter: Payer: Self-pay | Admitting: Physical Therapy

## 2020-11-23 ENCOUNTER — Ambulatory Visit: Payer: PRIVATE HEALTH INSURANCE | Attending: General Surgery | Admitting: Physical Therapy

## 2020-11-23 DIAGNOSIS — R600 Localized edema: Secondary | ICD-10-CM | POA: Insufficient documentation

## 2020-11-23 DIAGNOSIS — Z483 Aftercare following surgery for neoplasm: Secondary | ICD-10-CM | POA: Diagnosis present

## 2020-11-23 DIAGNOSIS — C50412 Malignant neoplasm of upper-outer quadrant of left female breast: Secondary | ICD-10-CM | POA: Diagnosis present

## 2020-11-23 DIAGNOSIS — M25612 Stiffness of left shoulder, not elsewhere classified: Secondary | ICD-10-CM | POA: Insufficient documentation

## 2020-11-23 DIAGNOSIS — R293 Abnormal posture: Secondary | ICD-10-CM | POA: Diagnosis present

## 2020-11-23 NOTE — Therapy (Signed)
[place], [place], [address] Phone: [phone]   Fax:  [phone]  Physical Therapy Treatment  Patient Details  Name: Tracy Guerra MRN: [id number] Date of Birth: 1976-11-18 Referring Provider (PT): Ave Filter Date: 11/23/2020   PT End of Session - 11/23/20 0957    Visit Number 13    Number of Visits 21    Date for PT Re-Evaluation 12/21/20    PT Start Time 0906    PT Stop Time 0956    PT Time Calculation (min) 50 min           Past Medical History:  Diagnosis Date  . Complication of anesthesia    slow to wake  . Hypothyroidism     Past Surgical History:  Procedure Laterality Date  . BREAST RECONSTRUCTION WITH PLACEMENT OF TISSUE EXPANDER AND ALLODERM Left 08/18/2020   Procedure: LEFT BREAST RECONSTRUCTION WITH PLACEMENT OF TISSUE EXPANDER AND ALLODERM;  Surgeon: Irene Limbo, MD;  Location: [place];  Service: Plastics;  Laterality: Left;  Marland Kitchen MASTOPEXY Right 11/05/2020   Procedure: RIGHT MASTOPEXY;  Surgeon: Irene Limbo, MD;  Location: [place];  Service: Plastics;  Laterality: Right;  . NASAL SEPTUM SURGERY    . NIPPLE SPARING MASTECTOMY WITH SENTINEL LYMPH NODE BIOPSY Left 08/18/2020   Procedure: LEFT NIPPLE SPARING MASTECTOMY WITH SENTINEL LYMPH NODE BIOPSY;  Surgeon: Rolm Bookbinder, MD;  Location: [place];  Service: General;  Laterality: Left;  RNFA, PEC BLOCK  . REMOVAL OF TISSUE EXPANDER AND PLACEMENT OF IMPLANT Left 11/05/2020   Procedure: REMOVAL OF LEFT TISSUE EXPANDER AND PLACEMENT OF IMPLANT;  Surgeon: Irene Limbo, MD;  Location: [place];  Service: Plastics;  Laterality: Left;  . vaginal polyp removed      There were no vitals filed for this visit.   Subjective Assessment - 11/23/20 0907    Subjective It has now been two weeks since my surgery. I am sore in my breasts. The dr  said not to work on my breast but to focus on ROM.    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 Yes    Pain Score 4     Pain Location Breast    Pain Orientation Right;Left    Pain Descriptors / Indicators Sore    Pain Type Surgical pain    Pain Onset 1 to 4 weeks ago    Pain Frequency Constant    Aggravating Factors  nothing    Pain Relieving Factors nothing    Multiple Pain Sites Yes    Pain Score 4    Pain Location Arm    Pain Orientation Left    Pain Descriptors / Indicators Sore    Pain Type Surgical pain    Pain Onset 1 to 4 weeks ago    Pain Frequency Intermittent    Aggravating Factors  moving the arm    Pain Relieving Factors resting    Effect of Pain on Daily Activities discomfort when moving arm              [facility] PT Assessment - 11/23/20 0001      AROM   Right Shoulder Flexion 170 Degrees    Right Shoulder ABduction 174 Degrees    Right Shoulder Internal Rotation 64 Degrees  Right Shoulder External Rotation 90 Degrees    Left Shoulder Flexion 149 Degrees    Left Shoulder ABduction 163 Degrees    Left Shoulder Internal Rotation 57 Degrees    Left Shoulder External Rotation 90 Degrees                         [facility] Adult PT Treatment/Exercise - 11/23/20 0001      Manual Therapy   Myofascial Release to cording from axilla down to antecubital fossa with several cords palpable that improved with stretching    Passive ROM to left shoulder in to flexion and abduction with prolonged holds                       PT Long Term Goals - 11/23/20 0958      PT LONG TERM GOAL #1   Title Pt will return to baseline ROM measurements to allow pt to return to PLOF.    Time 4    Period Weeks    Status Achieved      PT LONG TERM GOAL #2   Title Pt will demonstrate 165 degrees of left shoulder flexion to allow her to  reach items overhead.    Baseline 165    Time 4    Period Weeks    Status Achieved      PT LONG TERM GOAL #3   Title Pt will demonstrate 165 degrees of left shoulder abduction to allow her to reach out to the side.    Baseline 175    Time 4    Period Weeks    Status Achieved      PT LONG TERM GOAL #4   Title Pt will report a 75% improvement in discomfort in left axilla caused by seroma to allow improved comfort.    Baseline 85% improvement    Time 4    Period Weeks    Status Achieved      PT LONG TERM GOAL #5   Title Pt will demonstrate decreased left breast swelling as visualized by decreased pore size to decrease risk of swelling.    Baseline pores are enlarged on left inferior breast; 10/13/20- pores are still slightly enlarged but improving - 12/7- not working on swelling due to recent reconstruction    Time 4    Period Weeks    Status Deferred      PT LONG TERM GOAL #6   Title Pt will report no pulling sensation throughout arm during L shoulder abduction to allow improved comfort.    Time 4    Period Weeks    Status On-going      PT LONG TERM GOAL #7   Title Pt will report a 75% improvement in scar tissue to allow improved comfort.    Time 4    Period Weeks    Status On-going                 Plan - 11/23/20 0959    Clinical Impression Statement Pt returns to PT after undergoing breast reconstruction. Remeasured her bilateral shoulder ROM. Her L shoulder ROM is limited and cording is palpable in her axilla. Focused on improving cording today to help improve ROM. She also has an area of scar tissue in left axilla that is very hard. Pt would benefit from additional skilled PT visits to improve L shoulder ROM and decrease scar tissue.    PT Frequency 2x / week  PT Duration 4 weeks    PT Treatment/Interventions ADLs/Self Care Home Management;Patient/family education;Therapeutic exercise;Manual techniques;Manual lymph drainage;Compression bandaging;Scar  mobilization;Passive range of motion;Taping;Vasopneumatic Device    PT Next Visit Plan make sure pt has [facility] appt for mid Feb, reassess cording and ROM after surgery    PT Home Exercise Plan post op breast exercises    Consulted and Agree with Plan of Care Patient           Patient will benefit from skilled therapeutic intervention in order to improve the following deficits and impairments:  Postural dysfunction, Decreased knowledge of precautions, Increased edema, Impaired UE functional use, Impaired flexibility, Increased fascial restricitons, Decreased range of motion, Decreased scar mobility  Visit Diagnosis: Stiffness of left shoulder, not elsewhere classified  Aftercare following surgery for neoplasm  Localized edema  Abnormal posture  Malignant neoplasm of upper-outer quadrant of left female breast, unspecified estrogen receptor status ([place])     Problem List Patient Active Problem List   Diagnosis Date Noted  . S/P mastectomy, left 08/18/2020  . Malignant neoplasm of upper-outer quadrant of left breast in female, estrogen receptor positive ([place]) 04/13/2020    Allyson Sabal [facility] 11/23/2020, 10:02 AM  [address] [place], [place], [address] Phone: [phone]   Fax:  [phone]  Name: Hedy Garro MRN: [id number] Date of Birth: [birth year]  Manus Gunning, PT 11/23/20 10:02 AM

## 2020-11-25 ENCOUNTER — Ambulatory Visit: Payer: PRIVATE HEALTH INSURANCE | Admitting: Physical Therapy

## 2020-11-25 ENCOUNTER — Other Ambulatory Visit: Payer: Self-pay

## 2020-11-25 ENCOUNTER — Encounter: Payer: Self-pay | Admitting: Physical Therapy

## 2020-11-25 DIAGNOSIS — M25612 Stiffness of left shoulder, not elsewhere classified: Secondary | ICD-10-CM | POA: Diagnosis not present

## 2020-11-25 DIAGNOSIS — Z483 Aftercare following surgery for neoplasm: Secondary | ICD-10-CM

## 2020-11-25 NOTE — Therapy (Signed)
[place], [place], [address] Phone: [phone]   Fax:  [phone]  Physical Therapy Treatment  Patient Details  Name: Tracy Guerra MRN: [id number] Date of Birth: 18-Aug-1976 Referring Provider (PT): Ave Filter Date: 11/25/2020   PT End of Session - 11/25/20 1000    Visit Number 14    Number of Visits 21    Date for PT Re-Evaluation 12/21/20    PT Start Time 0906   pt arrived late   PT Stop Time 0958    PT Time Calculation (min) 52 min    Activity Tolerance Patient tolerated treatment well    Behavior During Therapy [facility] for tasks assessed/performed           Past Medical History:  Diagnosis Date  . Complication of anesthesia    slow to wake  . Hypothyroidism     Past Surgical History:  Procedure Laterality Date  . BREAST RECONSTRUCTION WITH PLACEMENT OF TISSUE EXPANDER AND ALLODERM Left 08/18/2020   Procedure: LEFT BREAST RECONSTRUCTION WITH PLACEMENT OF TISSUE EXPANDER AND ALLODERM;  Surgeon: Irene Limbo, MD;  Location: [place];  Service: Plastics;  Laterality: Left;  Marland Kitchen MASTOPEXY Right 11/05/2020   Procedure: RIGHT MASTOPEXY;  Surgeon: Irene Limbo, MD;  Location: [place];  Service: Plastics;  Laterality: Right;  . NASAL SEPTUM SURGERY    . NIPPLE SPARING MASTECTOMY WITH SENTINEL LYMPH NODE BIOPSY Left 08/18/2020   Procedure: LEFT NIPPLE SPARING MASTECTOMY WITH SENTINEL LYMPH NODE BIOPSY;  Surgeon: Rolm Bookbinder, MD;  Location: [place];  Service: General;  Laterality: Left;  RNFA, PEC BLOCK  . REMOVAL OF TISSUE EXPANDER AND PLACEMENT OF IMPLANT Left 11/05/2020   Procedure: REMOVAL OF LEFT TISSUE EXPANDER AND PLACEMENT OF IMPLANT;  Surgeon: Irene Limbo, MD;  Location: [place];  Service: Plastics;  Laterality: Left;  . vaginal polyp removed      There were no vitals filed for this  visit.   Subjective Assessment - 11/25/20 0907    Subjective I felt really good after our session on Monday. I think it has really helped the seroma.    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 Yes    Pain Score 4     Pain Location Breast    Pain Orientation Right;Left    Pain Descriptors / Indicators Sore    Pain Score 3    Pain Location Axilla    Pain Orientation Left    Pain Descriptors / Indicators Sore                             [facility] Adult PT Treatment/Exercise - 11/25/20 0001      Manual Therapy   Manual Therapy Myofascial release;Passive ROM;Edema management;Manual Lymphatic Drainage (MLD);Soft tissue mobilization    Soft tissue mobilization to area of seroma/scar tissue in left axilla    Myofascial Release to cording from axilla down to antecubital fossa with several cords palpable that improved with stretching    Passive ROM to left shoulder in to flexion and abduction with prolonged holds                       PT Long Term Goals - 11/23/20 6962  PT LONG TERM GOAL #1   Title Pt will return to baseline ROM measurements to allow pt to return to PLOF.    Time 4    Period Weeks    Status Achieved      PT LONG TERM GOAL #2   Title Pt will demonstrate 165 degrees of left shoulder flexion to allow her to reach items overhead.    Baseline 165    Time 4    Period Weeks    Status Achieved      PT LONG TERM GOAL #3   Title Pt will demonstrate 165 degrees of left shoulder abduction to allow her to reach out to the side.    Baseline 175    Time 4    Period Weeks    Status Achieved      PT LONG TERM GOAL #4   Title Pt will report a 75% improvement in discomfort in left axilla caused by seroma to allow improved comfort.    Baseline 85% improvement    Time 4    Period Weeks    Status Achieved      PT LONG  TERM GOAL #5   Title Pt will demonstrate decreased left breast swelling as visualized by decreased pore size to decrease risk of swelling.    Baseline pores are enlarged on left inferior breast; 10/13/20- pores are still slightly enlarged but improving - 12/7- not working on swelling due to recent reconstruction    Time 4    Period Weeks    Status Deferred      PT LONG TERM GOAL #6   Title Pt will report no pulling sensation throughout arm during L shoulder abduction to allow improved comfort.    Time 4    Period Weeks    Status On-going      PT LONG TERM GOAL #7   Title Pt will report a 75% improvement in scar tissue to allow improved comfort.    Time 4    Period Weeks    Status On-going                 Plan - 11/25/20 1001    Clinical Impression Statement Pt felt relief after last session and felt her seroma/scar tissue in left axilla was improving. Today this area was more firm than at the end of last session but the area itself felt smaller. Continued with soft tissue mobilization to area of seroma/scar tissue which softened with treatment today as well as ROM and myofascial release to cording that is present in left axilla. Only one cord palpable today.    PT Frequency 2x / week    PT Duration 4 weeks    PT Treatment/Interventions ADLs/Self Care Home Management;Patient/family education;Therapeutic exercise;Manual techniques;Manual lymph drainage;Compression bandaging;Scar mobilization;Passive range of motion;Taping;Vasopneumatic Device    PT Next Visit Plan make sure pt has [facility] appt for mid Feb, reassess cording and ROM after surgery    PT Home Exercise Plan post op breast exercises    Consulted and Agree with Plan of Care Patient           Patient will benefit from skilled therapeutic intervention in order to improve the following deficits and impairments:  Postural dysfunction,Decreased knowledge of precautions,Increased edema,Impaired UE functional use,Impaired  flexibility,Increased fascial restricitons,Decreased range of motion,Decreased scar mobility  Visit Diagnosis: Stiffness of left shoulder, not elsewhere classified  Aftercare following surgery for neoplasm     Problem List Patient Active Problem List   Diagnosis Date  Noted  . S/P mastectomy, left 08/18/2020  . Malignant neoplasm of upper-outer quadrant of left breast in female, estrogen receptor positive ([place]) 04/13/2020    Tracy Sabal [facility] 11/25/2020, 10:03 AM  [place] [address] [place], [place], [address] Phone: [phone]   Fax:  [phone]  Name: Kelliann Pendergraph MRN: [id number] Date of Birth: 10-01-1976  Manus Gunning, PT 11/25/20 10:03 AM

## 2020-11-29 ENCOUNTER — Ambulatory Visit: Payer: PRIVATE HEALTH INSURANCE

## 2020-11-29 ENCOUNTER — Other Ambulatory Visit: Payer: Self-pay

## 2020-11-29 DIAGNOSIS — Z483 Aftercare following surgery for neoplasm: Secondary | ICD-10-CM

## 2020-11-29 DIAGNOSIS — C50412 Malignant neoplasm of upper-outer quadrant of left female breast: Secondary | ICD-10-CM

## 2020-11-29 DIAGNOSIS — M25612 Stiffness of left shoulder, not elsewhere classified: Secondary | ICD-10-CM

## 2020-11-29 DIAGNOSIS — R293 Abnormal posture: Secondary | ICD-10-CM

## 2020-11-29 DIAGNOSIS — R600 Localized edema: Secondary | ICD-10-CM

## 2020-11-29 NOTE — Therapy (Signed)
[place], [place], [address] Phone: [phone]   Fax:  [phone]  Physical Therapy Treatment  Patient Details  Name: Tracy Guerra MRN: [id number] Date of Birth: October 10, 1976 Referring Provider (PT): Ave Filter Date: 11/29/2020   PT End of Session - 11/29/20 0951    Visit Number 15    Number of Visits 21    Date for PT Re-Evaluation 12/21/20    PT Start Time 0904    PT Stop Time 0950   shortened session today due to normal therapist sick and worked pt in   PT Time Calculation (min) 46 min    Activity Tolerance Patient tolerated treatment well    Behavior During Therapy [facility] for tasks assessed/performed           Past Medical History:  Diagnosis Date  . Complication of anesthesia    slow to wake  . Hypothyroidism     Past Surgical History:  Procedure Laterality Date  . BREAST RECONSTRUCTION WITH PLACEMENT OF TISSUE EXPANDER AND ALLODERM Left 08/18/2020   Procedure: LEFT BREAST RECONSTRUCTION WITH PLACEMENT OF TISSUE EXPANDER AND ALLODERM;  Surgeon: Irene Limbo, MD;  Location: [place];  Service: Plastics;  Laterality: Left;  Marland Kitchen MASTOPEXY Right 11/05/2020   Procedure: RIGHT MASTOPEXY;  Surgeon: Irene Limbo, MD;  Location: [place];  Service: Plastics;  Laterality: Right;  . NASAL SEPTUM SURGERY    . NIPPLE SPARING MASTECTOMY WITH SENTINEL LYMPH NODE BIOPSY Left 08/18/2020   Procedure: LEFT NIPPLE SPARING MASTECTOMY WITH SENTINEL LYMPH NODE BIOPSY;  Surgeon: Rolm Bookbinder, MD;  Location: [place];  Service: General;  Laterality: Left;  RNFA, PEC BLOCK  . REMOVAL OF TISSUE EXPANDER AND PLACEMENT OF IMPLANT Left 11/05/2020   Procedure: REMOVAL OF LEFT TISSUE EXPANDER AND PLACEMENT OF IMPLANT;  Surgeon: Irene Limbo, MD;  Location: [place];  Service: Plastics;  Laterality: Left;  . vaginal polyp  removed      There were no vitals filed for this visit.   Subjective Assessment - 11/29/20 0908    Subjective I feel a little tight in both shoulders today but I can tell overall it's getting better. I want to do some active stretching today to help me at home.    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 No/denies                             [facility] Adult PT Treatment/Exercise - 11/29/20 0001      Shoulder Exercises: Pulleys   Flexion 2 minutes      Shoulder Exercises: Therapy Ball   Flexion Both;10 reps   forward lean into end of stretch   ABduction Right;Left;10 reps   same side lean into of stretch     Manual Therapy   Manual Therapy Myofascial release;Passive ROM;Edema management;Manual Lymphatic Drainage (MLD)    Soft tissue mobilization to area of seroma/scar tissue in left axilla, seroma much reduced from when this therapist saw her last    Myofascial Release to cording at axilla, minimally visible today    Passive ROM to left shoulder in to flexion and abduction with prolonged holds  PT Long Term Goals - 11/23/20 0958      PT LONG TERM GOAL #1   Title Pt will return to baseline ROM measurements to allow pt to return to PLOF.    Time 4    Period Weeks    Status Achieved      PT LONG TERM GOAL #2   Title Pt will demonstrate 165 degrees of left shoulder flexion to allow her to reach items overhead.    Baseline 165    Time 4    Period Weeks    Status Achieved      PT LONG TERM GOAL #3   Title Pt will demonstrate 165 degrees of left shoulder abduction to allow her to reach out to the side.    Baseline 175    Time 4    Period Weeks    Status Achieved      PT LONG TERM GOAL #4   Title Pt will report a 75% improvement in discomfort in left  axilla caused by seroma to allow improved comfort.    Baseline 85% improvement    Time 4    Period Weeks    Status Achieved      PT LONG TERM GOAL #5   Title Pt will demonstrate decreased left breast swelling as visualized by decreased pore size to decrease risk of swelling.    Baseline pores are enlarged on left inferior breast; 10/13/20- pores are still slightly enlarged but improving - 12/7- not working on swelling due to recent reconstruction    Time 4    Period Weeks    Status Deferred      PT LONG TERM GOAL #6   Title Pt will report no pulling sensation throughout arm during L shoulder abduction to allow improved comfort.    Time 4    Period Weeks    Status On-going      PT LONG TERM GOAL #7   Title Pt will report a 75% improvement in scar tissue to allow improved comfort.    Time 4    Period Weeks    Status On-going                 Plan - 11/29/20 0951    Clinical Impression Statement Pt comes in reporting noticing overall tightness at Lt axilla is improvng and would like to learn some more active exercises to do at home so included AA/ROM exercises with the pulleys and ball roll up wall. Also encouraged her to continue end ROM stretching in doorway. Pt reports good stretches felt with both and will get a ball to cont this at home. Also continued manual therapy working to decrease myofascial tightness at end P/ROM of Lt shoulder. Much improvement since this therapist last saw pt when she still had larger seroma present. Her cording also seemed much improved today from last visits therapy note. Pt is pleased with progress thus far.    Stability/Clinical Decision Making Stable/Uncomplicated    Rehab Potential Excellent    PT Frequency 2x / week    PT Duration 4 weeks    PT Treatment/Interventions ADLs/Self Care Home Management;Patient/family education;Therapeutic exercise;Manual techniques;Manual lymph drainage;Compression bandaging;Scar mobilization;Passive range of  motion;Taping;Vasopneumatic Device    PT Next Visit Plan Cont to progress AA/A/ROM to Lt shoulder and cont manual therapy to Lt axilla and shoulder.    PT Home Exercise Plan post op breast exercises, roll ball up wall, end ROM stretching in doorway    Consulted and Agree with  Plan of Care Patient           Patient will benefit from skilled therapeutic intervention in order to improve the following deficits and impairments:  Postural dysfunction,Decreased knowledge of precautions,Increased edema,Impaired UE functional use,Impaired flexibility,Increased fascial restricitons,Decreased range of motion,Decreased scar mobility  Visit Diagnosis: Stiffness of left shoulder, not elsewhere classified  Aftercare following surgery for neoplasm  Localized edema  Abnormal posture  Malignant neoplasm of upper-outer quadrant of left female breast, unspecified estrogen receptor status ([place])     Problem List Patient Active Problem List   Diagnosis Date Noted  . S/P mastectomy, left 08/18/2020  . Malignant neoplasm of upper-outer quadrant of left breast in female, estrogen receptor positive ([place]) 04/13/2020    Otelia Limes, PTA 11/29/2020, 12:55 PM  [place], [place], [address] Phone: [phone]   Fax:  [phone]  Name: Tracy Guerra MRN: [id number] Date of Birth: [birth year]

## 2020-12-01 ENCOUNTER — Ambulatory Visit: Payer: PRIVATE HEALTH INSURANCE | Admitting: Physical Therapy

## 2020-12-01 ENCOUNTER — Other Ambulatory Visit: Payer: Self-pay

## 2020-12-01 ENCOUNTER — Encounter: Payer: Self-pay | Admitting: Physical Therapy

## 2020-12-01 DIAGNOSIS — Z483 Aftercare following surgery for neoplasm: Secondary | ICD-10-CM

## 2020-12-01 DIAGNOSIS — M25612 Stiffness of left shoulder, not elsewhere classified: Secondary | ICD-10-CM

## 2020-12-01 DIAGNOSIS — R293 Abnormal posture: Secondary | ICD-10-CM

## 2020-12-01 DIAGNOSIS — R600 Localized edema: Secondary | ICD-10-CM

## 2020-12-01 NOTE — Therapy (Signed)
[place], [place], [address] Phone: [phone]   Fax:  [phone]  Physical Therapy Treatment  Patient Details  Name: Tracy Guerra MRN: [id number] Date of Birth: Oct 21, 1976 Referring Provider (PT): Ave Filter Date: 12/01/2020   PT End of Session - 12/01/20 1133    Visit Number 16    Number of Visits 21    Date for PT Re-Evaluation 12/21/20    PT Start Time 1010    PT Stop Time 1129    PT Time Calculation (min) 79 min    Activity Tolerance Patient tolerated treatment well    Behavior During Therapy [facility] for tasks assessed/performed           Past Medical History:  Diagnosis Date  . Complication of anesthesia    slow to wake  . Hypothyroidism     Past Surgical History:  Procedure Laterality Date  . BREAST RECONSTRUCTION WITH PLACEMENT OF TISSUE EXPANDER AND ALLODERM Left 08/18/2020   Procedure: LEFT BREAST RECONSTRUCTION WITH PLACEMENT OF TISSUE EXPANDER AND ALLODERM;  Surgeon: Irene Limbo, MD;  Location: [place];  Service: Plastics;  Laterality: Left;  Marland Kitchen MASTOPEXY Right 11/05/2020   Procedure: RIGHT MASTOPEXY;  Surgeon: Irene Limbo, MD;  Location: [place];  Service: Plastics;  Laterality: Right;  . NASAL SEPTUM SURGERY    . NIPPLE SPARING MASTECTOMY WITH SENTINEL LYMPH NODE BIOPSY Left 08/18/2020   Procedure: LEFT NIPPLE SPARING MASTECTOMY WITH SENTINEL LYMPH NODE BIOPSY;  Surgeon: Rolm Bookbinder, MD;  Location: [place];  Service: General;  Laterality: Left;  RNFA, PEC BLOCK  . REMOVAL OF TISSUE EXPANDER AND PLACEMENT OF IMPLANT Left 11/05/2020   Procedure: REMOVAL OF LEFT TISSUE EXPANDER AND PLACEMENT OF IMPLANT;  Surgeon: Irene Limbo, MD;  Location: [place];  Service: Plastics;  Laterality: Left;  . vaginal polyp removed      There were no vitals filed for this visit.   Subjective  Assessment - 12/01/20 1011    Subjective My tightness is doing better but I do feel a little tight today. The plastic surgeon has given me full clearance.    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 Yes    Pain Score 3     Pain Location Breast    Pain Orientation Right    Pain Descriptors / Indicators Sore    Pain Type Surgical pain    Pain Onset 1 to 4 weeks ago    Pain Frequency Constant                             [facility] Adult PT Treatment/Exercise - 12/01/20 0001      Exercises   Exercises Other Exercises    Other Exercises  Instructed pt in entire Strength ABC program and issued handout for pt to be able to add to HEP- pt used 1 lb weights and did all exercises x 10 reps and held all stretches for 30 sec bilaterally. Therapist demo all exercises and provided verbal cues for correct form. Educated pt in proper way to progress by increasing number of sets prior to increasing weight.      Manual Therapy   Soft tissue mobilization to area  of seroma/scar tissue in left axilla, seroma softened greatly with treatment    Passive ROM to left shoulder in to flexion and abduction with prolonged holds                       PT Long Term Goals - 11/23/20 0958      PT LONG TERM GOAL #1   Title Pt will return to baseline ROM measurements to allow pt to return to PLOF.    Time 4    Period Weeks    Status Achieved      PT LONG TERM GOAL #2   Title Pt will demonstrate 165 degrees of left shoulder flexion to allow her to reach items overhead.    Baseline 165    Time 4    Period Weeks    Status Achieved      PT LONG TERM GOAL #3   Title Pt will demonstrate 165 degrees of left shoulder abduction to allow her to reach out to the side.    Baseline 175    Time 4    Period Weeks     Status Achieved      PT LONG TERM GOAL #4   Title Pt will report a 75% improvement in discomfort in left axilla caused by seroma to allow improved comfort.    Baseline 85% improvement    Time 4    Period Weeks    Status Achieved      PT LONG TERM GOAL #5   Title Pt will demonstrate decreased left breast swelling as visualized by decreased pore size to decrease risk of swelling.    Baseline pores are enlarged on left inferior breast; 10/13/20- pores are still slightly enlarged but improving - 12/7- not working on swelling due to recent reconstruction    Time 4    Period Weeks    Status Deferred      PT LONG TERM GOAL #6   Title Pt will report no pulling sensation throughout arm during L shoulder abduction to allow improved comfort.    Time 4    Period Weeks    Status On-going      PT LONG TERM GOAL #7   Title Pt will report a 75% improvement in scar tissue to allow improved comfort.    Time 4    Period Weeks    Status On-going                 Plan - 12/01/20 1134    Clinical Impression Statement Pt saw her plastic surgeon this morning and was cleared to return to exercise. Instructed pt in entire Strength ABC program using 1 lb weights and doing all exercises x 10 reps and holding all stretches x 30 seconds. Answered pt's questions regarding progressing of exercises. Afterwards spent some time on manual therapy over seroma which softened greatly by end of session.    PT Frequency 2x / week    PT Duration 4 weeks    PT Treatment/Interventions ADLs/Self Care Home Management;Patient/family education;Therapeutic exercise;Manual techniques;Manual lymph drainage;Compression bandaging;Scar mobilization;Passive range of motion;Taping;Vasopneumatic Device    PT Next Visit Plan assess indep with strength ABC, Cont to progress AA/A/ROM to Lt shoulder and cont manual therapy to Lt axilla and shoulder.    PT Home Exercise Plan post op breast exercises, roll ball up wall, end ROM  stretching in doorway, Strength ABC    Consulted and Agree with Plan of Care Patient  Patient will benefit from skilled therapeutic intervention in order to improve the following deficits and impairments:  Postural dysfunction,Decreased knowledge of precautions,Increased edema,Impaired UE functional use,Impaired flexibility,Increased fascial restricitons,Decreased range of motion,Decreased scar mobility  Visit Diagnosis: Stiffness of left shoulder, not elsewhere classified  Abnormal posture  Aftercare following surgery for neoplasm  Localized edema     Problem List Patient Active Problem List   Diagnosis Date Noted  . S/P mastectomy, left 08/18/2020  . Malignant neoplasm of upper-outer quadrant of left breast in female, estrogen receptor positive ([place]) 04/13/2020    Allyson Sabal [facility] 12/01/2020, 11:36 AM  [place], [place], [address] Phone: [phone]   Fax:  [phone]  Name: Tracy Guerra MRN: [id number] Date of Birth: 06-05-1976  Manus Gunning, PT 12/01/20 11:37 AM

## 2020-12-07 ENCOUNTER — Encounter: Payer: PRIVATE HEALTH INSURANCE | Admitting: Physical Therapy

## 2020-12-09 ENCOUNTER — Encounter: Payer: Self-pay | Admitting: *Deleted

## 2020-12-09 ENCOUNTER — Encounter: Payer: Self-pay | Admitting: Physical Therapy

## 2020-12-09 ENCOUNTER — Telehealth: Payer: Self-pay

## 2020-12-09 ENCOUNTER — Ambulatory Visit: Payer: PRIVATE HEALTH INSURANCE | Admitting: Physical Therapy

## 2020-12-09 ENCOUNTER — Other Ambulatory Visit: Payer: Self-pay

## 2020-12-09 DIAGNOSIS — R293 Abnormal posture: Secondary | ICD-10-CM

## 2020-12-09 DIAGNOSIS — C50412 Malignant neoplasm of upper-outer quadrant of left female breast: Secondary | ICD-10-CM

## 2020-12-09 DIAGNOSIS — Z483 Aftercare following surgery for neoplasm: Secondary | ICD-10-CM

## 2020-12-09 DIAGNOSIS — R600 Localized edema: Secondary | ICD-10-CM

## 2020-12-09 DIAGNOSIS — M25612 Stiffness of left shoulder, not elsewhere classified: Secondary | ICD-10-CM

## 2020-12-09 DIAGNOSIS — Z170 Estrogen receptor positive status [ER+]: Secondary | ICD-10-CM

## 2020-12-09 NOTE — Therapy (Signed)
[place], [place], [address] Phone: [phone]   Fax:  [phone]  Physical Therapy Treatment  Patient Details  Name: Ambermarie Seery MRN: [id number] Date of Birth: 03-07-1976 Referring Provider (PT): Ave Filter Date: 12/09/2020   PT End of Session - 12/09/20 1216    Visit Number 17    Number of Visits 21    Activity Tolerance Patient tolerated treatment well    Behavior During Therapy [facility] for tasks assessed/performed           Past Medical History:  Diagnosis Date  . Complication of anesthesia    slow to wake  . Hypothyroidism     Past Surgical History:  Procedure Laterality Date  . BREAST RECONSTRUCTION WITH PLACEMENT OF TISSUE EXPANDER AND ALLODERM Left 08/18/2020   Procedure: LEFT BREAST RECONSTRUCTION WITH PLACEMENT OF TISSUE EXPANDER AND ALLODERM;  Surgeon: Irene Limbo, MD;  Location: [place];  Service: Plastics;  Laterality: Left;  Marland Kitchen MASTOPEXY Right 11/05/2020   Procedure: RIGHT MASTOPEXY;  Surgeon: Irene Limbo, MD;  Location: [place];  Service: Plastics;  Laterality: Right;  . NASAL SEPTUM SURGERY    . NIPPLE SPARING MASTECTOMY WITH SENTINEL LYMPH NODE BIOPSY Left 08/18/2020   Procedure: LEFT NIPPLE SPARING MASTECTOMY WITH SENTINEL LYMPH NODE BIOPSY;  Surgeon: Rolm Bookbinder, MD;  Location: [place];  Service: General;  Laterality: Left;  RNFA, PEC BLOCK  . REMOVAL OF TISSUE EXPANDER AND PLACEMENT OF IMPLANT Left 11/05/2020   Procedure: REMOVAL OF LEFT TISSUE EXPANDER AND PLACEMENT OF IMPLANT;  Surgeon: Irene Limbo, MD;  Location: [place];  Service: Plastics;  Laterality: Left;  . vaginal polyp removed      There were no vitals filed for this visit.   Subjective Assessment - 12/09/20 1010    Subjective My endurance and stamina is really down. 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 Yes    Pain Score 4     Pain Location Breast    Pain Orientation Right;Left    Pain Descriptors / Indicators Sore    Pain Type Surgical pain    Pain Onset More than a month ago    Pain Frequency Intermittent    Aggravating Factors  Working    Pain Relieving Factors Rest                             [facility] Adult PT Treatment/Exercise - 12/09/20 0001      Manual Therapy   Manual Therapy Myofascial release;Neural Stretch;Passive ROM    Soft tissue mobilization To area of seroma/scar tissue in left axilla, seroma softened greatly with treatment; no significant seroma noted by end of treatment    Myofascial Release To left axilla at site of cording    Passive ROM To left shoulder focused on flexion and abduction.    Neural Stretch To left UE in supine to pt tolerance                       PT Long Term Goals - 11/23/20 [id number]  PT LONG TERM GOAL #1   Title Pt will return to baseline ROM measurements to allow pt to return to PLOF.    Time 4    Period Weeks    Status Achieved      PT LONG TERM GOAL #2   Title Pt will demonstrate 165 degrees of left shoulder flexion to allow her to reach items overhead.    Baseline 165    Time 4    Period Weeks    Status Achieved      PT LONG TERM GOAL #3   Title Pt will demonstrate 165 degrees of left shoulder abduction to allow her to reach out to the side.    Baseline 175    Time 4    Period Weeks    Status Achieved      PT LONG TERM GOAL #4   Title Pt will report a 75% improvement in discomfort in left axilla caused by seroma to allow improved comfort.    Baseline 85% improvement    Time 4    Period Weeks    Status Achieved      PT LONG TERM GOAL #5   Title Pt  will demonstrate decreased left breast swelling as visualized by decreased pore size to decrease risk of swelling.    Baseline pores are enlarged on left inferior breast; 10/13/20- pores are still slightly enlarged but improving - 12/7- not working on swelling due to recent reconstruction    Time 4    Period Weeks    Status Deferred      PT LONG TERM GOAL #6   Title Pt will report no pulling sensation throughout arm during L shoulder abduction to allow improved comfort.    Time 4    Period Weeks    Status On-going      PT LONG TERM GOAL #7   Title Pt will report a 75% improvement in scar tissue to allow improved comfort.    Time 4    Period Weeks    Status On-going                 Plan - 12/09/20 1216    Clinical Impression Statement Patient appears to have normal ROM in left shoulder but small cord is still visible and palpable in her axilla just superior to her incision. There is no visible edema in the axilla although pt reports she can feel it, mostly at the end of her day. Working a full day this week increased tightness in the left UE and may have worsened cording. She was encouraged to try wearing her compression sleeve for work to see if that helps. We discussed long term plans for continued exercise - 30 min cardio each day and yoga 2x/week. She plans to try doing her self-care independently for 1 month and then return for a re-check. If doing well at that time, D/C.    PT Treatment/Interventions ADLs/Self Care Home Management;Patient/family education;Therapeutic exercise;Manual techniques;Manual lymph drainage;Compression bandaging;Scar mobilization;Passive range of motion;Taping;Vasopneumatic Device    PT Next Visit Plan She plans to try doing her self-care independently for 1 month and then return for a re-check. If doing well at that time, D/C.    Consulted and Agree with Plan of Care Patient           Patient will benefit from skilled therapeutic intervention in  order to improve the following deficits and impairments:  Postural dysfunction,Decreased knowledge of precautions,Increased edema,Impaired UE functional use,Impaired flexibility,Increased fascial restricitons,Decreased range  of motion,Decreased scar mobility  Visit Diagnosis: Stiffness of left shoulder, not elsewhere classified  Abnormal posture  Aftercare following surgery for neoplasm  Localized edema  Malignant neoplasm of upper-outer quadrant of left female breast, unspecified estrogen receptor status ([place])     Problem List Patient Active Problem List   Diagnosis Date Noted  . S/P mastectomy, left 08/18/2020  . Malignant neoplasm of upper-outer quadrant of left breast in female, estrogen receptor positive ([place]) 04/13/2020   Annia Friendly, PT 12/09/20 12:21 PM  [place], [place], [address] Phone: [phone]   Fax:  [phone]  Name: Austin Pongratz MRN: [id number] Date of Birth: 03/22/1976

## 2020-12-09 NOTE — Telephone Encounter (Signed)
Scheduled appt per 12/23 sch msg - pt is aware of appt date and time   

## 2020-12-28 ENCOUNTER — Inpatient Hospital Stay: Payer: PRIVATE HEALTH INSURANCE | Attending: Hematology and Oncology

## 2020-12-28 ENCOUNTER — Other Ambulatory Visit: Payer: Self-pay

## 2020-12-28 NOTE — Progress Notes (Signed)
Nutrition Assessment:  Referral from [place], [place]  45 year old female with left breast cancer. S/p left mastectomy and reconstruction. Currently on tamoxifen.  Met with patient in clinic.  Patient with questions regarding diet and exercise following breast cancer.   Medications: reviewed  Labs: reviewed  Anthropometrics:   Height: 65.5 inches Weight: 144 lb  BMI: 23    NUTRITION DIAGNOSIS: Food and nutrition related knowledge deficit related to cancer diagnosis as evidenced by questions regarding healthy diet following cancer   INTERVENTION:  Reviewed recommendations from [place] and written information provided Discussed current evidenced regarding soy foods. Handout provided Recommend if patient wants further weight management support would need referral to [facility].  Information provided to patient.  Contact information given    MONITORING, EVALUATION, GOAL: weight trends, intake   NEXT VISIT: no follow-up  Skylynn Burkley B. Zenia Resides, [place], [place] Registered Dietitian [phone] (mobile)

## 2021-01-04 ENCOUNTER — Encounter: Payer: Self-pay | Admitting: Physical Therapy

## 2021-01-18 ENCOUNTER — Encounter: Payer: Self-pay | Admitting: Physical Therapy

## 2021-01-31 ENCOUNTER — Ambulatory Visit: Payer: PRIVATE HEALTH INSURANCE | Attending: General Surgery

## 2021-01-31 ENCOUNTER — Other Ambulatory Visit: Payer: Self-pay

## 2021-01-31 DIAGNOSIS — Z483 Aftercare following surgery for neoplasm: Secondary | ICD-10-CM | POA: Insufficient documentation

## 2021-01-31 NOTE — Therapy (Signed)
[place], [place], [address] Phone: [phone]   Fax:  [phone]  Physical Therapy Treatment  Patient Details  Name: Tracy Guerra MRN: [id number] Date of Birth: [birth year] Referring Provider (PT): Donne Hazel   Encounter Date: 01/31/2021   PT End of Session - 01/31/21 1709    Visit Number 17   # unchanged due to screen only   Number of Visits 21    Date for PT Re-Evaluation 12/21/20    PT Start Time 6767    PT Stop Time 1659    PT Time Calculation (min) 10 min    Activity Tolerance Patient tolerated treatment well    Behavior During Therapy [facility] for tasks assessed/performed           Past Medical History:  Diagnosis Date  . Complication of anesthesia    slow to wake  . Hypothyroidism     Past Surgical History:  Procedure Laterality Date  . BREAST RECONSTRUCTION WITH PLACEMENT OF TISSUE EXPANDER AND ALLODERM Left 08/18/2020   Procedure: LEFT BREAST RECONSTRUCTION WITH PLACEMENT OF TISSUE EXPANDER AND ALLODERM;  Surgeon: Irene Limbo, MD;  Location: [place];  Service: Plastics;  Laterality: Left;  Marland Kitchen MASTOPEXY Right 11/05/2020   Procedure: RIGHT MASTOPEXY;  Surgeon: Irene Limbo, MD;  Location: [place];  Service: Plastics;  Laterality: Right;  . NASAL SEPTUM SURGERY    . NIPPLE SPARING MASTECTOMY WITH SENTINEL LYMPH NODE BIOPSY Left 08/18/2020   Procedure: LEFT NIPPLE SPARING MASTECTOMY WITH SENTINEL LYMPH NODE BIOPSY;  Surgeon: Rolm Bookbinder, MD;  Location: [place];  Service: General;  Laterality: Left;  RNFA, PEC BLOCK  . REMOVAL OF TISSUE EXPANDER AND PLACEMENT OF IMPLANT Left 11/05/2020   Procedure: REMOVAL OF LEFT TISSUE EXPANDER AND PLACEMENT OF IMPLANT;  Surgeon: Irene Limbo, MD;  Location: [place];  Service: Plastics;  Laterality: Left;  . vaginal polyp removed      There were no vitals filed  for this visit.   Subjective Assessment - 01/31/21 1658    Subjective Pt returns for her 3 month L-Dex screen.    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    Time 4    Period Weeks    Status Achieved      PT LONG TERM GOAL #2   Title Pt will demonstrate 165 degrees of left shoulder flexion to allow her to reach items overhead.    Baseline 165    Time 4  Period Weeks    Status Achieved      PT LONG TERM GOAL #3   Title Pt will demonstrate 165 degrees of left shoulder abduction to allow her to reach out to the side.    Baseline 175    Time 4    Period Weeks    Status Achieved      PT LONG TERM GOAL #4   Title Pt will report a 75% improvement in discomfort in left axilla caused by seroma to allow improved comfort.    Baseline 85% improvement    Time 4    Period Weeks    Status Achieved      PT LONG TERM GOAL #5   Title Pt will demonstrate decreased left breast swelling as visualized by decreased pore size to decrease risk of swelling.    Baseline pores are enlarged on left inferior breast; 10/13/20- pores are still slightly enlarged but improving - 12/7- not working on swelling due to recent reconstruction    Time 4    Period Weeks    Status Deferred      PT LONG TERM GOAL #6   Title Pt will report no pulling sensation throughout arm  during L shoulder abduction to allow improved comfort.    Time 4    Period Weeks    Status On-going      PT LONG TERM GOAL #7   Title Pt will report a 75% improvement in scar tissue to allow improved comfort.    Time 4    Period Weeks    Status On-going                 Plan - 01/31/21 1709    Clinical Impression Statement Pt returns for her 3 month L-Dex screen. Her change from baseline of 2.4 is WNLs so no further treatment is required at this time except to continue her every 3 month L-Dex screen which pt is agreeable to.    PT Next Visit Plan Cont every 3 month L-Dex screens until 2 years after SLNB.    Consulted and Agree with Plan of Care Patient           Patient will benefit from skilled therapeutic intervention in order to improve the following deficits and impairments:     Visit Diagnosis: Aftercare following surgery for neoplasm     Problem List Patient Active Problem List   Diagnosis Date Noted  . S/P mastectomy, left 08/18/2020  . Malignant neoplasm of upper-outer quadrant of left breast in female, estrogen receptor positive ([place]) 04/13/2020    Otelia Limes, PTA 01/31/2021, 5:16 PM  [place], [place], [address] Phone: [phone]   Fax:  [phone]  Name: Tracy Guerra MRN: [id number] Date of Birth: 11/16/1976

## 2021-02-09 ENCOUNTER — Ambulatory Visit: Payer: Self-pay | Admitting: Physical Therapy

## 2021-02-10 ENCOUNTER — Telehealth: Payer: Self-pay | Admitting: *Deleted

## 2021-02-10 NOTE — Telephone Encounter (Signed)
Received an email from patient wanting to r/s her appointment from 3/11 to 3/18. I have reschedule the patient per her request and responded to her email with appt time and date.

## 2021-02-25 ENCOUNTER — Ambulatory Visit: Payer: PRIVATE HEALTH INSURANCE | Admitting: Hematology and Oncology

## 2021-03-02 ENCOUNTER — Other Ambulatory Visit: Payer: Self-pay

## 2021-03-02 ENCOUNTER — Ambulatory Visit
Admission: RE | Admit: 2021-03-02 | Discharge: 2021-03-02 | Disposition: A | Discharge status: Home / Self Care | Payer: PRIVATE HEALTH INSURANCE | Source: Ambulatory Visit | Attending: Hematology and Oncology | Admitting: Hematology and Oncology

## 2021-03-02 ENCOUNTER — Ambulatory Visit: Payer: PRIVATE HEALTH INSURANCE | Attending: Hematology and Oncology | Admitting: Physical Therapy

## 2021-03-02 DIAGNOSIS — Z170 Estrogen receptor positive status [ER+]: Secondary | ICD-10-CM

## 2021-03-02 DIAGNOSIS — M6281 Muscle weakness (generalized): Secondary | ICD-10-CM | POA: Insufficient documentation

## 2021-03-02 DIAGNOSIS — C50412 Malignant neoplasm of upper-outer quadrant of left female breast: Secondary | ICD-10-CM

## 2021-03-02 DIAGNOSIS — Z483 Aftercare following surgery for neoplasm: Secondary | ICD-10-CM | POA: Diagnosis present

## 2021-03-02 IMAGING — MG MM DIGITAL DIAGNOSTIC UNILAT*R* W/ TOMO W/ CAD
4 series · 4 of 12 positions shown · non-contrast
Comparison: Previous exam(s).

CLINICAL DATA: 44-year-old female status post left mastectomy and
right breast lift. No current symptoms.

EXAM:
DIGITAL DIAGNOSTIC UNILATERAL RIGHT MAMMOGRAM WITH TOMOSYNTHESIS AND
CAD
TECHNIQUE: Right digital diagnostic mammography and breast tomosynthesis was
performed. The images were evaluated with computer-aided detection.

[R CC synth-2D]
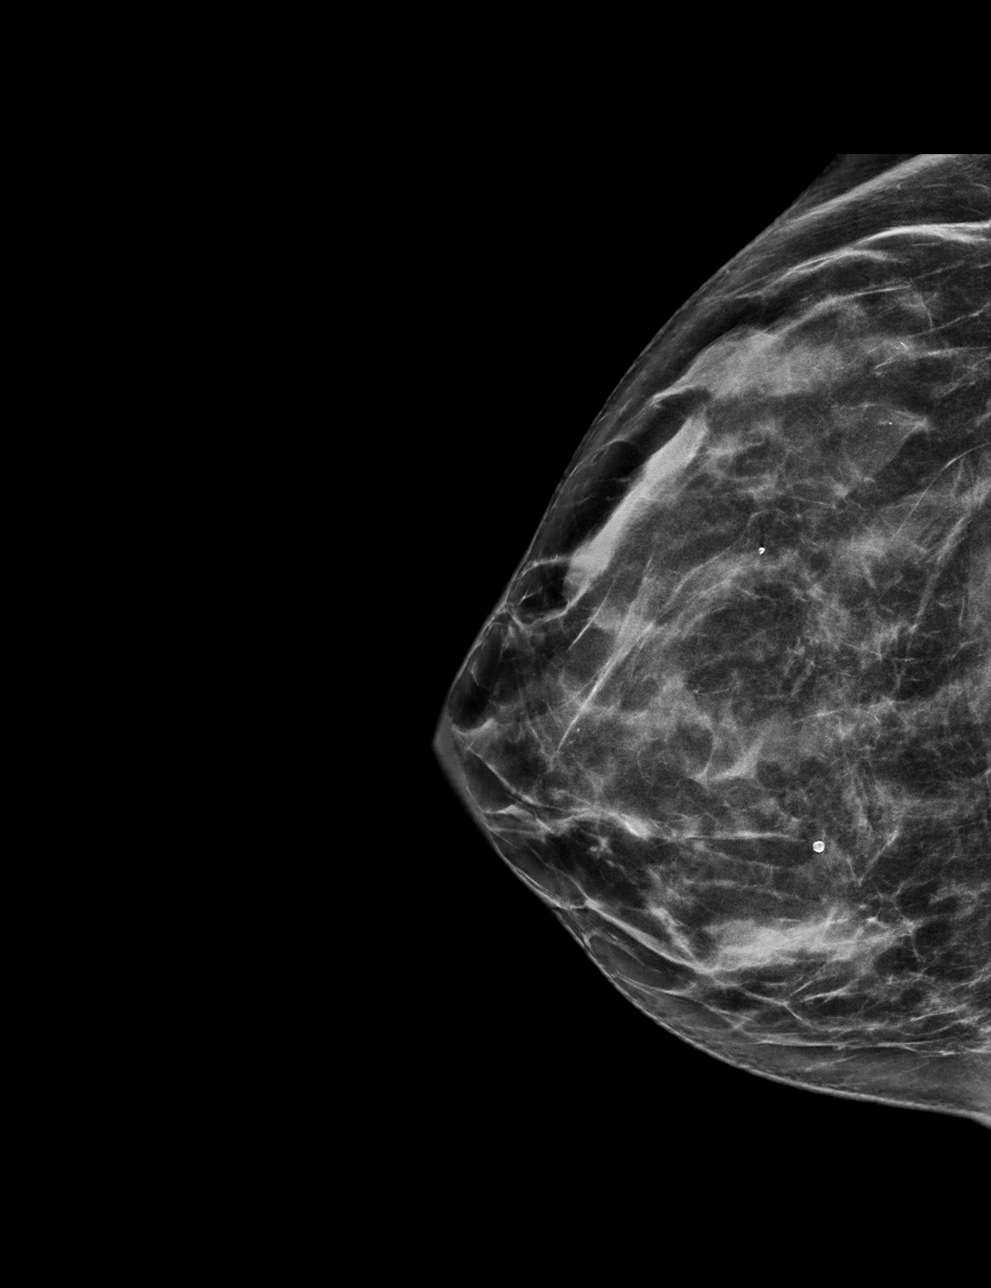

[R MLO synth-2D]
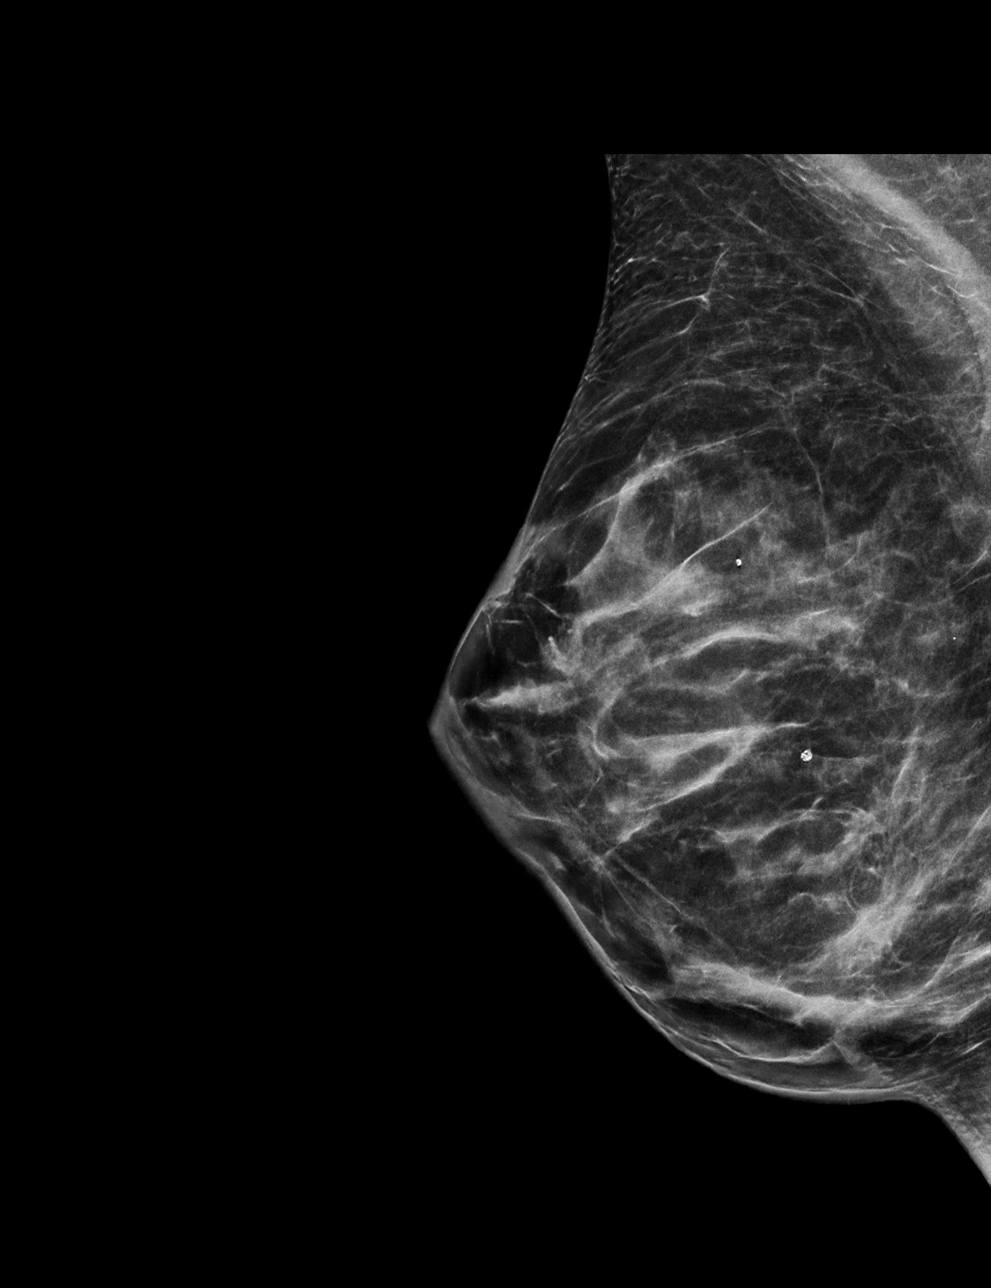

[R MLO tomo · tomo slice 28/55.0]
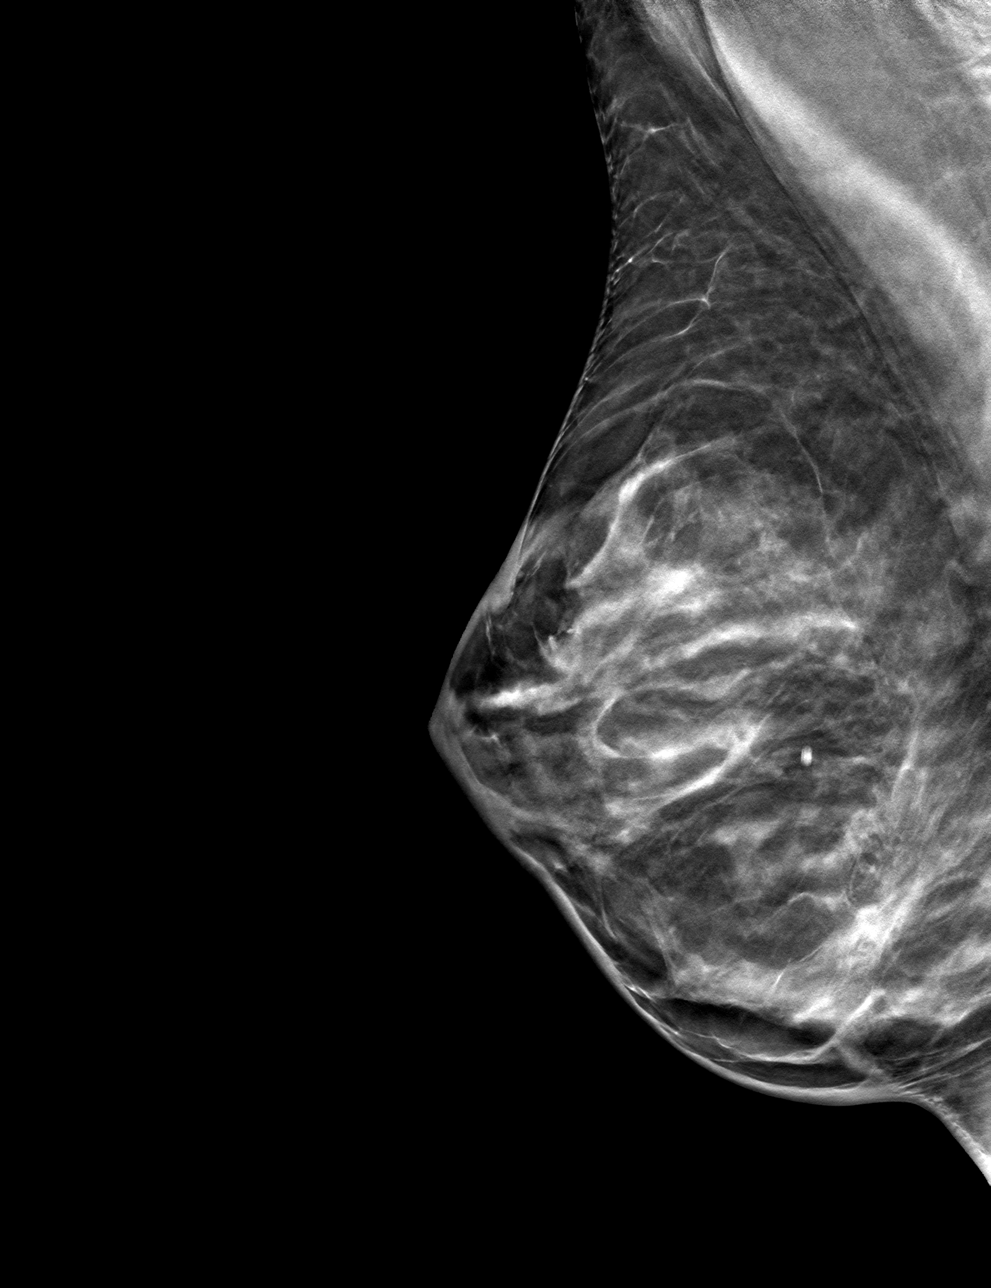

[R CC tomo · tomo slice 31/61.0]
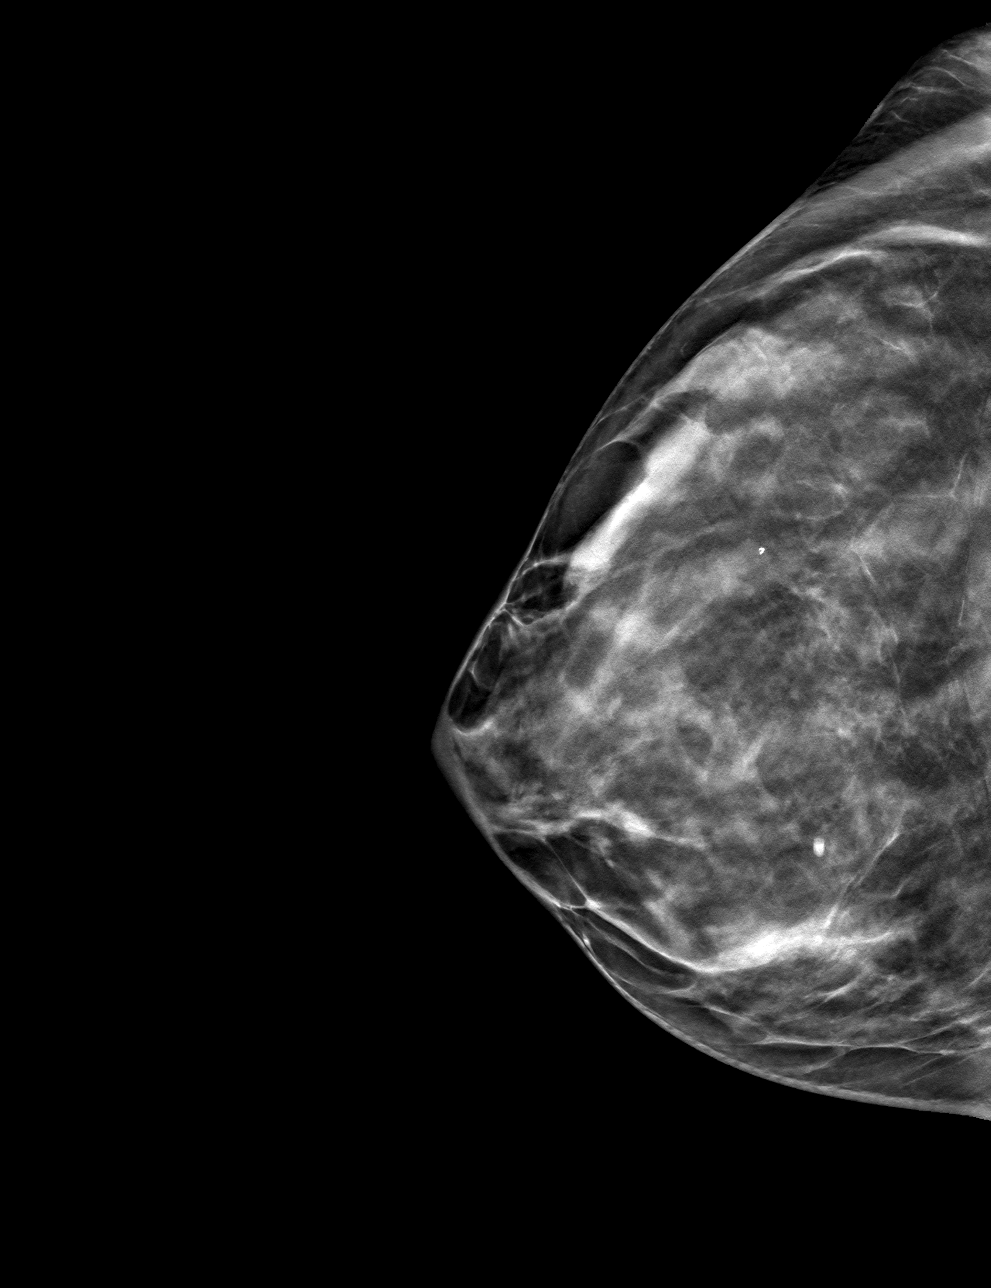

[4 of 12 positions shown; findings below may reference images not displayed]

ACR Breast Density Category c: The breast tissue is heterogeneously
dense, which may obscure small masses.
FINDINGS: Interval postsurgical changes noted in the right breast. Otherwise
no new or suspicious mammographic findings.
IMPRESSION: No mammographic evidence of malignancy on the right.

RECOMMENDATION:
Routine annual screening of the right breast in 1 year.

I have discussed the findings and recommendations with the patient.
If applicable, a reminder letter will be sent to the patient
regarding the next appointment.

BI-RADS CATEGORY  2: Benign.

## 2021-03-02 NOTE — Therapy (Signed)
[facility] [address], [place], [place], [address] Phone: [phone]   Fax:  [phone]  Physical Therapy Evaluation/Discharge  Patient Details  Name: Tracy Guerra MRN: [id number] Date of Birth: 01/16/1976 Referring Provider (PT): Dr. Nicholas Lose   Encounter Date: 03/02/2021   PT End of Session - 03/02/21 1431    Visit Number 1    Date for PT Re-Evaluation 04/27/21    Authorization Type Generic commercial    PT Start Time 1400    PT Stop Time 1445    PT Time Calculation (min) 45 min    Activity Tolerance Patient tolerated treatment well    Behavior During Therapy [facility] for tasks assessed/performed           Past Medical History:  Diagnosis Date  . Complication of anesthesia    slow to wake  . Hypothyroidism     Past Surgical History:  Procedure Laterality Date  . BREAST RECONSTRUCTION WITH PLACEMENT OF TISSUE EXPANDER AND ALLODERM Left 08/18/2020   Procedure: LEFT BREAST RECONSTRUCTION WITH PLACEMENT OF TISSUE EXPANDER AND ALLODERM;  Surgeon: Irene Limbo, MD;  Location: [place];  Service: Plastics;  Laterality: Left;  Marland Kitchen MASTOPEXY Right 11/05/2020   Procedure: RIGHT MASTOPEXY;  Surgeon: Irene Limbo, MD;  Location: [place];  Service: Plastics;  Laterality: Right;  . NASAL SEPTUM SURGERY    . NIPPLE SPARING MASTECTOMY WITH SENTINEL LYMPH NODE BIOPSY Left 08/18/2020   Procedure: LEFT NIPPLE SPARING MASTECTOMY WITH SENTINEL LYMPH NODE BIOPSY;  Surgeon: Rolm Bookbinder, MD;  Location: [place];  Service: General;  Laterality: Left;  RNFA, PEC BLOCK  . REMOVAL OF TISSUE EXPANDER AND PLACEMENT OF IMPLANT Left 11/05/2020   Procedure: REMOVAL OF LEFT TISSUE EXPANDER AND PLACEMENT OF IMPLANT;  Surgeon: Irene Limbo, MD;  Location: [place];  Service: Plastics;  Laterality: Left;  . vaginal polyp removed      There were no  vitals filed for this visit.    Subjective Assessment - 03/02/21 1413    Subjective I will see Dr. Lindi Adie this week. Stopped Tamoxifen 2 months ago. Stopped due to extreme vaginal dryness. Still has vaginal dryness and stay away from intercourse. Outer wall of the vaginal feels very fragile. She sees Dr. Helane Rima. The tissue feels like it has less elasticity.    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 Nicholas Lose    Onset Date/Surgical Date 08/18/20    Prior Therapy none      Precautions   Precautions Other (comment)    Precaution Comments breast cancer estrogen positive      Restrictions   Weight Bearing Restrictions No      Balance Screen   Has the patient fallen in the past 6 months No    Has the patient had a decrease in activity level because of a fear of falling?  No    Is the patient reluctant to leave their home because of a fear of falling?  No      Home Ecologist residence  Living Arrangements Spouse/significant other;Children    Available Help at Discharge Family    Type of [place]      Prior Function   Level of [place] time employment    Vocation Requirements pediatric dentist    Leisure pt walks for exercise and does yoga      Cognition   Overall Cognitive Status Within Functional Limits for tasks assessed                      Objective measurements completed on examination: See above findings.     Pelvic Floor Special Questions - 03/02/21 0001    Prior Pregnancies Yes    Number of Vaginal Deliveries 1    Episiotomy Performed Yes   level 2    Currently Sexually Active Yes    Is this Painful Yes    Urinary Leakage No    Urinary urgency No    Fecal incontinence No    Skin Integrity Intact;Other   dryness along the labia minora   Pelvic Floor Internal Exam Patient confirms identification and appproves PT to assess pelvic floor and treatment    Exam Type Vaginal    Palpation when she has the lower abdominals engage her contraction becomes stronger. Patient placed a glove on her hand and was able to feel the pelvic floor contraction    Strength weak squeeze, no lift   circular with no lift           [facility] Adult PT Treatment/Exercise - 03/02/21 0001      Self-Care   Self-Care Other Self-Care Comments    Other Self-Care Comments  educated patient on different lubricants and mositurizers for the vaginal area and vaginal health                  PT Education - 03/02/21 1624    Education Details educated patient on vaginal moisturizers and lubricants; gave patient several samples    Person(s) Educated Patient    Methods Explanation;Handout    Comprehension Verbalized understanding               PT Long Term Goals - 03/02/21 1707      PT LONG TERM GOAL #8   Title Patient was educated on vaginal moisturizers and lubricants to reduce her vaginal dryness    Time 1    Period Days    Status Achieved    Target Date 03/02/21                  Plan - 03/02/21 1625    Clinical Impression Statement Patient is s/p malignant neoplasm of upper-outer quadrant of left breast in female,estrogen positive. She had left masectomy surgery on 08/18/2020. Patient was placed on Tamoxifen but caused increased in vaginal dryness internally and externally all day making it difficult to function. Patient reports she is now off the Tamoxifen and the dryness is better but still causes issues with penile penetration vaginally. Pelvic floor strength is 2/5 and she was able to feel the contraction herself. When she contracts the lower  abdominals the pelvic floor contraction is increased. Patient was educated today on vaginal moisturizers and lubricants. Patient wants today to be a one time session and correspond through email if she has any further questions.    Personal Factors and Comorbidities Comorbidity 1;Sex    Comorbidities left breast cancer estrogen positive    Examination-Participation Restrictions Interpersonal Relationship    Stability/Clinical Decision  Making Stable/Uncomplicated    Clinical Decision Making Low    PT Frequency One time visit    PT Treatment/Interventions Therapeutic exercise;Patient/family education    PT Next Visit Plan Discharge to with knowledge of vaginal moisturizers and lubricants to reduce her vaginal dryness    Consulted and Agree with Plan of Care Patient           Patient will benefit from skilled therapeutic intervention in order to improve the following deficits and impairments:  Decreased strength,Decreased activity tolerance,Decreased coordination  Visit Diagnosis: Muscle weakness (generalized) - Plan: PT plan of care cert/re-cert  Aftercare following surgery for neoplasm - Plan: PT plan of care cert/re-cert     Problem List Patient Active Problem List   Diagnosis Date Noted  . S/P mastectomy, left 08/18/2020  . Malignant neoplasm of upper-outer quadrant of left breast in female, estrogen receptor positive ([place]) 04/13/2020    Earlie Counts, PT 03/02/21 5:11 PM   [facility] [address], [place], [place], [address] Phone: [phone]   Fax:  [phone]  Name: Janila Arrazola MRN: [id number] Date of Birth: [birth year] PHYSICAL THERAPY DISCHARGE SUMMARY  Visits from Start of Care: 1  Current functional level related to goals / functional outcomes: See above.    Remaining deficits: See above.    Education / Equipment: HEP Plan: Patient agrees to discharge.  Patient goals were met. Patient  is being discharged due to meeting the stated rehab goals. Thank you for the referral. Earlie Counts, PT 03/02/21 5:11 PM   ?????

## 2021-03-02 NOTE — Patient Instructions (Signed)
Moisturizers . They are used in the vagina to hydrate the mucous membrane that make up the vaginal canal. . Designed to keep a more normal acid balance (ph) . Once placed in the vagina, it will last between two to three days.  . Use 2-3 times per week at bedtime  . Ingredients to avoid is glycerin and fragrance, can increase chance of infection . Should not be used just before sex due to causing irritation . Most are gels administered either in a tampon-shaped applicator or as a vaginal suppository. They are non-hormonal.   Types of Moisturizers(internal use)  . Vitamin E vaginal suppositories- Whole foods, Amazon . Moist Again . Coconut oil- can break down condoms . Julva- (Do no use if on Tamoxifen) amazon . Yes moisturizer- amazon . NeuEve Silk , NeuEve Silver for menopausal or over 65 (if have severe vaginal atrophy or cancer treatments use NeuEve Silk for  1 month than move to The Pepsi)- [employer], MapleFlower.dk . Olive and Bee intimate cream- www.oliveandbee.com.au . Mae vaginal Kendall . Aloe .    Creams to use externally on the Vulva area  Albertson's (good for for cancer patients that had radiation to the area)- [place] or [employer].FlyingBasics.com.br  V-magic cream - amazon  Julva-amazon  Vital "V Wild Yam salve ( help moisturize and help with thinning vulvar area, does have [place] by Irwin Brakeman labial moisturizer ([place],   Coconut or olive oil  aloe   Things to avoid in the vaginal area . Do not use things to irritate the vulvar area . No lotions just specialized creams for the vulva area- Neogyn, V-magic, No soaps; can use Aveeno or Calendula cleanser if needed. Must be gentle . No deodorants . No douches . Good to sleep without underwear to let the vaginal area to air out . No scrubbing: spread the lips to let warm water rinse over labias and pat  dry  Lubrication . Used for intercourse to reduce friction . Avoid ones that have glycerin, warming gels, tingling gels, icing or cooling gel, scented . Avoid parabens due to a preservative similar to female sex hormone . May need to be reapplied once or several times during sexual activity . Can be applied to both partners genitals prior to vaginal penetration to minimize friction or irritation . Prevent irritation and mucosal tears that cause post coital pain and increased the risk of vaginal and urinary tract infections . Oil-based lubricants cannot be used with condoms due to breaking them down.  Least likely to irritate vaginal tissue.  . Plant based-lubes are safe . Silicone-based lubrication are thicker and last long and used for post-menopausal women  Vaginal Lubricators Here is a list of some suggested lubricators you can use for intercourse. Use the most hypoallergenic product.  You can place on you or your partner.   Slippery Stuff ( water based)  Sylk or Sliquid Natural H2O ( good  if frequent UTI's)- walmart, amazon  Sliquid organics silk-(aloe and silicone based )  Bank [employer] [place] [employer] (www.blossom-organics.com)- (aloe based )  Coconut oil, olive oil -not good with condoms   PJur Woman Nude- (water based) amazon  Uberlube- ( silicon) [place] has an organic one  Yes lubricant- (water based and has plant oil based similar to silicone) [employer] Platinum-Silicone, Target, Walgreens  Olive and Bee intimate cream-  www.oliveandbee.com.au  Pink - [employer]  Erosense Sync- walmart, [employer] to avoid in lubricants are glycerin, warming gels, tingling gels, icing or cooling  gels, and scented gels.  Also avoid Vaseline. KY jelly, Replens, and Astroglide kills good bacteria(lactobacilli)  Things to avoid in the vaginal area . Do not use things to irritate the vulvar area . No lotions- see below . No soaps; can use Aveeno or Calendula  cleanser if needed. Must be gentle . No deodorants . No douches . Good to sleep without underwear to let the vaginal area to air out . No scrubbing: spread the lips to let warm water rinse over labias and pat dry  Creams that can be used on the Vulva Area  V Bank [employer] [place] [employer], [place]  MoonMaid Botanical Pro-Meno Wild Yam Cream  Coconut oil, olive oil  Cleo by [employer] labial moisturizer -[place],   [place] Releveum ( lidocaine) or [facility] [address], [place], [place] [address] Phone # [phone] Fax [phone] [email or web address]

## 2021-03-03 NOTE — Progress Notes (Signed)
Patient Care Team: Reynold Bowen, MD as PCP - General (Endocrinology) Mauro Kaufmann, RN as Oncology Nurse Navigator Rockwell Germany, RN as Oncology Nurse Navigator  DIAGNOSIS:    ICD-10-CM   1. Malignant neoplasm of upper-outer quadrant of left breast in female, estrogen receptor positive ([place])  C50.412 MM DIAG BREAST TOMO UNI RIGHT   Z17.0     SUMMARY OF ONCOLOGIC HISTORY: Oncology History  Malignant neoplasm of upper-outer quadrant of left breast in female, estrogen receptor positive ([place])  03/02/2020 Initial Diagnosis   Screening mammogram revealed left breast calcifications 5.8 cm overall.  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    03/28/2020 Breast MRI   two sites of biopsy proven disease in the upper outer left breast with subtle non-mass enhancement extending between them suspicious for invasive disease.  Additional 1 cm nodule between the 2 biopsy sites.  Total span 10 cm   05/19/2020 Initial Biopsy   Left breast biopsy UOQ: Grade 3 IDC with DCIS, LOQ: DCIS with calcifications and necrosis, invasive cancer ER 95%, PR 80%, Ki-67 30%, HER-2 negative (1+)   05/24/2020 Cancer Staging   Staging form: Breast, AJCC 8th Edition - Clinical: Stage IA (cT1b, cN0, cM0, G3, ER+, PR+, HER2-) - Signed by Nicholas Lose, MD on 05/24/2020   08/18/2020 Surgery   Left mastectomy with recontruction [name]): 1 cm IDC, grade 3, with high grade DCIS, clear margins, 3 left axillary lymph nodes negative for carcinoma.     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 with above-mentioned history of left breast DCIS currently on neoadjuvant antiestrogen therapy, left mastectomy with reconstruction, and is currently on antiestrogen therapy with tamoxifen. Mammogram on 03/02/21 showed no evidence of malignancy bilaterally. She presents to the clinic todayfor follow-up.  She could not tolerate tamoxifen  even taking 10 mg every other day and she finally discontinued it.  She had multiple severe symptoms of hot flashes, mood swings irritability etc.  She does not want to take it any further.  ALLERGIES:  is allergic to avelox [moxifloxacin hcl in nacl].  MEDICATIONS:  Current Outpatient Medications  Medication Sig Dispense Refill  . acetaminophen (TYLENOL) 500 MG tablet Take by mouth.    . Cholecalciferol (VITAMIN D) 2000 UNITS CAPS Take 1 capsule by mouth daily.    . fluorouracil (EFUDEX) 5 % cream Apply topically 2 (two) times daily. 40 g 2  . fluticasone (FLONASE) 50 MCG/ACT nasal spray Place into the nose.    . levothyroxine (SYNTHROID) 25 MCG tablet Take 1-2 tablets (25-50 mcg total) by mouth daily. Take 25 mcg 2 times a week and 50 mcg twice a week    . methocarbamol (ROBAXIN) 500 MG tablet Take by mouth. (Patient not taking: Reported on 03/02/2021)    . naproxen sodium (ALEVE) 220 MG tablet Take by mouth. (Patient not taking: Reported on 03/02/2021)    . nystatin-triamcinolone (MYCOLOG II) cream APPLY TO THE AFFECTED AREA TWICE DAILY AS NEEDED FOR MORNING AND EVENING (Patient not taking: Reported on 03/02/2021)    . Omega-3 Fatty Acids (FISH OIL PO) Take by mouth.    . Selenium 100 MCG CAPS Take 1 capsule (100 mcg total) by mouth daily.  0  . sulfamethoxazole-trimethoprim (BACTRIM DS) 800-160 MG tablet Take 1 tablet by mouth 2 (two) times daily. (Patient not taking: Reported on 03/02/2021)    . traMADol (ULTRAM) 50 MG tablet Take  by mouth. (Patient not taking: Reported on 03/02/2021)     No current facility-administered medications for this visit.    PHYSICAL EXAMINATION: ECOG PERFORMANCE STATUS: 1 - Symptomatic but completely ambulatory  Vitals:   03/04/21 1121  BP: 112/65  Pulse: 85  Resp: 16  Temp: (!) 97.5 F (36.4 C)  SpO2: 100%   Filed Weights   03/04/21 1121  Weight: 141 lb 6.4 oz (64.1 kg)       LABORATORY DATA:  I have reviewed the data as listed No flowsheet  data found.  Lab Results  Component Value Date   WBC 6.4 09/07/2018   HGB 12.7 09/07/2018   HCT 38.2 09/07/2018   MCV 97.0 09/07/2018   PLT 245 09/07/2018   NEUTROABS 3.6 09/07/2018    ASSESSMENT & PLAN:  Malignant neoplasm of upper-outer quadrant of left breast in female, estrogen receptor positive ([place]) 08/18/2020:Left mastectomy with recontruction [name]): 1 cm IDC, grade 3, with high grade DCIS, clear margins, 3 left axillary lymph nodes negative for carcinoma.  ER 95%, PR 80%, HER-2 negative, Ki-67 30% Oncotype DX recurrence score: 22: Distant recurrence at 9 years: 8% (after discussing risks and benefits, patient decided not to receive chemotherapy)  Treatment plan: Tamoxifen 10 mg daily x10 years started 08/26/2020 Tamoxifen toxicities: 1.  Profound mood swings and irritability 2. hot flashes 3.  Vaginal dryness  After lengthy discussion about risks and benefits of antiestrogen therapy, she decided that she does not want to take it any further.  She has changed her lifestyle and plans to exercise regularly, take supplements that can help reduce breast cancer risk by turmeric and vitamin D and eat nonprocessed healthy food including less red meat and less alcohol.  Breast cancer surveillance: Mammogram right breast: Benign breast density category C Our plan is to get a breast MRI in August 2023.  We will do MRIs every 2 to 3 years.  Return to clinic in 1 year for follow-up     Orders Placed This Encounter  Procedures  . MM DIAG BREAST TOMO UNI RIGHT    Standing Status:   Future    Standing Expiration Date:   03/04/2022    Order Specific Question:   Reason for Exam (SYMPTOM  OR DIAGNOSIS REQUIRED)    Answer:   Annual mammogram    Order Specific Question:   Is the patient pregnant?    Answer:   No    Order Specific Question:   Preferred imaging location?    Answer:   [facility]    Order Specific Question:   Release to patient    Answer:   Immediate    The patient has a good understanding of the overall plan. she agrees with it. she will call with any problems that may develop before the next visit here.  Total time spent: 30 mins including face to face time and time spent for planning, charting and coordination of care  Rulon Eisenmenger, MD, MPH 03/04/2021  I, Molly Dorshimer, am acting as scribe for Dr. Nicholas Lose.  I have reviewed the above documentation for accuracy and completeness, and I agree with the above.

## 2021-03-04 ENCOUNTER — Other Ambulatory Visit: Payer: Self-pay

## 2021-03-04 ENCOUNTER — Inpatient Hospital Stay: Payer: PRIVATE HEALTH INSURANCE | Attending: Hematology and Oncology | Admitting: Hematology and Oncology

## 2021-03-04 DIAGNOSIS — C50412 Malignant neoplasm of upper-outer quadrant of left female breast: Secondary | ICD-10-CM | POA: Insufficient documentation

## 2021-03-04 DIAGNOSIS — Z170 Estrogen receptor positive status [ER+]: Secondary | ICD-10-CM | POA: Diagnosis not present

## 2021-03-04 DIAGNOSIS — Z79810 Long term (current) use of selective estrogen receptor modulators (SERMs): Secondary | ICD-10-CM | POA: Insufficient documentation

## 2021-03-04 DIAGNOSIS — Z9012 Acquired absence of left breast and nipple: Secondary | ICD-10-CM | POA: Insufficient documentation

## 2021-03-04 NOTE — Assessment & Plan Note (Signed)
08/18/2020:Left mastectomy with recontruction [facility] [name]): 1 cm IDC, grade 3, with high grade DCIS, clear margins, 3 left axillary lymph nodes negative for carcinoma.  ER 95%, PR 80%, HER-2 negative, Ki-67 30% Oncotype DX recurrence score: 22: Distant recurrence at 9 years: 8% (after discussing risks and benefits, patient decided not to receive chemotherapy)  Treatment plan: Tamoxifen 10 mg daily x10 years started 08/26/2020 Tamoxifen toxicities:  Breast cancer surveillance: Mammogram right breast: Benign breast density category C  Return to clinic in 1 year for follow-up

## 2021-05-09 ENCOUNTER — Ambulatory Visit: Payer: PRIVATE HEALTH INSURANCE | Attending: Hematology and Oncology

## 2021-05-09 ENCOUNTER — Other Ambulatory Visit: Payer: Self-pay

## 2021-05-09 DIAGNOSIS — Z483 Aftercare following surgery for neoplasm: Secondary | ICD-10-CM | POA: Insufficient documentation

## 2021-05-09 NOTE — Therapy (Signed)
[place], [place], [address] Phone: [phone]   Fax:  [phone]  Physical Therapy Treatment  Patient Details  Name: Tracy Guerra MRN: [id number] Date of Birth: 1976-11-08 Referring Provider (PT): Dr. Nicholas Lose   Encounter Date: 05/09/2021   PT End of Session - 05/09/21 1651    Visit Number 1   # unchanged due to screen only   Number of Visits 21    PT Start Time 1649    PT Stop Time 1658    PT Time Calculation (min) 9 min    Activity Tolerance Patient tolerated treatment well    Behavior During Therapy [facility] for tasks assessed/performed           Past Medical History:  Diagnosis Date  . Complication of anesthesia    slow to wake  . Hypothyroidism     Past Surgical History:  Procedure Laterality Date  . BREAST RECONSTRUCTION WITH PLACEMENT OF TISSUE EXPANDER AND ALLODERM Left 08/18/2020   Procedure: LEFT BREAST RECONSTRUCTION WITH PLACEMENT OF TISSUE EXPANDER AND ALLODERM;  Surgeon: Irene Limbo, MD;  Location: [place];  Service: Plastics;  Laterality: Left;  Marland Kitchen MASTOPEXY Right 11/05/2020   Procedure: RIGHT MASTOPEXY;  Surgeon: Irene Limbo, MD;  Location: [place];  Service: Plastics;  Laterality: Right;  . NASAL SEPTUM SURGERY    . NIPPLE SPARING MASTECTOMY WITH SENTINEL LYMPH NODE BIOPSY Left 08/18/2020   Procedure: LEFT NIPPLE SPARING MASTECTOMY WITH SENTINEL LYMPH NODE BIOPSY;  Surgeon: Rolm Bookbinder, MD;  Location: [place];  Service: General;  Laterality: Left;  RNFA, PEC BLOCK  . REMOVAL OF TISSUE EXPANDER AND PLACEMENT OF IMPLANT Left 11/05/2020   Procedure: REMOVAL OF LEFT TISSUE EXPANDER AND PLACEMENT OF IMPLANT;  Surgeon: Irene Limbo, MD;  Location: [place];  Service: Plastics;  Laterality: Left;  . vaginal polyp removed      There were no vitals filed for this visit.   Subjective  Assessment - 05/09/21 1649    Subjective Pt returns for her 3 month L-Dex screen.    Pertinent History L breast cancer, DCIS, 08/17/20- will undergo L mastectomy with SLNB on 08/18/20, then Rt breast lift and implant exchange 11/05/20                  L-DEX FLOWSHEETS - 05/09/21 1600      L-DEX LYMPHEDEMA SCREENING   Measurement Type --    L-DEX MEASUREMENT EXTREMITY Upper Extremity    POSITION  Standing    DOMINANT SIDE Right    At Risk Side Left    BASELINE SCORE (UNILATERAL) -0.9    L-DEX SCORE (UNILATERAL) -0.5    VALUE CHANGE (UNILAT) 0.4                                  PT Long Term Goals - 03/02/21 1707      PT LONG TERM GOAL #8   Title Patient was educated on vaginal moisturizers and lubricants to reduce her vaginal dryness    Time 1    Period Days    Status Achieved    Target Date 03/02/21                 Plan - 05/09/21 1659    Clinical Impression Statement Pt returns for her 3 month L-Dex screen. Her chnage from baseline of 0.4 is WNLs  so no further treatment is required at this time except to cont every 3 month L-Dex screens which pt is agreeable to.    PT Next Visit Plan Pt to cont every 3 month L-Dex screens for up to 2 years from her SLNB.    Consulted and Agree with Plan of Care Patient           Patient will benefit from skilled therapeutic intervention in order to improve the following deficits and impairments:     Visit Diagnosis: Aftercare following surgery for neoplasm     Problem List Patient Active Problem List   Diagnosis Date Noted  . S/P mastectomy, left 08/18/2020  . Malignant neoplasm of upper-outer quadrant of left breast in female, estrogen receptor positive ([place]) 04/13/2020    Otelia Limes, PTA 05/09/2021, 5:01 PM  [place], [place], [address] Phone: [phone]   Fax:  [phone]  Name: Malynn Lucy MRN: [id number] Date of Birth: [birth year]

## 2021-08-08 ENCOUNTER — Ambulatory Visit: Payer: PRIVATE HEALTH INSURANCE

## 2021-08-09 ENCOUNTER — Other Ambulatory Visit: Payer: Self-pay | Admitting: Hematology and Oncology

## 2021-08-09 DIAGNOSIS — C50412 Malignant neoplasm of upper-outer quadrant of left female breast: Secondary | ICD-10-CM

## 2022-01-26 ENCOUNTER — Telehealth: Payer: Self-pay | Admitting: *Deleted

## 2022-01-26 NOTE — Telephone Encounter (Signed)
Received an email that patient would like to change her appt from 3/17 to 3/28.  Appt r/s to 3/28 at 1115. Patient aware.

## 2022-01-31 ENCOUNTER — Other Ambulatory Visit: Payer: Self-pay | Admitting: Hematology and Oncology

## 2022-01-31 DIAGNOSIS — Z1231 Encounter for screening mammogram for malignant neoplasm of breast: Secondary | ICD-10-CM

## 2022-03-03 ENCOUNTER — Ambulatory Visit: Payer: PRIVATE HEALTH INSURANCE | Admitting: Hematology and Oncology

## 2022-03-07 ENCOUNTER — Ambulatory Visit
Admission: RE | Admit: 2022-03-07 | Discharge: 2022-03-07 | Disposition: A | Discharge status: Home / Self Care | Payer: PRIVATE HEALTH INSURANCE | Source: Ambulatory Visit | Attending: Hematology and Oncology | Admitting: Hematology and Oncology

## 2022-03-07 DIAGNOSIS — Z1231 Encounter for screening mammogram for malignant neoplasm of breast: Secondary | ICD-10-CM

## 2022-03-07 IMAGING — MG MM DIGITAL SCREENING UNILAT*R* W/ TOMO W/ CAD
4 series · 4 of 12 positions shown · non-contrast
Comparison: Previous exam(s).

CLINICAL DATA: Screening.

EXAM:
DIGITAL SCREENING UNILATERAL RIGHT MAMMOGRAM WITH CAD AND
TOMOSYNTHESIS
TECHNIQUE: Right screening digital craniocaudal and mediolateral oblique
mammograms were obtained. Right screening digital breast
tomosynthesis was performed. The images were evaluated with
computer-aided detection.

[R MLO synth-2D]
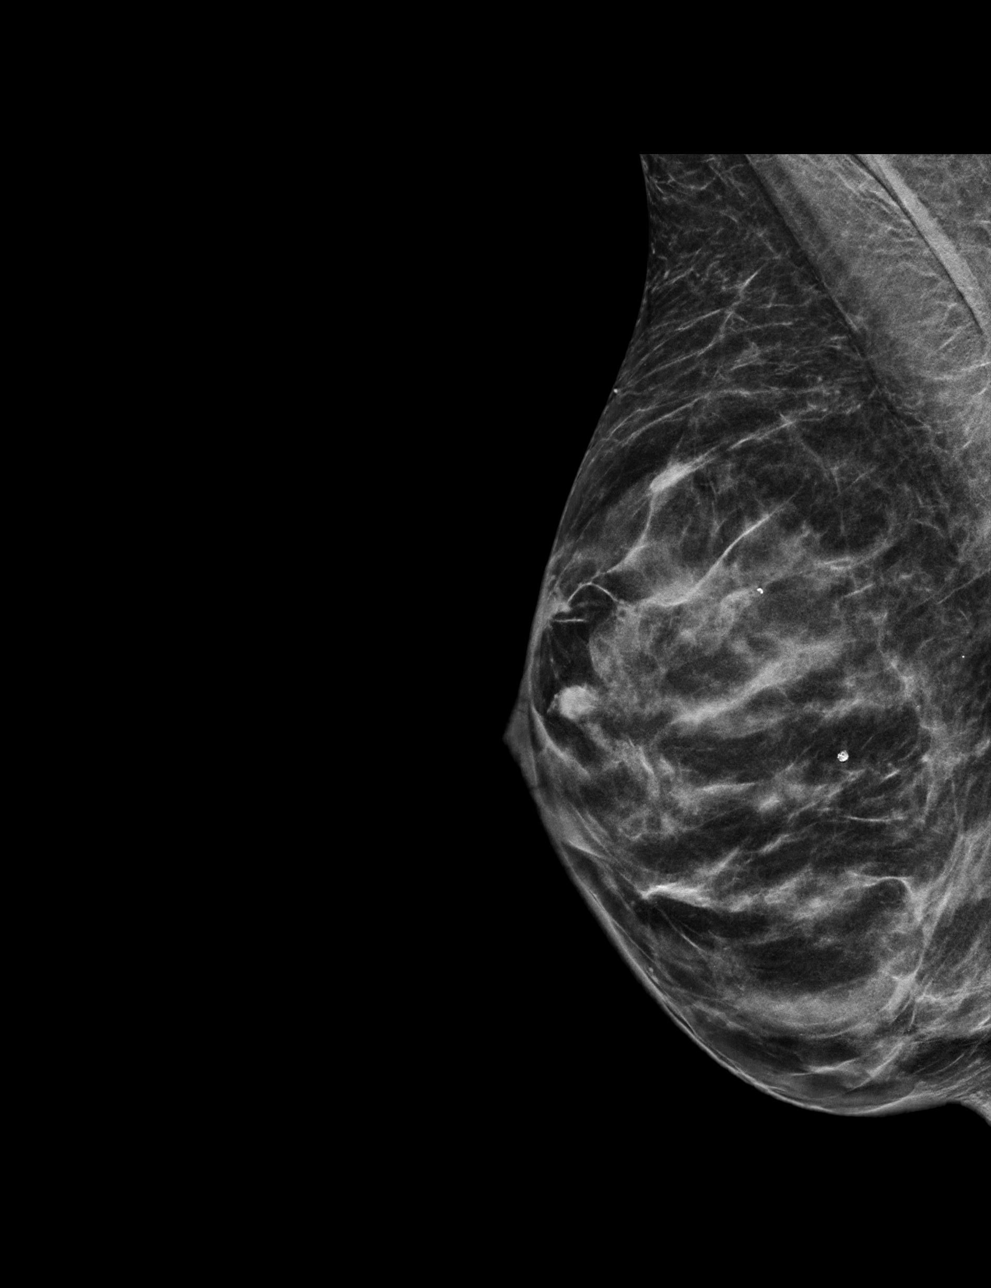

[R CC synth-2D]
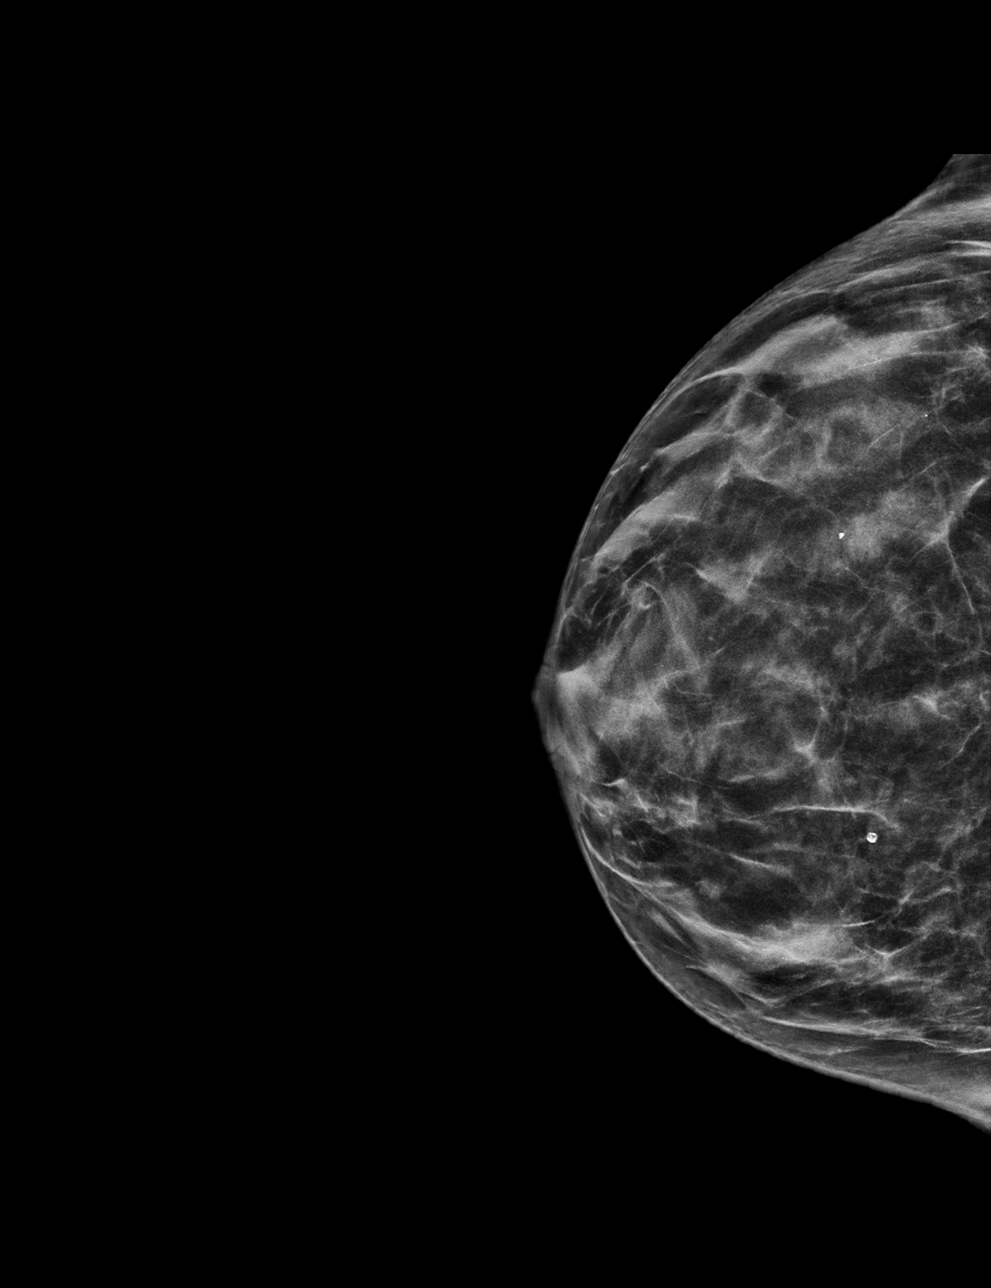

[R MLO tomo · tomo slice 27/52.0]
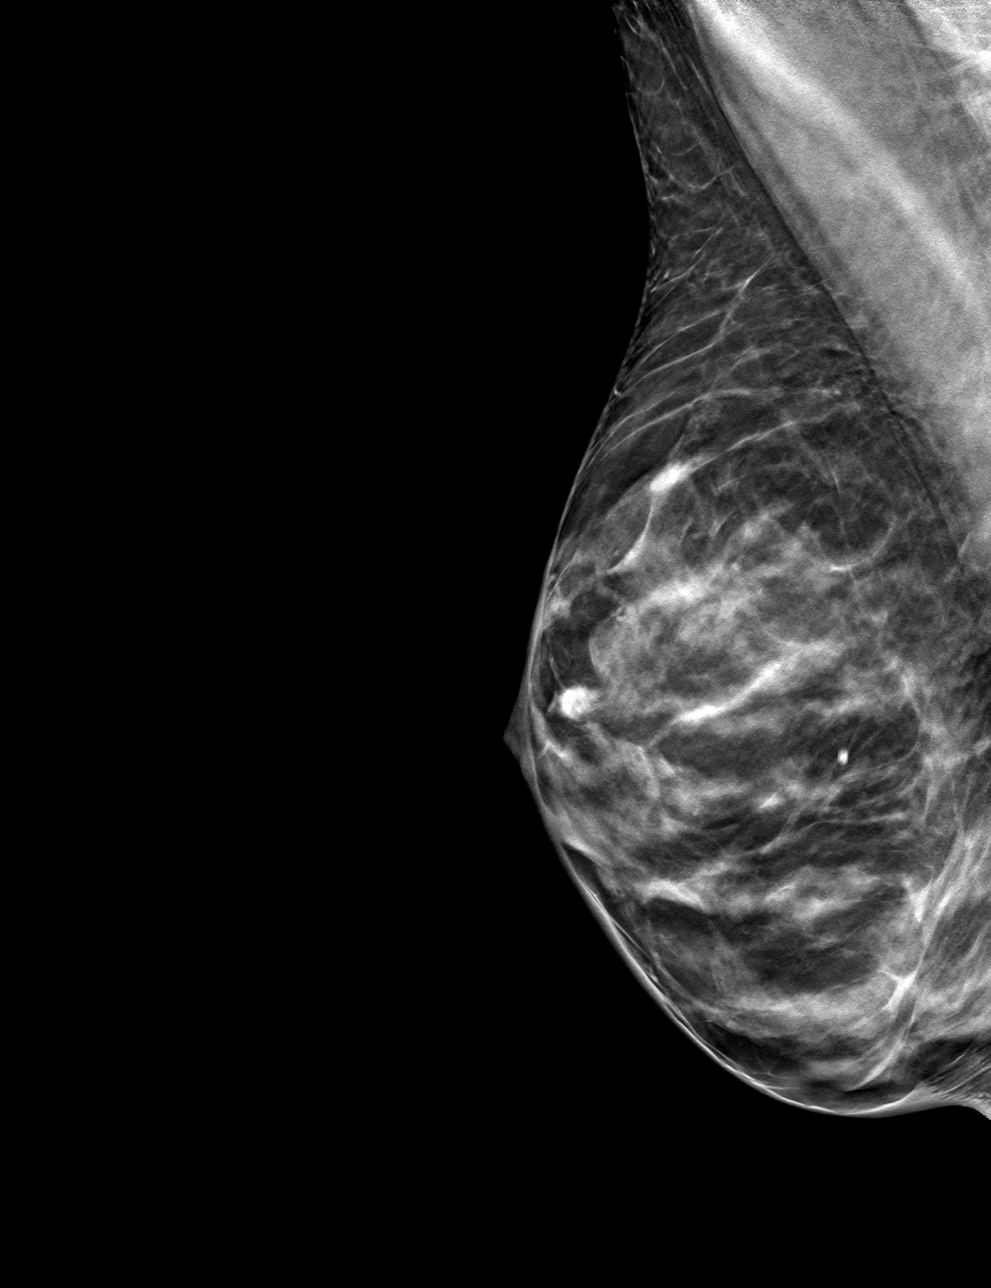

[R CC tomo · tomo slice 29/56.0]
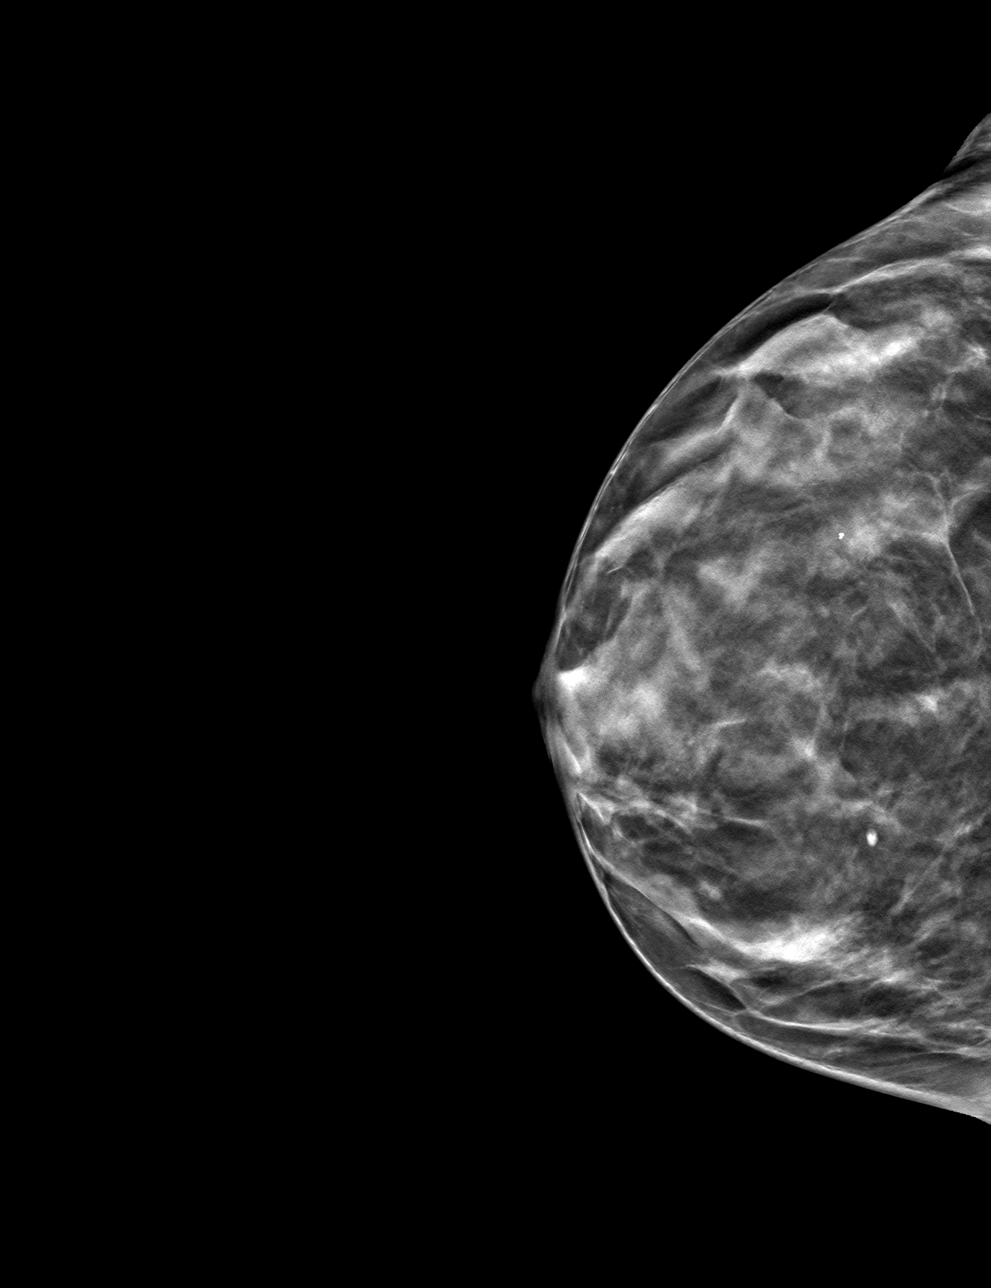

[4 of 12 positions shown; findings below may reference images not displayed]

ACR Breast Density Category c: The breast tissue is heterogeneously
dense, which may obscure small masses.
FINDINGS: The patient has had a left mastectomy. There are no findings
suspicious for malignancy.
IMPRESSION: No mammographic evidence of malignancy. A result letter of this
screening mammogram will be mailed directly to the patient.

RECOMMENDATION:
Screening mammogram in one year.  (Code:[DK])

BI-RADS CATEGORY  1: Negative.

## 2022-03-13 NOTE — Progress Notes (Signed)
? ?Patient Care Team: ?Reynold Bowen, MD as PCP - General (Endocrinology) ?Mauro Kaufmann, RN as Oncology Nurse Navigator ?Rockwell Germany, RN as Oncology Nurse Navigator ? ?DIAGNOSIS:  ?Encounter Diagnosis  ?Name Primary?  ? Malignant neoplasm of upper-outer quadrant of left breast in female, estrogen receptor positive ([place])   ? ? ?SUMMARY OF ONCOLOGIC HISTORY: ?Oncology History  ?Malignant neoplasm of upper-outer quadrant of left breast in female, estrogen receptor positive ([place])  ?03/02/2020 Initial Diagnosis  ? Screening mammogram revealed left breast calcifications 5.8 cm overall.  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  ?  ?03/28/2020 Breast MRI  ? two sites of biopsy proven disease in the upper outer left breast with subtle non-mass enhancement extending between them suspicious for invasive disease.  Additional 1 cm nodule between the 2 biopsy sites.  Total span 10 cm ?  ?05/19/2020 Initial Biopsy  ? Left breast biopsy UOQ: Grade 3 IDC with DCIS, LOQ: DCIS with calcifications and necrosis, invasive cancer ER 95%, PR 80%, Ki-67 30%, HER-2 negative (1+) ?  ?05/24/2020 Cancer Staging  ? Staging form: Breast, AJCC 8th Edition ?- Clinical: Stage IA (cT1b, cN0, cM0, G3, ER+, PR+, HER2-) - Signed by Nicholas Lose, MD on 05/24/2020 ? ?  ?08/18/2020 Surgery  ? Left mastectomy with recontruction [name]): 1 cm IDC, grade 3, with high grade DCIS, clear margins, 3 left axillary lymph nodes negative for carcinoma. ?  ? ? ?CHIEF COMPLIANT: Follow-up of left breast DCIS  ? ?INTERVAL HISTORY: Tracy Guerra is a 46 y.o. with above-mentioned history of left breast DCIS , left mastectomy with reconstruction, and discontinue tamoxifen because of intolerance. Mammogram on 03/02/21 showed no evidence of malignancy bilaterally. She presents to the clinic today for follow-up. She states that she feels better after coming off the tamoxifen. State she feels excellent from changing her diet  up. ? ? ?ALLERGIES:  is allergic to avelox [moxifloxacin hcl in nacl]. ? ?MEDICATIONS:  ?Current Outpatient Medications  ?Medication Sig Dispense Refill  ? acetaminophen (TYLENOL) 500 MG tablet Take by mouth.    ? Cholecalciferol (VITAMIN D) 2000 UNITS CAPS Take 1 capsule by mouth daily.    ? fluorouracil (EFUDEX) 5 % cream Apply topically 2 (two) times daily. 40 g 2  ? fluticasone (FLONASE) 50 MCG/ACT nasal spray Place into the nose.    ? levothyroxine (SYNTHROID) 25 MCG tablet Take 1-2 tablets (25-50 mcg total) by mouth daily. Take 25 mcg 2 times a week and 50 mcg twice a week    ? methocarbamol (ROBAXIN) 500 MG tablet Take by mouth. (Patient not taking: Reported on 03/02/2021)    ? naproxen sodium (ALEVE) 220 MG tablet Take by mouth. (Patient not taking: Reported on 03/02/2021)    ? nystatin-triamcinolone (MYCOLOG II) cream APPLY TO THE AFFECTED AREA TWICE DAILY AS NEEDED FOR MORNING AND EVENING (Patient not taking: Reported on 03/02/2021)    ? Omega-3 Fatty Acids (FISH OIL PO) Take by mouth.    ? Selenium 100 MCG CAPS Take 1 capsule (100 mcg total) by mouth daily.  0  ? sulfamethoxazole-trimethoprim (BACTRIM DS) 800-160 MG tablet Take 1 tablet by mouth 2 (two) times daily. (Patient not taking: Reported on 03/02/2021)    ? traMADol (ULTRAM) 50 MG tablet Take by mouth. (Patient not taking: Reported on 03/02/2021)    ? ?No current facility-administered medications for this visit.  ? ? ?PHYSICAL EXAMINATION: ?ECOG PERFORMANCE STATUS: 1 - Symptomatic but completely ambulatory ? ?Vitals:  ?  03/14/22 1129  ?BP: 130/73  ?Pulse: 62  ?Resp: 18  ?Temp: 97.7 ?F (36.5 ?C)  ?SpO2: 99%  ? ?Filed Weights  ? 03/14/22 1129  ?Weight: 138 lb 6.4 oz (62.8 kg)  ? ?  ? ?LABORATORY DATA:  ?I have reviewed the data as listed ?   ? View : No data to display.  ?  ?  ?  ? ? ?Lab Results  ?Component Value Date  ? WBC 6.4 09/07/2018  ? HGB 12.7 09/07/2018  ? HCT 38.2 09/07/2018  ? MCV 97.0 09/07/2018  ? PLT 245 09/07/2018  ? NEUTROABS 3.6  09/07/2018  ? ? ?ASSESSMENT & PLAN:  ?Malignant neoplasm of upper-outer quadrant of left breast in female, estrogen receptor positive ([place]) ?08/18/2020:Left mastectomy with recontruction [name]): 1 cm IDC, grade 3, with high grade DCIS, clear margins, 3 left axillary lymph nodes negative for carcinoma.  ER 95%, PR 80%, HER-2 negative, Ki-67 30% ?Oncotype DX recurrence score: 22: Distant recurrence at 9 years: 8% (after discussing risks and benefits, patient decided not to receive chemotherapy) ?  ?Treatment plan: Tamoxifen 10 mg daily x10 years started 08/26/2020 discontinued December 2021 (mood swings and vaginal dryness) ?  ?Breast cancer surveillance: Mammogram right breast 03/08/2022: Benign breast density category C ?We discussed the role of Signatera blood test to detect circulating tumor DNA.  She will think about this and will inform us of her decision.  She is still not keen on taking any antiestrogen therapy.  We discussed with her that if the [place] shows any concern for recurrence and we should start antiestrogen therapy. ?  ?Return to clinic in 1 year for follow-up ? ?No orders of the defined types were placed in this encounter. ? ?The patient has a good understanding of the overall plan. she agrees with it. she will call with any problems that may develop before the next visit here. ?Total time spent: 30 mins including face to face time and time spent for planning, charting and co-ordination of care ? ? Harriette Ohara, MD ?03/14/22 ? ? ? I Gardiner Coins am scribing for Dr. Lindi Adie ? ?I have reviewed the above documentation for accuracy and completeness, and I agree with the above. ?. ?

## 2022-03-14 ENCOUNTER — Other Ambulatory Visit: Payer: Self-pay

## 2022-03-14 ENCOUNTER — Inpatient Hospital Stay: Payer: PRIVATE HEALTH INSURANCE | Attending: Hematology and Oncology | Admitting: Hematology and Oncology

## 2022-03-14 DIAGNOSIS — Z79899 Other long term (current) drug therapy: Secondary | ICD-10-CM | POA: Insufficient documentation

## 2022-03-14 DIAGNOSIS — Z170 Estrogen receptor positive status [ER+]: Secondary | ICD-10-CM

## 2022-03-14 DIAGNOSIS — Z9012 Acquired absence of left breast and nipple: Secondary | ICD-10-CM | POA: Diagnosis not present

## 2022-03-14 DIAGNOSIS — C50412 Malignant neoplasm of upper-outer quadrant of left female breast: Secondary | ICD-10-CM | POA: Diagnosis not present

## 2022-03-14 DIAGNOSIS — Z86000 Personal history of in-situ neoplasm of breast: Secondary | ICD-10-CM | POA: Diagnosis present

## 2022-03-14 NOTE — Assessment & Plan Note (Signed)
08/18/2020:Left mastectomy with recontruction [facility] [name]): 1 cm IDC, grade 3, with high grade DCIS, clear margins, 3 left axillary lymph nodes negative for carcinoma.??ER 95%, PR 80%, HER-2 negative, Ki-67 30% ?Oncotype DX recurrence score: 22: Distant recurrence at 9 years: 8% (after discussing risks and benefits, patient decided not to receive chemotherapy) ?? ?Treatment plan: Tamoxifen 10 mg daily x10 years started 08/26/2020 ?Tamoxifen toxicities: ?1.  Profound mood swings and irritability ?2. hot flashes ?3.  Vaginal dryness ?? ?After lengthy discussion about risks and benefits of antiestrogen therapy, she decided that she does not want to take it any further.  She has changed her lifestyle and plans to exercise regularly, take supplements that can help reduce breast cancer risk by turmeric and vitamin D and eat nonprocessed healthy food including less red meat and less alcohol. ?? ?Breast cancer surveillance: Mammogram right breast 03/08/2022: Benign breast density category C ?Our plan is to get a breast MRI in August 2023.  We will do MRIs every 2 to 3 years. ?? ?Return to clinic in 1 year for follow-up ?

## 2022-08-25 ENCOUNTER — Other Ambulatory Visit (HOSPITAL_COMMUNITY): Payer: Self-pay

## 2022-08-25 MED ORDER — MEFLOQUINE HCL 250 MG PO TABS
250.0000 mg | ORAL_TABLET | Freq: Every day | ORAL | 0 refills | Status: DC
  Filled 2022-08-25: qty 30, 30d supply, fill #0

## 2022-10-31 ENCOUNTER — Other Ambulatory Visit (HOSPITAL_COMMUNITY): Payer: Self-pay

## 2022-11-01 ENCOUNTER — Other Ambulatory Visit (HOSPITAL_COMMUNITY): Payer: Self-pay

## 2022-11-03 ENCOUNTER — Other Ambulatory Visit (HOSPITAL_COMMUNITY): Payer: Self-pay

## 2022-11-03 MED ORDER — AZITHROMYCIN 500 MG PO TABS
ORAL_TABLET | ORAL | 0 refills | Status: DC
  Filled 2022-11-03: qty 8, 6d supply, fill #0

## 2022-11-03 MED ORDER — MEFLOQUINE HCL 250 MG PO TABS
250.0000 mg | ORAL_TABLET | ORAL | 0 refills | Status: DC
  Filled 2022-11-03: qty 9, 63d supply, fill #0

## 2022-11-06 ENCOUNTER — Other Ambulatory Visit (HOSPITAL_COMMUNITY): Payer: Self-pay

## 2022-11-13 ENCOUNTER — Other Ambulatory Visit (HOSPITAL_COMMUNITY): Payer: Self-pay

## 2022-11-14 ENCOUNTER — Other Ambulatory Visit (HOSPITAL_COMMUNITY): Payer: Self-pay

## 2023-01-19 ENCOUNTER — Other Ambulatory Visit: Payer: Self-pay | Admitting: Hematology and Oncology

## 2023-01-19 DIAGNOSIS — Z1231 Encounter for screening mammogram for malignant neoplasm of breast: Secondary | ICD-10-CM

## 2023-03-16 ENCOUNTER — Telehealth: Payer: Self-pay | Admitting: *Deleted

## 2023-03-16 ENCOUNTER — Telehealth: Payer: Self-pay | Admitting: Hematology and Oncology

## 2023-03-16 NOTE — Telephone Encounter (Signed)
Scheduled appointment per scheduling message. Left voicemail.  

## 2023-03-16 NOTE — Telephone Encounter (Signed)
Received message from patient that she needed to change her appt with Dr. Lindi Adie on 4/9. Called and spoke with patient and she needs the 1st or 4th Wed and/or Friday of the month. Informed her I sent a schedule message to have the appt r/s. Patient verbalized understanding.

## 2023-03-27 ENCOUNTER — Ambulatory Visit: Payer: PRIVATE HEALTH INSURANCE | Admitting: Hematology and Oncology

## 2023-04-06 ENCOUNTER — Ambulatory Visit
Admission: RE | Admit: 2023-04-06 | Discharge: 2023-04-06 | Disposition: A | Discharge status: Home / Self Care | Payer: PRIVATE HEALTH INSURANCE | Source: Ambulatory Visit | Attending: Hematology and Oncology | Admitting: Hematology and Oncology

## 2023-04-06 DIAGNOSIS — Z1231 Encounter for screening mammogram for malignant neoplasm of breast: Secondary | ICD-10-CM

## 2023-04-20 ENCOUNTER — Inpatient Hospital Stay: Payer: PRIVATE HEALTH INSURANCE | Attending: Hematology and Oncology | Admitting: Hematology and Oncology

## 2023-04-20 VITALS — BP 104/55 | HR 58 | Temp 97.3°F | Resp 18 | Ht 65.5 in | Wt 141.0 lb

## 2023-04-20 DIAGNOSIS — Z170 Estrogen receptor positive status [ER+]: Secondary | ICD-10-CM

## 2023-04-20 DIAGNOSIS — C50412 Malignant neoplasm of upper-outer quadrant of left female breast: Secondary | ICD-10-CM | POA: Diagnosis not present

## 2023-04-20 NOTE — Assessment & Plan Note (Signed)
08/18/2020:Left mastectomy with recontruction [facility] [name]): 1 cm IDC, grade 3, with high grade DCIS, clear margins, 3 left axillary lymph nodes negative for carcinoma.  ER 95%, PR 80%, HER-2 negative, Ki-67 30% Oncotype DX recurrence score: 22: Distant recurrence at 9 years: 8% (after discussing risks and benefits, patient decided not to receive chemotherapy)   Treatment plan: Tamoxifen 10 mg daily x10 years started 08/26/2020 discontinued December 2021 (mood swings and vaginal dryness)   Breast cancer surveillance:  Mammogram right breast 03/08/2022: Benign breast density category C Breast exam 04/20/2023: Benign We discussed the role of Signatera blood test to detect circulating tumor DNA.  She will think about this and will inform us of her decision.  She is still not keen on taking any antiestrogen therapy.  We discussed with her that if the [place] shows any concern for recurrence and we should start antiestrogen therapy.   Return to clinic in 1 year for follow-up

## 2023-04-20 NOTE — Progress Notes (Signed)
Patient Care Team: Adrian Prince, MD as PCP - General (Endocrinology) Pershing Proud, RN as Oncology Nurse Navigator Donnelly Angelica, RN as Oncology Nurse Navigator  DIAGNOSIS:  Encounter Diagnosis  Name Primary?   Malignant neoplasm of upper-outer quadrant of left breast in female, estrogen receptor positive (HCC) Yes    SUMMARY OF ONCOLOGIC HISTORY: Oncology History  Malignant neoplasm of upper-outer quadrant of left breast in female, estrogen receptor positive (HCC)  03/02/2020 Initial Diagnosis   Screening mammogram revealed left breast calcifications 5.8 cm overall.  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    03/28/2020 Breast MRI   two sites of biopsy proven disease in the upper outer left breast with subtle non-mass enhancement extending between them suspicious for invasive disease.  Additional 1 cm nodule between the 2 biopsy sites.  Total span 10 cm   05/19/2020 Initial Biopsy   Left breast biopsy UOQ: Grade 3 IDC with DCIS, LOQ: DCIS with calcifications and necrosis, invasive cancer ER 95%, PR 80%, Ki-67 30%, HER-2 negative (1+)   05/24/2020 Cancer Staging   Staging form: Breast, AJCC 8th Edition - Clinical: Stage IA (cT1b, cN0, cM0, G3, ER+, PR+, HER2-) - Signed by Serena Croissant, MD on 05/24/2020   08/18/2020 Surgery   Left mastectomy with recontruction [name]): 1 cm IDC, grade 3, with high grade DCIS, clear margins, 3 left axillary lymph nodes negative for carcinoma.     CHIEF COMPLIANT:  Follow-up of left breast DCIS      INTERVAL HISTORY: Tracy Guerra is a 47 y.o. with above-mentioned history of left breast DCIS currently on neoadjuvant antiestrogen therapy, left mastectomy with reconstruction . She presents to the clinic for a follow-up.  She is feeling very well without any problems or concerns.  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  ALLERGIES:  is allergic to avelox [moxifloxacin hcl in  nacl].  MEDICATIONS:  Current Outpatient Medications  Medication Sig Dispense Refill   acetaminophen (TYLENOL) 500 MG tablet Take by mouth.     amphetamine-dextroamphetamine (ADDERALL) 10 MG tablet Take 1 tablet every day by oral route.     azithromycin (ZITHROMAX) 500 MG tablet For nonbloody diarrhea, take 2 tablets by mouth on day 1, if resolved stop medication. If diarrhea persists take 1 tablet on day 2 & 3. For bloody diarrhea take 2 tablets on day 1 and 1 tablet on day 2 & 3 8 tablet 0   Cholecalciferol (VITAMIN D) 2000 UNITS CAPS Take 1 capsule by mouth daily.     fluorouracil (EFUDEX) 5 % cream Apply topically 2 (two) times daily. 40 g 2   fluticasone (FLONASE) 50 MCG/ACT nasal spray Place into the nose.     levothyroxine (SYNTHROID) 25 MCG tablet Take 1-2 tablets (25-50 mcg total) by mouth daily. Take 25 mcg 2 times a week and 50 mcg twice a week     mefloquine (LARIAM) 250 MG tablet Take 1 tablet (250 mg total) by mouth once a week starting 2 weeks before travel and continue for 4 weeks after return for prevention of malaria 9 tablet 0   methocarbamol (ROBAXIN) 500 MG tablet Take by mouth.     naproxen sodium (ALEVE) 220 MG tablet Take by mouth.     nystatin-triamcinolone (MYCOLOG II) cream      Omega-3 Fatty Acids (FISH OIL PO) Take by mouth.     Selenium 100 MCG CAPS Take 1 capsule (100 mcg total) by  mouth daily.  0   sulfamethoxazole-trimethoprim (BACTRIM DS) 800-160 MG tablet Take 1 tablet by mouth 2 (two) times daily.     traMADol (ULTRAM) 50 MG tablet Take by mouth.     No current facility-administered medications for this visit.    PHYSICAL EXAMINATION: ECOG PERFORMANCE STATUS: 0 - Asymptomatic  Vitals:   04/20/23 0905  BP: (!) 104/55  Pulse: (!) 58  Resp: 18  Temp: (!) 97.3 F (36.3 C)  SpO2: 100%   Filed Weights   04/20/23 0905  Weight: 141 lb (64 kg)      LABORATORY DATA:  I have reviewed the data as listed     No data to display          Lab  Results  Component Value Date   WBC 6.4 09/07/2018   HGB 12.7 09/07/2018   HCT 38.2 09/07/2018   MCV 97.0 09/07/2018   PLT 245 09/07/2018   NEUTROABS 3.6 09/07/2018    ASSESSMENT & PLAN:  Malignant neoplasm of upper-outer quadrant of left breast in female, estrogen receptor positive (HCC) 08/18/2020:Left mastectomy with recontruction [name]): 1 cm IDC, grade 3, with high grade DCIS, clear margins, 3 left axillary lymph nodes negative for carcinoma.  ER 95%, PR 80%, HER-2 negative, Ki-67 30% Oncotype DX recurrence score: 22: Distant recurrence at 9 years: 8% (after discussing risks and benefits, patient decided not to receive chemotherapy)   Treatment plan: Tamoxifen 10 mg daily x10 years started 08/26/2020 discontinued December 2021 (mood swings and vaginal dryness)   Breast cancer surveillance:  Mammogram right breast 03/08/2022: Benign breast density category C  We discussed the role of Signatera blood test to detect circulating tumor DNA.  She will think about this and will inform us of her decision.  She is still not keen on taking any antiestrogen therapy.    Patient is contemplating on undergoing another surgery for the breast reconstruction because the implant appears to be sliding.  She stays very active and has been practicing golf. Return to clinic in 1 year for follow-up    No orders of the defined types were placed in this encounter.  The patient has a good understanding of the overall plan. she agrees with it. she will call with any problems that may develop before the next visit here. Total time spent: 30 mins including face to face time and time spent for planning, charting and co-ordination of care   Tamsen Meek, MD 04/20/23    I Janan Ridge am acting as a Neurosurgeon for [employer]  I have reviewed the above documentation for accuracy and completeness, and I agree with the above.

## 2023-08-08 NOTE — H&P (Signed)
Subjective Patient ID: Tracy Guerra is a 47 y.o. female.     HPI   3 y post op left mastectomy with immediate reconstruction. Patient scheduled for left revision reconstruction surgery next month as well as excision lesions right clavicle and right dorsal ankle.   No prior biopsy, no history ulceration or bleeding right clavicle lesion. Has grown with time. Patient reports right dorsal ankle mass present for years. Was counsled it was cyst. Reports this as tender to palpation.   Presented following screening MMG 02/2020 with left breast calcifications. Diagnostic mammogram on 03/02/20 showed left breast calcifications spanning 5.8 cm. 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   MRI demonstrated the two sites of biopsy proven disease left UOQ with NME extending between them suspicious for invasive disease. Second look US showed no discrete mass. MR guided biopsies completed labeled left UO showed IDC with DCIS, ER/PR+, Her2-  and left lower outer showed DCIS with calcs and necrosis high grade.   Oncotype on core bx 22/8% Oncology discussed systemic chemotherapy vs ovarian function suppression. Maintained on tamoxifen prior to surgery. Discontinued tamoxifen due to side effects.   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 Course complicated by axillary seroma drained x 8.    Mother with history DCIS, underwent lumpectomy/RT. Cousin with ovarian ca. Genetics negative   Prior 32 C/D. 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    Pulmonary/Chest Effort normal and breath sounds normal.    Chest:    Left reconstruction wider than right breast Left chest with palpable area consistent  with implant stamp In supine position few cm lateral movement implant, implant lower on chest than right breast footprint  SN to nipple R 20 L 24.5 cm BW R 16.5 L 17.5 cm Nipple to IMF R and L 8 cm to scar, 10 cm to fold    MS: right chest linear papule pigmented lesion 1.2 cm Right dorsal ankle firm mobile mass 1 cm no punctum   Distal leg with additional lesion verrucous in appearance.          Assessment/Plan Left breast ca UOQ ER+ S/p left NSM, SLN, prepectoral TE/ADM (Alloderm) reconstruction S/p left silicone implant exchange, right mastopexy Left AP malposition Pigmented skin lesion  Dermoid cyst   Plan excision right chest lesion, right ankle lesion, revision left breast reconstruction with placement saline implant, NAC reconstruction as full thickness skin graft, and possible acellular dermis to left chest.     Reviewed AP malposition not dangerous and may be flipping repeatedly. Reviewed treatment of this is same as we have discussed for implant displacement as below.    Left reconstruction width greater than right- to narrow this would require resection mastectomy flap vertically and produce similar anchor type scars as right breast. This may include resection NAC, placement NAC as FTSG.  Plan possible additional use ADM for displacement. This would involve drain placement. Reviewed this is is off label use ADM. Also discussed bolster dressing over graft and restrictions on shower. Reviewed risk loss color or hypopigmentation variable pigmentation areola and nipple in setting FTSG.   Patient has elected to switch to saline implant. Benefit of saline  is no need for imaging for implant surveillance. Risk would be more rippling. Plan smaller volume implant as she has reported left breast volume larger than right.Completed Natrelle Saline physician patient checklist.    Rx for tramadol robaxin and Bactrim given.    Glenna Fellows, MD [facility] Plastic & Reconstructive  Surgery  Office/ physician access line after hours [phone]    Natrelle Soft Touch Smooth Round Full Projection 415 ml implant placed in left chest. REF [id number]

## 2023-08-16 ENCOUNTER — Other Ambulatory Visit: Payer: Self-pay

## 2023-08-16 ENCOUNTER — Encounter (HOSPITAL_BASED_OUTPATIENT_CLINIC_OR_DEPARTMENT_OTHER): Payer: Self-pay | Admitting: Plastic Surgery

## 2023-08-22 MED ORDER — CHLORHEXIDINE GLUCONATE CLOTH 2 % EX PADS: 6.0000 | MEDICATED_PAD | Freq: Once | CUTANEOUS | Status: DC

## 2023-08-22 NOTE — Progress Notes (Signed)

## 2023-08-23 NOTE — Anesthesia Preprocedure Evaluation (Addendum)
Anesthesia Evaluation  Patient identified by MRN, date of birth, ID band Patient awake    Reviewed: Allergy & Precautions, NPO status , Patient's Chart, lab work & pertinent test results  History of Anesthesia Complications (+) PROLONGED EMERGENCE and history of anesthetic complications  Airway Mallampati: II  TM Distance: >3 FB Neck ROM: Full    Dental  (+) Dental Advisory Given   Pulmonary neg pulmonary ROS   Pulmonary exam normal breath sounds clear to auscultation       Cardiovascular negative cardio ROS  Rhythm:Regular Rate:Normal     Neuro/Psych negative neurological ROS     GI/Hepatic negative GI ROS, Neg liver ROS,,,  Endo/Other  neg diabetesHypothyroidism    Renal/GU negative Renal ROS     Musculoskeletal   Abdominal   Peds  Hematology negative hematology ROS (+)   Anesthesia Other Findings   Reproductive/Obstetrics H/o left breast cancer                             Anesthesia Physical Anesthesia Plan  ASA: 2  Anesthesia Plan: General   Post-op Pain Management: Tylenol PO (pre-op)*   Induction: Intravenous  PONV Risk Score and Plan: 3 and Ondansetron, Dexamethasone and Treatment may vary due to age or medical condition  Airway Management Planned: LMA  Additional Equipment:   Intra-op Plan:   Post-operative Plan: Extubation in OR  Informed Consent: I have reviewed the patients History and Physical, chart, labs and discussed the procedure including the risks, benefits and alternatives for the proposed anesthesia with the patient or authorized representative who has indicated his/her understanding and acceptance.     Dental advisory given  Plan Discussed with: CRNA and Anesthesiologist  Anesthesia Plan Comments: (Risks of general anesthesia discussed including, but not limited to, sore throat, hoarse voice, chipped/damaged teeth, injury to vocal cords, nausea  and vomiting, allergic reactions, lung infection, heart attack, stroke, and death. All questions answered. )       Anesthesia Quick Evaluation

## 2023-08-24 ENCOUNTER — Encounter (HOSPITAL_BASED_OUTPATIENT_CLINIC_OR_DEPARTMENT_OTHER): Payer: Self-pay | Admitting: Plastic Surgery

## 2023-08-24 ENCOUNTER — Ambulatory Visit (HOSPITAL_BASED_OUTPATIENT_CLINIC_OR_DEPARTMENT_OTHER): Payer: PRIVATE HEALTH INSURANCE | Admitting: Anesthesiology

## 2023-08-24 ENCOUNTER — Ambulatory Visit (HOSPITAL_BASED_OUTPATIENT_CLINIC_OR_DEPARTMENT_OTHER)
Admission: RE | Admit: 2023-08-24 | Discharge: 2023-08-24 | Disposition: A | Discharge status: Home / Self Care | Payer: PRIVATE HEALTH INSURANCE | Attending: Plastic Surgery | Admitting: Plastic Surgery

## 2023-08-24 ENCOUNTER — Other Ambulatory Visit: Payer: Self-pay

## 2023-08-24 ENCOUNTER — Encounter (HOSPITAL_BASED_OUTPATIENT_CLINIC_OR_DEPARTMENT_OTHER)
Admission: RE | Disposition: A | Discharge status: Home / Self Care | Payer: Self-pay | Source: Home / Self Care | Attending: Plastic Surgery

## 2023-08-24 DIAGNOSIS — Z853 Personal history of malignant neoplasm of breast: Secondary | ICD-10-CM | POA: Diagnosis not present

## 2023-08-24 DIAGNOSIS — Z01818 Encounter for other preprocedural examination: Secondary | ICD-10-CM

## 2023-08-24 DIAGNOSIS — D2121 Benign neoplasm of connective and other soft tissue of right lower limb, including hip: Secondary | ICD-10-CM | POA: Diagnosis not present

## 2023-08-24 DIAGNOSIS — Z9012 Acquired absence of left breast and nipple: Secondary | ICD-10-CM | POA: Insufficient documentation

## 2023-08-24 DIAGNOSIS — R2241 Localized swelling, mass and lump, right lower limb: Secondary | ICD-10-CM

## 2023-08-24 DIAGNOSIS — Z45812 Encounter for adjustment or removal of left breast implant: Secondary | ICD-10-CM | POA: Diagnosis present

## 2023-08-24 DIAGNOSIS — L821 Other seborrheic keratosis: Secondary | ICD-10-CM | POA: Insufficient documentation

## 2023-08-24 DIAGNOSIS — D225 Melanocytic nevi of trunk: Secondary | ICD-10-CM | POA: Diagnosis present

## 2023-08-24 HISTORY — PX: MASS EXCISION: SHX2000

## 2023-08-24 HISTORY — PX: LESION EXCISION WITH COMPLEX REPAIR: SHX6700

## 2023-08-24 LAB — POCT PREGNANCY, URINE: Preg Test, Ur: NEGATIVE

## 2023-08-24 SURGERY — EXCISION MASS
Anesthesia: General | Site: Chest | Laterality: Right

## 2023-08-24 MED ORDER — AMISULPRIDE (ANTIEMETIC) 5 MG/2ML IV SOLN: 10.0000 mg | Freq: Once | INTRAVENOUS | Status: DC | PRN

## 2023-08-24 MED ORDER — CEFAZOLIN SODIUM-DEXTROSE 2-4 GM/100ML-% IV SOLN
2.0000 g | INTRAVENOUS | Status: AC
  Administered 2023-08-24: 2 g via INTRAVENOUS

## 2023-08-24 MED ORDER — FENTANYL CITRATE (PF) 100 MCG/2ML IJ SOLN
25.0000 ug | INTRAMUSCULAR | Status: DC | PRN
  Administered 2023-08-24: 25 ug via INTRAVENOUS

## 2023-08-24 MED ORDER — DEXAMETHASONE SODIUM PHOSPHATE 4 MG/ML IJ SOLN
INTRAMUSCULAR | Status: DC | PRN
  Administered 2023-08-24: 5 mg via INTRAVENOUS

## 2023-08-24 MED ORDER — FENTANYL CITRATE (PF) 100 MCG/2ML IJ SOLN
INTRAMUSCULAR | Status: AC
  Filled 2023-08-24: qty 2

## 2023-08-24 MED ORDER — SODIUM CHLORIDE 0.9 % IV SOLN
INTRAVENOUS | Status: AC
  Filled 2023-08-24: qty 10

## 2023-08-24 MED ORDER — 0.9 % SODIUM CHLORIDE (POUR BTL) OPTIME
TOPICAL | Status: DC | PRN
Start: 2023-08-24 — End: 2023-08-24
  Administered 2023-08-24: 500 mL

## 2023-08-24 MED ORDER — BUPIVACAINE HCL (PF) 0.25 % IJ SOLN
INTRAMUSCULAR | Status: AC
  Filled 2023-08-24: qty 30

## 2023-08-24 MED ORDER — FENTANYL CITRATE (PF) 100 MCG/2ML IJ SOLN
INTRAMUSCULAR | Status: DC | PRN
  Administered 2023-08-24 (×4): 50 ug via INTRAVENOUS

## 2023-08-24 MED ORDER — SODIUM CHLORIDE 0.9 % IV SOLN
INTRAVENOUS | Status: AC | PRN
Start: 2023-08-24 — End: 2023-08-24
  Administered 2023-08-24: 375 mL

## 2023-08-24 MED ORDER — BUPIVACAINE HCL (PF) 0.5 % IJ SOLN
INTRAMUSCULAR | Status: AC
  Filled 2023-08-24: qty 30

## 2023-08-24 MED ORDER — PROPOFOL 500 MG/50ML IV EMUL
INTRAVENOUS | Status: DC | PRN
Start: 2023-08-24 — End: 2023-08-24
  Administered 2023-08-24: 25 ug/kg/min via INTRAVENOUS

## 2023-08-24 MED ORDER — CEFAZOLIN SODIUM-DEXTROSE 2-4 GM/100ML-% IV SOLN
INTRAVENOUS | Status: AC
  Filled 2023-08-24: qty 100

## 2023-08-24 MED ORDER — CELECOXIB 200 MG PO CAPS
ORAL_CAPSULE | ORAL | Status: AC
  Filled 2023-08-24: qty 1

## 2023-08-24 MED ORDER — GABAPENTIN 300 MG PO CAPS
ORAL_CAPSULE | ORAL | Status: AC
  Filled 2023-08-24: qty 1

## 2023-08-24 MED ORDER — ONDANSETRON HCL 4 MG/2ML IJ SOLN
INTRAMUSCULAR | Status: DC | PRN
  Administered 2023-08-24: 4 mg via INTRAVENOUS

## 2023-08-24 MED ORDER — DIPHENHYDRAMINE HCL 50 MG/ML IJ SOLN
INTRAMUSCULAR | Status: AC
  Filled 2023-08-24: qty 1

## 2023-08-24 MED ORDER — BACITRACIN ZINC 500 UNIT/GM EX OINT
TOPICAL_OINTMENT | CUTANEOUS | Status: AC
  Filled 2023-08-24: qty 1.8

## 2023-08-24 MED ORDER — PHENYLEPHRINE 80 MCG/ML (10ML) SYRINGE FOR IV PUSH (FOR BLOOD PRESSURE SUPPORT)
PREFILLED_SYRINGE | INTRAVENOUS | Status: AC
  Filled 2023-08-24: qty 10

## 2023-08-24 MED ORDER — GABAPENTIN 300 MG PO CAPS
300.0000 mg | ORAL_CAPSULE | ORAL | Status: AC
  Administered 2023-08-24: 300 mg via ORAL

## 2023-08-24 MED ORDER — DEXAMETHASONE SODIUM PHOSPHATE 10 MG/ML IJ SOLN
INTRAMUSCULAR | Status: AC
  Filled 2023-08-24: qty 1

## 2023-08-24 MED ORDER — SCOPOLAMINE 1 MG/3DAYS TD PT72
MEDICATED_PATCH | TRANSDERMAL | Status: DC | PRN
  Administered 2023-08-24: 1 via TRANSDERMAL

## 2023-08-24 MED ORDER — ONDANSETRON HCL 4 MG/2ML IJ SOLN
INTRAMUSCULAR | Status: AC
  Filled 2023-08-24: qty 2

## 2023-08-24 MED ORDER — EPHEDRINE 5 MG/ML INJ
INTRAVENOUS | Status: AC
  Filled 2023-08-24: qty 5

## 2023-08-24 MED ORDER — LACTATED RINGERS IV SOLN: INTRAVENOUS | Status: DC

## 2023-08-24 MED ORDER — OXYCODONE HCL 5 MG PO TABS: 5.0000 mg | ORAL_TABLET | Freq: Once | ORAL | Status: DC | PRN

## 2023-08-24 MED ORDER — ACETAMINOPHEN 500 MG PO TABS
ORAL_TABLET | ORAL | Status: AC
  Filled 2023-08-24: qty 2

## 2023-08-24 MED ORDER — LIDOCAINE-EPINEPHRINE 1 %-1:100000 IJ SOLN
INTRAMUSCULAR | Status: AC
  Filled 2023-08-24: qty 1

## 2023-08-24 MED ORDER — DIPHENHYDRAMINE HCL 50 MG/ML IJ SOLN
INTRAMUSCULAR | Status: DC | PRN
Start: 2023-08-24 — End: 2023-08-24
  Administered 2023-08-24: 6.25 mg via INTRAVENOUS

## 2023-08-24 MED ORDER — OXYCODONE HCL 5 MG/5ML PO SOLN: 5.0000 mg | Freq: Once | ORAL | Status: DC | PRN

## 2023-08-24 MED ORDER — LIDOCAINE 2% (20 MG/ML) 5 ML SYRINGE
INTRAMUSCULAR | Status: AC
  Filled 2023-08-24: qty 5

## 2023-08-24 MED ORDER — MIDAZOLAM HCL 2 MG/2ML IJ SOLN
INTRAMUSCULAR | Status: AC
  Filled 2023-08-24: qty 2

## 2023-08-24 MED ORDER — BUPIVACAINE HCL (PF) 0.5 % IJ SOLN
INTRAMUSCULAR | Status: DC | PRN
Start: 2023-08-24 — End: 2023-08-24
  Administered 2023-08-24: 23 mL

## 2023-08-24 MED ORDER — SODIUM CHLORIDE 0.9 % IV SOLN
INTRAVENOUS | Status: DC | PRN
  Administered 2023-08-24: 500 mL

## 2023-08-24 MED ORDER — ACETAMINOPHEN 500 MG PO TABS
1000.0000 mg | ORAL_TABLET | Freq: Once | ORAL | Status: AC
  Administered 2023-08-24: 1000 mg via ORAL

## 2023-08-24 MED ORDER — EPHEDRINE SULFATE (PRESSORS) 50 MG/ML IJ SOLN
INTRAMUSCULAR | Status: DC | PRN
Start: 2023-08-24 — End: 2023-08-24
  Administered 2023-08-24 (×2): 5 mg via INTRAVENOUS

## 2023-08-24 MED ORDER — MIDAZOLAM HCL 5 MG/5ML IJ SOLN
INTRAMUSCULAR | Status: DC | PRN
  Administered 2023-08-24: 2 mg via INTRAVENOUS

## 2023-08-24 MED ORDER — ATROPINE SULFATE 0.4 MG/ML IV SOLN
INTRAVENOUS | Status: AC
  Filled 2023-08-24: qty 1

## 2023-08-24 MED ORDER — PHENYLEPHRINE HCL (PRESSORS) 10 MG/ML IV SOLN
INTRAVENOUS | Status: DC | PRN
Start: 2023-08-24 — End: 2023-08-24
  Administered 2023-08-24: 80 ug via INTRAVENOUS

## 2023-08-24 MED ORDER — PROPOFOL 10 MG/ML IV BOLUS
INTRAVENOUS | Status: DC | PRN
  Administered 2023-08-24: 150 mg via INTRAVENOUS

## 2023-08-24 MED ORDER — LIDOCAINE HCL (CARDIAC) PF 100 MG/5ML IV SOSY
PREFILLED_SYRINGE | INTRAVENOUS | Status: DC | PRN
  Administered 2023-08-24: 80 mg via INTRAVENOUS

## 2023-08-24 MED ORDER — ACETAMINOPHEN 500 MG PO TABS: 1000.0000 mg | ORAL_TABLET | ORAL | Status: DC

## 2023-08-24 MED ORDER — CELECOXIB 200 MG PO CAPS
200.0000 mg | ORAL_CAPSULE | ORAL | Status: AC
  Administered 2023-08-24: 200 mg via ORAL

## 2023-08-24 MED ORDER — SCOPOLAMINE 1 MG/3DAYS TD PT72
MEDICATED_PATCH | TRANSDERMAL | Status: AC
  Filled 2023-08-24: qty 1

## 2023-08-24 MED ORDER — LIDOCAINE-EPINEPHRINE 1 %-1:100000 IJ SOLN
INTRAMUSCULAR | Status: DC | PRN
  Administered 2023-08-24: 3.5 mL

## 2023-08-24 SURGICAL SUPPLY — 111 items
ADH SKN CLS APL DERMABOND .7 (GAUZE/BANDAGES/DRESSINGS) ×8
APL PRP STRL LF DISP 70% ISPRP (MISCELLANEOUS) ×8
APL SKNCLS STERI-STRIP NONHPOA (GAUZE/BANDAGES/DRESSINGS)
BAG DECANTER FOR FLEXI CONT (MISCELLANEOUS) ×4 IMPLANT
BAND INSRT 18 STRL LF DISP RB (MISCELLANEOUS)
BAND RUBBER #18 3X1/16 STRL (MISCELLANEOUS) IMPLANT
BENZOIN TINCTURE PRP APPL 2/3 (GAUZE/BANDAGES/DRESSINGS) IMPLANT
BINDER BREAST LRG (GAUZE/BANDAGES/DRESSINGS) ×1 IMPLANT
BINDER BREAST MEDIUM (GAUZE/BANDAGES/DRESSINGS) IMPLANT
BINDER BREAST XLRG (GAUZE/BANDAGES/DRESSINGS) IMPLANT
BINDER BREAST XXLRG (GAUZE/BANDAGES/DRESSINGS) IMPLANT
BLADE CLIPPER SURG (BLADE) IMPLANT
BLADE SURG 10 STRL SS (BLADE) ×6 IMPLANT
BLADE SURG 11 STRL SS (BLADE) IMPLANT
BLADE SURG 15 STRL LF DISP TIS (BLADE) ×5 IMPLANT
BLADE SURG 15 STRL SS (BLADE) ×8
BNDG GAUZE DERMACEA FLUFF 4 (GAUZE/BANDAGES/DRESSINGS) ×8 IMPLANT
BNDG GZE DERMACEA 4 6PLY (GAUZE/BANDAGES/DRESSINGS) ×8
CANISTER SUCT 1200ML W/VALVE (MISCELLANEOUS) ×4 IMPLANT
CHLORAPREP W/TINT 26 (MISCELLANEOUS) ×5 IMPLANT
COVER BACK TABLE 60X90IN (DRAPES) ×4 IMPLANT
COVER MAYO STAND STRL (DRAPES) ×8 IMPLANT
DERMABOND ADVANCED .7 DNX12 (GAUZE/BANDAGES/DRESSINGS) ×5 IMPLANT
DRAIN CHANNEL 15F RND FF W/TCR (WOUND CARE) ×3 IMPLANT
DRAIN JP 10F RND SILICONE (MISCELLANEOUS) IMPLANT
DRAIN RELI 100 BL SUC LF ST (DRAIN)
DRAPE INCISE IOBAN 66X45 STRL (DRAPES) IMPLANT
DRAPE LAPAROTOMY 100X72 PEDS (DRAPES) IMPLANT
DRAPE TOP ARMCOVERS (MISCELLANEOUS) ×4 IMPLANT
DRAPE U-SHAPE 76X120 STRL (DRAPES) ×4 IMPLANT
DRAPE UTILITY XL STRL (DRAPES) ×4 IMPLANT
DRSG TEGADERM 2-3/8X2-3/4 SM (GAUZE/BANDAGES/DRESSINGS) ×1 IMPLANT
DRSG TEGADERM 4X10 (GAUZE/BANDAGES/DRESSINGS) IMPLANT
DRSG TEGADERM 4X4.75 (GAUZE/BANDAGES/DRESSINGS) IMPLANT
DRSG TELFA 3X8 NADH STRL (GAUZE/BANDAGES/DRESSINGS) IMPLANT
ELECT BLADE 4.0 EZ CLEAN MEGAD (MISCELLANEOUS)
ELECT COATED BLADE 2.86 ST (ELECTRODE) ×4 IMPLANT
ELECT NDL BLADE 2-5/6 (NEEDLE) ×3 IMPLANT
ELECT NEEDLE BLADE 2-5/6 (NEEDLE)
ELECT REM PT RETURN 9FT ADLT (ELECTROSURGICAL) ×4
ELECT REM PT RETURN 9FT PED (ELECTROSURGICAL)
ELECTRODE BLDE 4.0 EZ CLN MEGD (MISCELLANEOUS) ×3 IMPLANT
ELECTRODE REM PT RETRN 9FT PED (ELECTROSURGICAL) IMPLANT
ELECTRODE REM PT RTRN 9FT ADLT (ELECTROSURGICAL) ×3 IMPLANT
EVACUATOR SILICONE 100CC (DRAIN) ×3 IMPLANT
GAUZE PAD ABD 8X10 STRL (GAUZE/BANDAGES/DRESSINGS) ×8 IMPLANT
GAUZE SPONGE 2X2 STRL 8-PLY (GAUZE/BANDAGES/DRESSINGS) ×1 IMPLANT
GAUZE SPONGE 4X4 12PLY STRL LF (GAUZE/BANDAGES/DRESSINGS) IMPLANT
GAUZE XEROFORM 1X8 LF (GAUZE/BANDAGES/DRESSINGS) IMPLANT
GLOVE BIO SURGEON STRL SZ 6 (GLOVE) ×6 IMPLANT
GLOVE BIO SURGEON STRL SZ8 (GLOVE) ×2 IMPLANT
GLOVE BIOGEL PI IND STRL 7.0 (GLOVE) ×2 IMPLANT
GLOVE BIOGEL PI IND STRL 8 (GLOVE) ×1 IMPLANT
GOWN STRL REUS W/ TWL LRG LVL3 (GOWN DISPOSABLE) ×8 IMPLANT
GOWN STRL REUS W/ TWL XL LVL3 (GOWN DISPOSABLE) ×1 IMPLANT
GOWN STRL REUS W/TWL LRG LVL3 (GOWN DISPOSABLE) ×8
GOWN STRL REUS W/TWL XL LVL3 (GOWN DISPOSABLE) ×4
IMPL BREAST SALINE 360CC (Breast) IMPLANT
IMPLANT BREAST SALINE 360CC (Breast) ×4 IMPLANT
IV NS 500ML (IV SOLUTION) ×4
IV NS 500ML BAXH (IV SOLUTION) ×4 IMPLANT
KIT FILL ASEPTIC TRANSFER (MISCELLANEOUS) ×4 IMPLANT
MARKER SKIN DUAL TIP RULER LAB (MISCELLANEOUS) IMPLANT
NDL HYPO 25X1 1.5 SAFETY (NEEDLE) IMPLANT
NDL HYPO 27GX1-1/4 (NEEDLE) ×3 IMPLANT
NDL HYPO 30GX1 BEV (NEEDLE) IMPLANT
NEEDLE HYPO 25X1 1.5 SAFETY (NEEDLE) ×4
NEEDLE HYPO 27GX1-1/4 (NEEDLE) ×8
NEEDLE HYPO 30GX1 BEV (NEEDLE)
NS IRRIG 1000ML POUR BTL (IV SOLUTION) ×1 IMPLANT
PACK BASIN DAY SURGERY FS (CUSTOM PROCEDURE TRAY) ×4 IMPLANT
PENCIL SMOKE EVACUATOR (MISCELLANEOUS) ×4 IMPLANT
PIN SAFETY STERILE (MISCELLANEOUS) ×3 IMPLANT
PUNCH BIOPSY 4MM DISP (MISCELLANEOUS) IMPLANT
SHEET MEDIUM DRAPE 40X70 STRL (DRAPES) ×5 IMPLANT
SIZER BREAST SGL USE 360CC (SIZER) ×4
SIZER BRST SGL USE 360CC (SIZER) IMPLANT
SLEEVE SCD COMPRESS KNEE MED (STOCKING) ×4 IMPLANT
SPIKE FLUID TRANSFER (MISCELLANEOUS) IMPLANT
SPONGE T-LAP 18X18 ~~LOC~~+RFID (SPONGE) ×8 IMPLANT
STAPLER VISISTAT 35W (STAPLE) ×4 IMPLANT
STRIP CLOSURE SKIN 1/2X4 (GAUZE/BANDAGES/DRESSINGS) ×1 IMPLANT
SUCTION TUBE FRAZIER 10FR DISP (SUCTIONS) IMPLANT
SUT CHROMIC 4 0 PS 2 18 (SUTURE) IMPLANT
SUT ETHILON 2 0 FS 18 (SUTURE) IMPLANT
SUT ETHILON 4 0 PS 2 18 (SUTURE) IMPLANT
SUT MNCRL AB 4-0 PS2 18 (SUTURE) ×6 IMPLANT
SUT MON AB 5-0 P3 18 (SUTURE) IMPLANT
SUT PDS AB 2-0 CT2 27 (SUTURE) ×1 IMPLANT
SUT PLAIN 5 0 P 3 18 (SUTURE) IMPLANT
SUT PROLENE 5 0 P 3 (SUTURE) IMPLANT
SUT PROLENE 6 0 P 1 18 (SUTURE) IMPLANT
SUT VIC AB 3-0 PS1 18 (SUTURE) ×4
SUT VIC AB 3-0 PS1 18XBRD (SUTURE) ×1 IMPLANT
SUT VIC AB 3-0 SH 27 (SUTURE) ×4
SUT VIC AB 3-0 SH 27X BRD (SUTURE) ×4 IMPLANT
SUT VIC AB 4-0 PS2 18 (SUTURE) ×4 IMPLANT
SUT VICRYL 0 CT-2 (SUTURE) IMPLANT
SUT VLOC 180 0 24IN GS25 (SUTURE) IMPLANT
SWAB COLLECTION DEVICE MRSA (MISCELLANEOUS) IMPLANT
SWAB CULTURE ESWAB REG 1ML (MISCELLANEOUS) IMPLANT
SWABSTICK POVIDONE IODINE SNGL (MISCELLANEOUS) ×2 IMPLANT
SYR BULB EAR ULCER 3OZ GRN STR (SYRINGE) IMPLANT
SYR BULB IRRIG 60ML STRL (SYRINGE) ×4 IMPLANT
SYR CONTROL 10ML LL (SYRINGE) ×5 IMPLANT
TAPE MEASURE VINYL STERILE (MISCELLANEOUS) ×3 IMPLANT
TOWEL GREEN STERILE FF (TOWEL DISPOSABLE) ×8 IMPLANT
TRAY DSU PREP LF (CUSTOM PROCEDURE TRAY) IMPLANT
TUBE CONNECTING 20X1/4 (TUBING) ×4 IMPLANT
UNDERPAD 30X36 HEAVY ABSORB (UNDERPADS AND DIAPERS) ×8 IMPLANT
YANKAUER SUCT BULB TIP NO VENT (SUCTIONS) ×4 IMPLANT

## 2023-08-24 NOTE — Interval H&P Note (Signed)
History and Physical Interval Note:  08/24/2023 6:52 AM  Tracy Guerra  has presented today for surgery, with the diagnosis of history breast ca acquired absence breast,PIGMENTED NEVUS RIGTH CHEST,CYST DERMOID RIGHT ANKLE.  The various methods of treatment have been discussed with the patient and family. After consideration of risks, benefits and other options for treatment, the patient has consented to  revision left breast reconstruction with placement saline implant possible acellular dermis to left chest reconstruction left nipple areola complex as graft excision right chest lesion excision right ankle lesion as a surgical intervention.  The patient's history has been reviewed, patient examined, no change in status, stable for surgery.  I have reviewed the patient's chart and labs.  Questions were answered to the patient's satisfaction.     Tracy Guerra

## 2023-08-24 NOTE — Anesthesia Procedure Notes (Signed)
Procedure Name: LMA Insertion Date/Time: 08/24/2023 7:42 AM  Performed by: Ronnette Hila, CRNAPre-anesthesia Checklist: Patient identified, Emergency Drugs available, Suction available and Patient being monitored Patient Re-evaluated:Patient Re-evaluated prior to induction Oxygen Delivery Method: Circle System Utilized Preoxygenation: Pre-oxygenation with 100% oxygen Induction Type: IV induction Ventilation: Mask ventilation without difficulty LMA: LMA inserted LMA Size: 4.0 Number of attempts: 1 Airway Equipment and Method: bite block Placement Confirmation: positive ETCO2 Tube secured with: Tape Dental Injury: Teeth and Oropharynx as per pre-operative assessment

## 2023-08-24 NOTE — Discharge Instructions (Signed)
  Post Anesthesia Home Care Instructions  Activity: Get plenty of rest for the remainder of the day. A responsible individual must stay with you for 24 hours following the procedure.  For the next 24 hours, DO NOT: -Drive a car -Advertising copywriter -Drink alcoholic beverages -Take any medication unless instructed by your physician -Make any legal decisions or sign important papers.  Meals: Start with liquid foods such as gelatin or soup. Progress to regular foods as tolerated. Avoid greasy, spicy, heavy foods. If nausea and/or vomiting occur, drink only clear liquids until the nausea and/or vomiting subsides. Call your physician if vomiting continues.  Special Instructions/Symptoms: Your throat may feel dry or sore from the anesthesia or the breathing tube placed in your throat during surgery. If this causes discomfort, gargle with warm salt water. The discomfort should disappear within 24 hours.  If you had a scopolamine patch placed behind your ear for the management of post- operative nausea and/or vomiting:  1. The medication in the patch is effective for 72 hours, after which it should be removed.  Wrap patch in a tissue and discard in the trash. Wash hands thoroughly with soap and water. 2. You may remove the patch earlier than 72 hours if you experience unpleasant side effects which may include dry mouth, dizziness or visual disturbances. 3. Avoid touching the patch. Wash your hands with soap and water after contact with the patch.    May have Tylenol today after 12:40 PM May have Ibuprofen today after 12:40 PM

## 2023-08-24 NOTE — Anesthesia Postprocedure Evaluation (Signed)
Anesthesia Post Note  Patient: Tracy Guerra  Procedure(s) Performed: EXCISION BENIGN LESION RIGHT ANKLE WITH LAYERED CLOSURE (Right: Ankle) EXCISION BENIGN LESION RIGHT CHEST WITH LAYERED CLOSURE (Right: Chest) REVISION OF LEFT BREAST RECONSTRUCTION WITH IMMEDIATE PLACEMENT OF SALINE IMPLANT (Left: Breast)     Patient location during evaluation: PACU Anesthesia Type: General Level of consciousness: awake Pain management: pain level controlled Vital Signs Assessment: post-procedure vital signs reviewed and stable Respiratory status: spontaneous breathing, nonlabored ventilation and respiratory function stable Cardiovascular status: blood pressure returned to baseline and stable Postop Assessment: no apparent nausea or vomiting Anesthetic complications: no   No notable events documented.  Last Vitals:  Vitals:   08/24/23 1215 08/24/23 1235  BP:  (!) 108/93  Pulse: 80 76  Resp: 17 18  Temp:  (!) 36.2 C  SpO2: 100% 96%    Last Pain:  Vitals:   08/24/23 1235  TempSrc:   PainSc: 2                  Linton Rump

## 2023-08-24 NOTE — Op Note (Signed)
Operative Note   DATE OF OPERATION: 9.6.2024  LOCATION: [facility]-outpatient  SURGICAL DIVISION: Plastic Surgery  PREOPERATIVE DIAGNOSES:  1. History breast cancer 2. Acquired absence breast 3. Pigmented nevus right chest 4. Subcutaneous mass right ankle  POSTOPERATIVE DIAGNOSES:  same  PROCEDURE:  1. Excision benign lesion right chest 1.5 cm 2. Layered closure right chest 2 cm 3. Excision subcutanesous mass right ankle 1 cm 4. Revision left breast reconstruction with removal left chest silicone implant, placement saline implant 5. Revision left breast reconstruction with adjacent tissue transfer 50 cm2  SURGEON: Glenna Fellows MD MBA  ASSISTANT: none  ANESTHESIA:  General.   EBL: 55 ml  COMPLICATIONS: None immediate.   INDICATIONS FOR PROCEDURE:  The patient, Tracy Guerra, is a 47 y.o. female born on July 02, 1976, is here for revision left breast reconstruction following nipple sparing mastectomy and implant based reconstruction. She also desires removal additional soft tissue lesions.   FINDINGS: Right ankle lesion clinically ganglion cyst. Removed intact smooth silicone implant. Placed smooth round Natrelle Saline Moderate Profile 360 ml implant. REF [id number] fill volume 375 ml saline SN [id number]  DESCRIPTION OF PROCEDURE:  The patient's operative sites were marked with the patient in the preoperative area. This included marking of desired location nipple areola complex over left chest, symmetric with right.  The patient was taken to the operating room. SCDs were placed and IV antibiotics were given. The patient's operative site was prepped and draped in a sterile fashion. A time out was performed and all information was confirmed to be correct. Local anesthetic infiltrated surrounding right ankle lesion. Incision made in natural skin tension line over mass. Skin flaps elevated and mass excised diameter 1 cm. Layered closure completed with 4-0 monocryl in dermis followed by  4-0 monocryl subcuticular. Steris applied followed by dry dressing. The chest was then prepped and draped in a sterile fashion. Local anesthetic infiltrated surrounding right chest skin lesion. Excision right chest pigmented lesion completed sharply with borders, diameter 1.5 cm. Layered closure completed with 4-0 monocryl in dermis followed by 4-0 monocryl subcuticular.   Over left chest, Wise pattern skin markings made to address skin envelope that was wider and more ptotic than right. Area marked was de epithelialized. Vertical incision made in midline. Intact smooth silicone implant removed. Capsulotomies performed superiorly and medially. Thermal capsulorraphy performed over lateral capsule. A sizer was placed and patient brought to upright sitting position. A moderate profile 360 ml implant selected for placement. Patient returned to supine position. Local anesthetic infiltrated in left chest cavity. Cavity irrigated with saline solution containing Ancef, gentamicin, and Betadine. Hemostasis ensured. Implant prepared and placed in cavity. Implant filled to 375 ml total fill volume. Fill tubing removed. Fill tab closure and implant orientation ensured. Closure capsule and superficial fascia over implant completed with interrupted 2-0 PDS suture. Incision made in dermis at borders of de epithelialized skin to allow for superior movement of NAC. A 42 mm diameter marker use to mark new location for NAC. This area was de epithelialized. The original location of NAC was closed primarily with interrupted 3-0 vicryl in dermis. The NAC was inset with interrupted 4-0 vicryl in dermis. Total area for dermal flap advancement was 50 cm2. The inframammary fold was closed with running 3-0 vicryl. Skin closure completed with 4-0 monocryl subcuticular throughout. Tissue adhesive applied to  all chest incisions. Dry dressing applied followed by breast binder.   The patient was allowed to wake from anesthesia, extubated and  taken to the  recovery room in satisfactory condition.   SPECIMENS: 1. Right ankle lesion 2. Right chest lesion 3. Left mastectomy flap  DRAINS: none  Glenna Fellows, MD [facility] Plastic & Reconstructive Surgery  Office/ physician access line after hours [phone]

## 2023-08-24 NOTE — Transfer of Care (Signed)
Immediate Anesthesia Transfer of Care Note  Patient: Tracy Guerra  Procedure(s) Performed: EXCISION BENIGN LESION RIGHT ANKLE WITH LAYERED CLOSURE (Right: Ankle) EXCISION BENIGN LESION RIGHT CHEST WITH LAYERED CLOSURE (Right: Chest) REVISION OF LEFT BREAST RECONSTRUCTION WITH IMMEDIATE PLACEMENT OF SALINE IMPLANT (Left: Breast)  Patient Location: PACU  Anesthesia Type:General  Level of Consciousness: awake, alert , oriented, drowsy, and patient cooperative  Airway & Oxygen Therapy: Patient Spontanous Breathing and Patient connected to face mask oxygen  Post-op Assessment: Report given to RN and Post -op Vital signs reviewed and stable  Post vital signs: Reviewed and stable  Last Vitals:  Vitals Value Taken Time  BP 115/73 08/24/23 1049  Temp    Pulse 83 08/24/23 1049  Resp 10 08/24/23 1049  SpO2 100 % 08/24/23 1049  Vitals shown include unfiled device data.  Last Pain:  Vitals:   08/24/23 0638  TempSrc: Oral  PainSc: 0-No pain      Patients Stated Pain Goal: 3 (08/24/23 1610)  Complications: No notable events documented.

## 2023-08-27 ENCOUNTER — Encounter (HOSPITAL_BASED_OUTPATIENT_CLINIC_OR_DEPARTMENT_OTHER): Payer: Self-pay | Admitting: Plastic Surgery

## 2023-08-27 LAB — SURGICAL PATHOLOGY

## 2024-01-18 DIAGNOSIS — E785 Hyperlipidemia, unspecified: Secondary | ICD-10-CM | POA: Diagnosis not present

## 2024-01-18 DIAGNOSIS — E039 Hypothyroidism, unspecified: Secondary | ICD-10-CM | POA: Diagnosis not present

## 2024-01-25 DIAGNOSIS — Z0000 Encounter for general adult medical examination without abnormal findings: Secondary | ICD-10-CM | POA: Diagnosis not present

## 2024-01-25 DIAGNOSIS — R82998 Other abnormal findings in urine: Secondary | ICD-10-CM | POA: Diagnosis not present

## 2024-01-25 DIAGNOSIS — C50412 Malignant neoplasm of upper-outer quadrant of left female breast: Secondary | ICD-10-CM | POA: Diagnosis not present

## 2024-01-25 DIAGNOSIS — Z1331 Encounter for screening for depression: Secondary | ICD-10-CM | POA: Diagnosis not present

## 2024-01-29 DIAGNOSIS — Z6825 Body mass index (BMI) 25.0-25.9, adult: Secondary | ICD-10-CM | POA: Diagnosis not present

## 2024-01-29 DIAGNOSIS — Z01419 Encounter for gynecological examination (general) (routine) without abnormal findings: Secondary | ICD-10-CM | POA: Diagnosis not present

## 2024-01-29 DIAGNOSIS — R6882 Decreased libido: Secondary | ICD-10-CM | POA: Diagnosis not present

## 2024-01-29 DIAGNOSIS — Z124 Encounter for screening for malignant neoplasm of cervix: Secondary | ICD-10-CM | POA: Diagnosis not present

## 2024-02-14 ENCOUNTER — Encounter: Payer: Self-pay | Admitting: Hematology and Oncology

## 2024-02-14 ENCOUNTER — Other Ambulatory Visit: Payer: Self-pay | Admitting: Hematology and Oncology

## 2024-02-14 DIAGNOSIS — Z1231 Encounter for screening mammogram for malignant neoplasm of breast: Secondary | ICD-10-CM

## 2024-03-04 DIAGNOSIS — H6123 Impacted cerumen, bilateral: Secondary | ICD-10-CM | POA: Diagnosis not present

## 2024-04-08 ENCOUNTER — Ambulatory Visit
Admission: RE | Admit: 2024-04-08 | Discharge: 2024-04-08 | Disposition: A | Discharge status: Home / Self Care | Payer: PRIVATE HEALTH INSURANCE | Source: Ambulatory Visit | Attending: Hematology and Oncology

## 2024-04-08 DIAGNOSIS — Z1231 Encounter for screening mammogram for malignant neoplasm of breast: Secondary | ICD-10-CM

## 2024-09-08 DIAGNOSIS — H5213 Myopia, bilateral: Secondary | ICD-10-CM | POA: Diagnosis not present

## 2024-09-08 DIAGNOSIS — H02055 Trichiasis without entropian left lower eyelid: Secondary | ICD-10-CM | POA: Diagnosis not present

## 2024-11-10 DIAGNOSIS — Z1329 Encounter for screening for other suspected endocrine disorder: Secondary | ICD-10-CM | POA: Diagnosis not present

## 2024-11-10 DIAGNOSIS — N951 Menopausal and female climacteric states: Secondary | ICD-10-CM | POA: Diagnosis not present
